# Patient Record
Sex: Male | Born: 1985
Health system: Southern US, Community
[De-identification: ages and names within clinical notes are randomized; demographics above are authoritative.]

## PROBLEM LIST (undated history)

## (undated) DIAGNOSIS — E871 Hypo-osmolality and hyponatremia: Secondary | ICD-10-CM

## (undated) DIAGNOSIS — M549 Dorsalgia, unspecified: Secondary | ICD-10-CM

## (undated) DIAGNOSIS — K922 Gastrointestinal hemorrhage, unspecified: Secondary | ICD-10-CM

## (undated) DIAGNOSIS — M5136 Other intervertebral disc degeneration, lumbar region: Secondary | ICD-10-CM

## (undated) DIAGNOSIS — M51369 Other intervertebral disc degeneration, lumbar region without mention of lumbar back pain or lower extremity pain: Secondary | ICD-10-CM

## (undated) DIAGNOSIS — Z0189 Encounter for other specified special examinations: Secondary | ICD-10-CM

## (undated) DIAGNOSIS — G629 Polyneuropathy, unspecified: Secondary | ICD-10-CM

## (undated) DIAGNOSIS — F1010 Alcohol abuse, uncomplicated: Secondary | ICD-10-CM

## (undated) DIAGNOSIS — U071 COVID-19: Secondary | ICD-10-CM

## (undated) DIAGNOSIS — K921 Melena: Secondary | ICD-10-CM

## (undated) DIAGNOSIS — E119 Type 2 diabetes mellitus without complications: Secondary | ICD-10-CM

## (undated) DIAGNOSIS — E876 Hypokalemia: Secondary | ICD-10-CM

---
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
## 2005-07-09 ENCOUNTER — Emergency Department (HOSPITAL_COMMUNITY): Admission: EM | Admit: 2005-07-09 | Discharge: 2005-07-09 | Payer: Self-pay | Admitting: Emergency Medicine

## 2008-09-22 ENCOUNTER — Ambulatory Visit (HOSPITAL_COMMUNITY): Admission: RE | Admit: 2008-09-22 | Discharge: 2008-09-22 | Payer: Self-pay | Admitting: Family Medicine

## 2009-02-02 ENCOUNTER — Ambulatory Visit (HOSPITAL_COMMUNITY): Admission: RE | Admit: 2009-02-02 | Discharge: 2009-02-02 | Payer: Self-pay | Admitting: Family Medicine

## 2010-05-03 ENCOUNTER — Emergency Department (HOSPITAL_COMMUNITY): Admission: EM | Admit: 2010-05-03 | Discharge: 2010-05-03 | Payer: Self-pay | Admitting: Emergency Medicine

## 2010-12-23 ENCOUNTER — Encounter: Payer: Self-pay | Admitting: Internal Medicine

## 2011-09-24 ENCOUNTER — Other Ambulatory Visit (HOSPITAL_COMMUNITY): Payer: Self-pay | Admitting: Family Medicine

## 2011-09-24 ENCOUNTER — Ambulatory Visit (HOSPITAL_COMMUNITY)
Admission: RE | Admit: 2011-09-24 | Discharge: 2011-09-24 | Disposition: A | Discharge status: Home / Self Care | Payer: BC Managed Care – PPO | Source: Ambulatory Visit | Attending: Family Medicine | Admitting: Family Medicine

## 2011-09-24 DIAGNOSIS — R059 Cough, unspecified: Secondary | ICD-10-CM | POA: Insufficient documentation

## 2011-09-24 DIAGNOSIS — F17200 Nicotine dependence, unspecified, uncomplicated: Secondary | ICD-10-CM | POA: Insufficient documentation

## 2011-09-24 DIAGNOSIS — J069 Acute upper respiratory infection, unspecified: Secondary | ICD-10-CM

## 2011-09-24 DIAGNOSIS — J209 Acute bronchitis, unspecified: Secondary | ICD-10-CM

## 2011-09-24 DIAGNOSIS — R05 Cough: Secondary | ICD-10-CM | POA: Insufficient documentation

## 2011-09-24 DIAGNOSIS — R509 Fever, unspecified: Secondary | ICD-10-CM | POA: Insufficient documentation

## 2011-11-22 ENCOUNTER — Emergency Department (HOSPITAL_COMMUNITY)
Admission: EM | Admit: 2011-11-22 | Discharge: 2011-11-22 | Discharge status: Against Medical Advice | Payer: BC Managed Care – PPO | Attending: Emergency Medicine | Admitting: Emergency Medicine

## 2011-11-22 ENCOUNTER — Encounter: Payer: Self-pay | Admitting: Emergency Medicine

## 2011-11-22 DIAGNOSIS — M549 Dorsalgia, unspecified: Secondary | ICD-10-CM | POA: Insufficient documentation

## 2011-11-22 HISTORY — DX: Dorsalgia, unspecified: M54.9

## 2011-11-22 NOTE — ED Notes (Signed)
Pt c/o chronic back pain. Pt states he needs med refill on hydrocodone 10/325mg .

## 2011-12-17 ENCOUNTER — Encounter (HOSPITAL_COMMUNITY): Payer: Self-pay | Admitting: *Deleted

## 2011-12-17 ENCOUNTER — Emergency Department (HOSPITAL_COMMUNITY)
Admission: EM | Admit: 2011-12-17 | Discharge: 2011-12-17 | Disposition: A | Discharge status: Home / Self Care | Payer: BC Managed Care – PPO | Attending: Emergency Medicine | Admitting: Emergency Medicine

## 2011-12-17 DIAGNOSIS — R509 Fever, unspecified: Secondary | ICD-10-CM | POA: Insufficient documentation

## 2011-12-17 DIAGNOSIS — J329 Chronic sinusitis, unspecified: Secondary | ICD-10-CM | POA: Insufficient documentation

## 2011-12-17 DIAGNOSIS — J45909 Unspecified asthma, uncomplicated: Secondary | ICD-10-CM | POA: Insufficient documentation

## 2011-12-17 DIAGNOSIS — M545 Low back pain, unspecified: Secondary | ICD-10-CM | POA: Insufficient documentation

## 2011-12-17 DIAGNOSIS — J3489 Other specified disorders of nose and nasal sinuses: Secondary | ICD-10-CM | POA: Insufficient documentation

## 2011-12-17 MED ORDER — AMOXICILLIN 500 MG PO CAPS: 500.0000 mg | ORAL_CAPSULE | Freq: Three times a day (TID) | ORAL | Status: AC

## 2011-12-17 MED ORDER — PREDNISONE 20 MG PO TABS: 20.0000 mg | ORAL_TABLET | Freq: Two times a day (BID) | ORAL | Status: DC

## 2011-12-17 MED ORDER — HYDROCODONE-ACETAMINOPHEN 5-325 MG PO TABS: 2.0000 | ORAL_TABLET | Freq: Four times a day (QID) | ORAL | Status: AC | PRN

## 2011-12-17 NOTE — ED Notes (Signed)
C/o sinus pressure to cheeks and c/o pain around eyes, tried Sudafed without relief

## 2011-12-17 NOTE — ED Provider Notes (Signed)
History     CSN: [id number]  Arrival date & time 12/17/11  1609   First MD Initiated Contact with Patient 12/17/11 1626      Chief Complaint  Patient presents with  . Sinusitis  . Back Pain    (Consider location/radiation/quality/duration/timing/severity/associated sxs/prior treatment) Patient is a 26 y.o. male presenting with sinusitis and back pain. The history is provided by the patient (the patient complains of about 3 or 4 day history of severe sinus pressure and pain. The pain is in his left maxillary sinus area. He also states that he's run out of his pain medicine for his back and in his in the process of changing physicians    ).  Sinusitis  This is a recurrent problem. The current episode started more than 2 days ago. The problem has not changed since onset.The maximum temperature recorded prior to his arrival was 100 to 100.9 F. The pain is at a severity of 5/10. The pain is moderate. The pain has been constant since onset. Associated symptoms include congestion. Pertinent negatives include no chills, no sweats, no sinus pressure and no cough. He has tried nothing for the symptoms. The treatment provided no relief.  Back Pain  Pertinent negatives include no chest pain, no headaches and no abdominal pain.    Past Medical History  Diagnosis Date  . Back pain   . Asthma     History reviewed. No pertinent past surgical history.  History reviewed. No pertinent family history.  History  Substance Use Topics  . Smoking status: Current Everyday Smoker -- 1.0 packs/day    Types: Cigarettes  . Smokeless tobacco: Not on file  . Alcohol Use: No      Review of Systems  Constitutional: Negative for chills and fatigue.  HENT: Positive for congestion. Negative for sinus pressure and ear discharge.        Sinus congestion  Eyes: Negative for discharge.  Respiratory: Negative for cough.   Cardiovascular: Negative for chest pain.  Gastrointestinal: Negative for abdominal  pain and diarrhea.  Genitourinary: Negative for frequency and hematuria.  Musculoskeletal: Positive for back pain.  Skin: Negative for rash.  Neurological: Negative for seizures and headaches.  Hematological: Negative.   Psychiatric/Behavioral: Negative for hallucinations.    Allergies  Review of patient's allergies indicates no known allergies.  Home Medications   Current Outpatient Rx  Name Route Sig Dispense Refill  . AMOXICILLIN 500 MG PO CAPS Oral Take 1 capsule (500 mg total) by mouth 3 (three) times daily. 42 capsule 0  . HYDROCODONE-ACETAMINOPHEN 5-325 MG PO TABS Oral Take 2 tablets by mouth every 6 (six) hours as needed for pain. 20 tablet 0  . PREDNISONE 20 MG PO TABS Oral Take 1 tablet (20 mg total) by mouth 2 (two) times daily. 14 tablet 0    BP 119/77  Pulse 93  Temp(Src) 98 F (36.7 C) (Oral)  Resp 20  Ht 5\' 11"  (1.803 m)  Wt 130 lb (58.968 kg)  BMI 18.13 kg/m2  SpO2 100%  Physical Exam  Constitutional: He is oriented to person, place, and time. He appears well-developed.  HENT:  Head: Normocephalic and atraumatic.       Tender left maxillary sinus  Eyes: Conjunctivae and EOM are normal. No scleral icterus.  Neck: Neck supple. No thyromegaly present.  Cardiovascular: Normal rate and regular rhythm.  Exam reveals no gallop and no friction rub.   No murmur heard. Pulmonary/Chest: No stridor. He has no wheezes. He has no  rales. He exhibits no tenderness.  Abdominal: He exhibits no distension. There is no tenderness. There is no rebound.  Musculoskeletal: Normal range of motion. He exhibits tenderness. He exhibits no edema.       Lumbar muscles mildly tender  Lymphadenopathy:    He has no cervical adenopathy.  Neurological: He is oriented to person, place, and time. Coordination normal.  Skin: No rash noted. No erythema.  Psychiatric: He has a normal mood and affect. His behavior is normal.    ED Course  Procedures (including critical care time)  Labs  Reviewed - No data to display No results found.   1. Sinusitis    Back pain   MDM          Benny Lennert, MD 12/17/11 1644

## 2012-01-06 ENCOUNTER — Emergency Department (HOSPITAL_COMMUNITY)
Admission: EM | Admit: 2012-01-06 | Discharge: 2012-01-06 | Disposition: A | Discharge status: Home / Self Care | Payer: BC Managed Care – PPO | Attending: Emergency Medicine | Admitting: Emergency Medicine

## 2012-01-06 ENCOUNTER — Encounter (HOSPITAL_COMMUNITY): Payer: Self-pay | Admitting: *Deleted

## 2012-01-06 DIAGNOSIS — J45909 Unspecified asthma, uncomplicated: Secondary | ICD-10-CM | POA: Insufficient documentation

## 2012-01-06 DIAGNOSIS — F17200 Nicotine dependence, unspecified, uncomplicated: Secondary | ICD-10-CM | POA: Insufficient documentation

## 2012-01-06 DIAGNOSIS — J329 Chronic sinusitis, unspecified: Secondary | ICD-10-CM | POA: Insufficient documentation

## 2012-01-06 MED ORDER — AMOXICILLIN-POT CLAVULANATE 875-125 MG PO TABS: 1.0000 | ORAL_TABLET | Freq: Two times a day (BID) | ORAL | Status: AC

## 2012-01-06 MED ORDER — FLUTICASONE PROPIONATE 50 MCG/ACT NA SUSP: 2.0000 | Freq: Every day | NASAL | Status: DC

## 2012-01-06 MED ORDER — HYDROCODONE-ACETAMINOPHEN 5-325 MG PO TABS
1.0000 | ORAL_TABLET | Freq: Once | ORAL | Status: AC
  Administered 2012-01-06: 1 via ORAL
  Filled 2012-01-06: qty 1

## 2012-01-06 MED ORDER — HYDROCODONE-ACETAMINOPHEN 5-325 MG PO TABS: 1.0000 | ORAL_TABLET | Freq: Four times a day (QID) | ORAL | Status: AC | PRN

## 2012-01-06 MED ORDER — AMOXICILLIN-POT CLAVULANATE 875-125 MG PO TABS
1.0000 | ORAL_TABLET | Freq: Once | ORAL | Status: AC
  Administered 2012-01-06: 1 via ORAL
  Filled 2012-01-06: qty 1

## 2012-01-06 NOTE — ED Notes (Signed)
Pain lt side of face for 2 days, says he felt like this when he has sinus infection.  Alert, NAD

## 2012-01-06 NOTE — ED Notes (Signed)
Facial pain, states he was treated for sinus infection recently

## 2012-01-06 NOTE — ED Provider Notes (Signed)
History     CSN: [id number]  Arrival date & time 01/06/12  1826   First MD Initiated Contact with Patient 01/06/12 1944      Chief Complaint  Patient presents with  . Sinusitis  . Facial Pain    (Consider location/radiation/quality/duration/timing/severity/associated sxs/prior treatment) HPI Comments: Was recently treated for a sinus infection with amoxicillin and "kind of got better"  sxs are now recurrin.  Pain especially in L max sinus area.  Patient is a 26 y.o. male presenting with sinusitis. The history is provided by the patient. No language interpreter was used.  Sinusitis  This is a new problem. The problem has been gradually worsening. There has been no fever. The pain is moderate. The pain has been constant since onset. Associated symptoms include sinus pressure. Pertinent negatives include no cough.    Past Medical History  Diagnosis Date  . Back pain   . Asthma     History reviewed. No pertinent past surgical history.  No family history on file.  History  Substance Use Topics  . Smoking status: Current Everyday Smoker -- 1.0 packs/day    Types: Cigarettes  . Smokeless tobacco: Not on file  . Alcohol Use: No      Review of Systems  Constitutional: Positive for fever.  HENT: Positive for sinus pressure.   Respiratory: Negative for cough and wheezing.   All other systems reviewed and are negative.    Allergies  Review of patient's allergies indicates no known allergies.  Home Medications   Current Outpatient Rx  Name Route Sig Dispense Refill  . ACETAMINOPHEN 500 MG PO TABS Oral Take 500 mg by mouth every 6 (six) hours as needed. For pain      BP 115/72  Pulse 89  Temp(Src) 98.6 F (37 C) (Oral)  Resp 20  Ht 5\' 7"  (1.702 m)  Wt 130 lb (58.968 kg)  BMI 20.36 kg/m2  SpO2 98%  Physical Exam  Nursing note and vitals reviewed. Constitutional: He is oriented to person, place, and time. He appears well-developed and well-nourished.  HENT:    Head: Normocephalic and atraumatic.    Right Ear: External ear normal.  Left Ear: External ear normal.  Mouth/Throat: Oropharynx is clear and moist. No oropharyngeal exudate.  Eyes: Conjunctivae and EOM are normal.  Neck: Normal range of motion.  Cardiovascular: Normal rate, regular rhythm, normal heart sounds and intact distal pulses.   Pulmonary/Chest: Effort normal and breath sounds normal. No respiratory distress. He has no wheezes. He has no rales. He exhibits no tenderness.  Abdominal: Soft. He exhibits no distension. There is no tenderness.  Musculoskeletal: Normal range of motion.  Neurological: He is alert and oriented to person, place, and time. He has normal strength. No cranial nerve deficit or sensory deficit. GCS eye subscore is 4. GCS verbal subscore is 5. GCS motor subscore is 6.  Skin: Skin is warm and dry.  Psychiatric: He has a normal mood and affect. Judgment normal.    ED Course  Procedures (including critical care time)  Labs Reviewed - No data to display No results found.   No diagnosis found.    MDM          Worthy Rancher, PA 01/06/12 [phone]

## 2012-01-07 NOTE — ED Provider Notes (Signed)
Medical screening examination/treatment/procedure(s) were performed by non-physician practitioner and as supervising physician I was immediately available for consultation/collaboration.   Vernella Niznik W Gottfried Standish, MD 01/07/12 0012 

## 2012-02-10 ENCOUNTER — Encounter (HOSPITAL_COMMUNITY): Payer: Self-pay | Admitting: *Deleted

## 2012-02-10 ENCOUNTER — Emergency Department (HOSPITAL_COMMUNITY)
Admission: EM | Admit: 2012-02-10 | Discharge: 2012-02-10 | Disposition: A | Discharge status: Home / Self Care | Payer: BC Managed Care – PPO | Attending: Emergency Medicine | Admitting: Emergency Medicine

## 2012-02-10 DIAGNOSIS — F17200 Nicotine dependence, unspecified, uncomplicated: Secondary | ICD-10-CM | POA: Insufficient documentation

## 2012-02-10 DIAGNOSIS — R51 Headache: Secondary | ICD-10-CM | POA: Insufficient documentation

## 2012-02-10 DIAGNOSIS — J329 Chronic sinusitis, unspecified: Secondary | ICD-10-CM

## 2012-02-10 MED ORDER — AMOXICILLIN-POT CLAVULANATE 875-125 MG PO TABS: 1.0000 | ORAL_TABLET | Freq: Two times a day (BID) | ORAL | Status: AC

## 2012-02-10 MED ORDER — HYDROCODONE-ACETAMINOPHEN 5-325 MG PO TABS: 1.0000 | ORAL_TABLET | ORAL | Status: AC | PRN

## 2012-02-10 NOTE — ED Notes (Signed)
Seen 02/03/12 for sinus infection, given abx.  Reports got better, but now c/o increased pressure in eyes/head/nose with swelling under left eye x 3 days.

## 2012-02-10 NOTE — ED Provider Notes (Signed)
History     CSN: [id number]  Arrival date & time 02/10/12  4098   First MD Initiated Contact with Patient 02/10/12 1250      Chief Complaint  Patient presents with  . Facial Swelling   HPI Shane Holmes is a 26 y.o. male who presents to the ED for sinus headache. Onset of symptoms 3 days ago with nasal congestion and pain around left eye. Hx of sinusitis in the past and makes it feel like teeth are hurting. The history was provided by the patient.  Past Medical History  Diagnosis Date  . Back pain   . Asthma     History reviewed. No pertinent past surgical history.  No family history on file.  History  Substance Use Topics  . Smoking status: Current Everyday Smoker -- 1.0 packs/day    Types: Cigarettes  . Smokeless tobacco: Not on file  . Alcohol Use: No      Review of Systems  Constitutional: Negative for fever, chills, diaphoresis and fatigue.  HENT: Positive for congestion and sinus pressure. Negative for ear pain, sore throat, facial swelling, neck pain, neck stiffness and dental problem.   Eyes: Negative for photophobia, pain and discharge.  Respiratory: Negative for cough, chest tightness and wheezing.   Cardiovascular: Negative.   Gastrointestinal: Negative for nausea, vomiting, abdominal pain, diarrhea, constipation and abdominal distention.  Genitourinary: Negative for dysuria, frequency, flank pain and difficulty urinating.  Musculoskeletal: Positive for back pain. Negative for myalgias and gait problem.  Skin: Negative for color change and rash.  Neurological: Positive for headaches. Negative for dizziness, speech difficulty, weakness, light-headedness and numbness.  Psychiatric/Behavioral: Negative for confusion and agitation. The patient is not nervous/anxious.     Allergies  Review of patient's allergies indicates no known allergies.  Home Medications   Current Outpatient Rx  Name Route Sig Dispense Refill  . ACETAMINOPHEN 500 MG PO TABS Oral Take  500 mg by mouth every 6 (six) hours as needed. For pain    . FLUTICASONE PROPIONATE 50 MCG/ACT NA SUSP Nasal Place 2 sprays into the nose daily. 1 g 0    BP 126/82  Pulse 91  Temp(Src) 97.9 F (36.6 C) (Oral)  Resp 16  Ht 5\' 11"  (1.803 m)  Wt 135 lb (61.236 kg)  BMI 18.83 kg/m2  SpO2 100%  Physical Exam  Nursing note and vitals reviewed. Constitutional: He is oriented to person, place, and time. He appears well-developed and well-nourished. No distress.  HENT:  Head: Normocephalic.    Right Ear: Tympanic membrane normal.  Left Ear: Tympanic membrane normal.  Nose: Mucosal edema present.  Mouth/Throat: Uvula is midline and oropharynx is clear and moist.       Left maxillary sinus tenderness with palpation.  Eyes: Conjunctivae, EOM and lids are normal. Pupils are equal, round, and reactive to light.  Neck: Trachea normal and normal range of motion. Neck supple.  Cardiovascular: Normal rate.   Pulmonary/Chest: Effort normal.  Abdominal: Soft. There is no tenderness.  Musculoskeletal: Normal range of motion.  Neurological: He is alert and oriented to person, place, and time. No cranial nerve deficit.  Skin: Skin is warm and dry.  Psychiatric: He has a normal mood and affect. His behavior is normal. Judgment and thought content normal.   Assessment: Sinusitis  Plan:  Augmentin Rx   Hydrocodone Rx   Follow up with PCP  ED Course  Procedures    MDM          [facility]  Orlene Och, NP 02/10/12 1303

## 2012-02-10 NOTE — Discharge Instructions (Signed)
Sinusitis Sinusitis an infection of the air pockets (sinuses) in your face. This can cause puffiness (swelling). It can also cause drainage from your sinuses.   HOME CARE    Only take medicine as told by your doctor.   Drink enough fluids to keep your pee (urine) clear or pale yellow.   Apply moist heat or ice packs for pain relief.   Use salt (saline) nose sprays. The spray will wet the thick fluid in the nose. This can help the sinuses drain.  GET HELP RIGHT AWAY IF:    You have a fever.   Your baby is older than 3 months with a rectal temperature of 102 F (38.9 C) or higher.   Your baby is 3 months old or younger with a rectal temperature of 100.4 F (38 C) or higher.   The pain gets worse.   You get a very bad headache.   You keep throwing up (vomiting).   Your face gets puffy.  MAKE SURE YOU:    Understand these instructions.   Will watch your condition.   Will get help right away if you are not doing well or get worse.  Document Released: 05/06/2008 Document Revised: 11/07/2011 Document Reviewed: 05/06/2008 [employer] Patient Information 2012 [employer]. 

## 2012-02-10 NOTE — ED Provider Notes (Signed)
Medical screening examination/treatment/procedure(s) were performed by non-physician practitioner and as supervising physician I was immediately available for consultation/collaboration.   Dayton Bailiff, MD 02/10/12 1505

## 2012-03-09 ENCOUNTER — Emergency Department (HOSPITAL_COMMUNITY): Payer: BC Managed Care – PPO

## 2012-03-09 ENCOUNTER — Emergency Department (HOSPITAL_COMMUNITY)
Admission: EM | Admit: 2012-03-09 | Discharge: 2012-03-09 | Disposition: A | Discharge status: Home / Self Care | Payer: BC Managed Care – PPO | Attending: Emergency Medicine | Admitting: Emergency Medicine

## 2012-03-09 ENCOUNTER — Encounter (HOSPITAL_COMMUNITY): Payer: Self-pay | Admitting: *Deleted

## 2012-03-09 DIAGNOSIS — S5000XA Contusion of unspecified elbow, initial encounter: Secondary | ICD-10-CM | POA: Insufficient documentation

## 2012-03-09 DIAGNOSIS — M25529 Pain in unspecified elbow: Secondary | ICD-10-CM | POA: Insufficient documentation

## 2012-03-09 DIAGNOSIS — F17200 Nicotine dependence, unspecified, uncomplicated: Secondary | ICD-10-CM | POA: Insufficient documentation

## 2012-03-09 DIAGNOSIS — Y9372 Activity, wrestling: Secondary | ICD-10-CM | POA: Insufficient documentation

## 2012-03-09 DIAGNOSIS — S5001XA Contusion of right elbow, initial encounter: Secondary | ICD-10-CM

## 2012-03-09 DIAGNOSIS — W1809XA Striking against other object with subsequent fall, initial encounter: Secondary | ICD-10-CM | POA: Insufficient documentation

## 2012-03-09 MED ORDER — HYDROCODONE-ACETAMINOPHEN 5-325 MG PO TABS: 1.0000 | ORAL_TABLET | ORAL | Status: DC | PRN

## 2012-03-09 MED ORDER — IBUPROFEN 600 MG PO TABS: 600.0000 mg | ORAL_TABLET | Freq: Four times a day (QID) | ORAL | Status: DC | PRN

## 2012-03-09 NOTE — ED Provider Notes (Signed)
History     CSN: [id number]  Arrival date & time 03/09/12  1810   First MD Initiated Contact with Patient 03/09/12 1832      Chief Complaint  Patient presents with  . Arm Pain    (Consider location/radiation/quality/duration/timing/severity/associated sxs/prior treatment) Patient is a 26 y.o. male presenting with arm injury. The history is provided by the patient.  Arm Injury  Episode onset: 3 days ago. The incident occurred in the street. The injury mechanism was a fall. Context: Patient was playing wrestling with a friend when he fell backward hitting the ground with his right elbow and his friend fell across his forearm.  This injury occurred 3 days ago and he continues to have pain and bruising at the elbow. There is an injury to the right elbow. The pain is severe. Pertinent negatives include no chest pain, no numbness, no abdominal pain, no nausea, no headaches, no neck pain, no light-headedness and no weakness. He is right-handed.    Past Medical History  Diagnosis Date  . Back pain   . Asthma     History reviewed. No pertinent past surgical history.  History reviewed. No pertinent family history.  History  Substance Use Topics  . Smoking status: Current Everyday Smoker -- 1.0 packs/day    Types: Cigarettes  . Smokeless tobacco: Not on file  . Alcohol Use: No      Review of Systems  Constitutional: Negative for fever.  HENT: Negative for congestion, sore throat and neck pain.   Eyes: Negative.   Respiratory: Negative for chest tightness and shortness of breath.   Cardiovascular: Negative for chest pain.  Gastrointestinal: Negative for nausea and abdominal pain.  Genitourinary: Negative.   Musculoskeletal: Positive for arthralgias. Negative for joint swelling.  Skin: Negative.  Negative for rash and wound.  Neurological: Negative for dizziness, weakness, light-headedness, numbness and headaches.  Hematological: Negative.   Psychiatric/Behavioral: Negative.      Allergies  Review of patient's allergies indicates no known allergies.  Home Medications   Current Outpatient Rx  Name Route Sig Dispense Refill  . IBUPROFEN 200 MG PO TABS Oral Take 800 mg by mouth as needed. FOR PAIN    . HYDROCODONE-ACETAMINOPHEN 5-325 MG PO TABS Oral Take 1 tablet by mouth every 4 (four) hours as needed for pain. 15 tablet 0  . IBUPROFEN 600 MG PO TABS Oral Take 1 tablet (600 mg total) by mouth every 6 (six) hours as needed for pain. 30 tablet 0    BP 141/97  Pulse 109  Temp(Src) 98.4 F (36.9 C) (Oral)  Resp 18  Ht 5\' 11"  (1.803 m)  Wt 135 lb (61.236 kg)  BMI 18.83 kg/m2  SpO2 100%  Physical Exam  Nursing note and vitals reviewed. Constitutional: He is oriented to person, place, and time. He appears well-developed and well-nourished.  HENT:  Head: Normocephalic.  Eyes: Conjunctivae are normal.  Neck: Normal range of motion.  Cardiovascular: Normal rate and intact distal pulses.  Exam reveals no decreased pulses.   Pulmonary/Chest: Effort normal.  Musculoskeletal: He exhibits tenderness. He exhibits no edema.       Right elbow: He exhibits decreased range of motion and swelling. He exhibits no effusion and no deformity. tenderness found. Medial epicondyle, lateral epicondyle and olecranon process tenderness noted.       Ecchymosis surrounding lateral and posterior elbow.  Neurological: He is alert and oriented to person, place, and time. No sensory deficit.  Skin: Skin is warm, dry and intact.  ED Course  Procedures (including critical care time)  Labs Reviewed - No data to display Dg Elbow Complete Right  03/09/2012  *RADIOLOGY REPORT*  Clinical Data: Pain, injury  RIGHT ELBOW - COMPLETE 3+ VIEW  Comparison: None.  Findings: Normal alignment without fracture or effusion.  Preserved joint spaces.  IMPRESSION: No acute finding.  Original Report Authenticated By: Judie Petit. TREVOR Miles Costain, M.D.     1. Contusion of right elbow       MDM  Splint and  Ace wrap applied.  Ibuprofen, hydrocodone, encouraged to continue using ice packs.  Will refer to Dr. Hilda Lias if his pain symptoms are not improved over the next week.  Work note given for light duty for the next week.  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        Candis Musa, PA 03/09/12 1939

## 2012-03-09 NOTE — Discharge Instructions (Signed)
Elbow Contusion Contusions are areas of tenderness and swelling in the soft tissues. They are the result of damage and bleeding in the injured area. Minor injuries will give you a painless bruise, but more severe contusions may stay painful and swollen for a few weeks. You have a deep bruise (contusion) of your elbow. SYMPTOMS  A hematoma may form in large contusions. A hematoma is a collection of blood in the deep tissues. Hematomas are usually reabsorbed by the body naturally. Sometimes they need to be drained.  TREATMENT  Only a sling or splint may be needed for a brief period of time or as directed by your caregiver.  HOME CARE INSTRUCTIONS   Only take over-the-counter or prescription medicines for pain, discomfort, or fever as directed by your caregiver.   Rest the injured elbow until the pain and swelling are better.   Elevate the injury to reduce swelling.   Compression bandages also help reduce swelling and motion.   If you have a splint held on with an elastic wrap or a cast, watch your hand or fingers. If they become numb or cold and blue, loosen the wrap and reapply more loosely. See your caregiver if there is no relief.   You may use ice on your elbow for 20 minutes, 4 times per day, for the first 2 to 3 days.   Use your elbow as directed.   See your caregiver as directed. It is very important to keep all follow-up referrals and appointments in order to avoid any long-term problems with your elbow including chronic pain or inability to move the elbow normally.  SEEK IMMEDIATE MEDICAL CARE IF:   You show signs of infection (increased redness, swelling, or pain).   Swelling or increased pain in your elbow is not relieved with medications.   You have pain or inability to move your fingers or wrist.   You begin to lose feeling in your hand or fingers, or you develop swelling of the hand and fingers.   You get a cold or blue hand or fingers on the injured side.   If your  elbow remains sore, your caregiver may want to X-ray it again. A hairline fracture may not show up on the first X-rays and may only be seen on X-rays 10 days to 2 weeks later. A specialist (radiologist) may examine your X-rays at a later time. In order to get results from the X-rays, make sure you know how and when you are to get that information. It is your responsibility to get results of any tests you may have had.   Make sure you have a follow up exam with your caregiver.  MAKE SURE YOU:   Understand these instructions.   Will watch your condition.   Will get help right away if you are not doing well or get worse.  Document Released: 10/27/2006 Document Revised: 11/07/2011 Document Reviewed: 10/04/2011 [facility] Patient Information 2012 [place], [place].   You may take the hydrocodone as prescribed for pain relief.  This will make you drowsy - do not drive within 4 hours of taking this medication. Wear the Ace wrap and use a sling for comfort.  Continue using ice.  As discussed, get rechecked if your symptoms are not improving over the next week.  Your x-rays negative today.
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
## 2012-03-10 NOTE — ED Provider Notes (Signed)
Medical screening examination/treatment/procedure(s) were performed by non-physician practitioner and as supervising physician I was immediately available for consultation/collaboration. Devoria Albe, MD, FACEP   Ward Givens, MD 03/10/12 [phone]

## 2012-03-16 ENCOUNTER — Encounter (HOSPITAL_COMMUNITY): Payer: Self-pay | Admitting: *Deleted

## 2012-03-16 ENCOUNTER — Emergency Department (HOSPITAL_COMMUNITY): Payer: BC Managed Care – PPO

## 2012-03-16 ENCOUNTER — Emergency Department (HOSPITAL_COMMUNITY)
Admission: EM | Admit: 2012-03-16 | Discharge: 2012-03-16 | Disposition: A | Discharge status: Home / Self Care | Payer: BC Managed Care – PPO | Attending: Emergency Medicine | Admitting: Emergency Medicine

## 2012-03-16 DIAGNOSIS — M778 Other enthesopathies, not elsewhere classified: Secondary | ICD-10-CM | POA: Insufficient documentation

## 2012-03-16 DIAGNOSIS — M79609 Pain in unspecified limb: Secondary | ICD-10-CM | POA: Insufficient documentation

## 2012-03-16 DIAGNOSIS — M79601 Pain in right arm: Secondary | ICD-10-CM

## 2012-03-16 DIAGNOSIS — F17200 Nicotine dependence, unspecified, uncomplicated: Secondary | ICD-10-CM | POA: Insufficient documentation

## 2012-03-16 DIAGNOSIS — R5381 Other malaise: Secondary | ICD-10-CM | POA: Insufficient documentation

## 2012-03-16 DIAGNOSIS — IMO0001 Reserved for inherently not codable concepts without codable children: Secondary | ICD-10-CM | POA: Insufficient documentation

## 2012-03-16 DIAGNOSIS — Z87828 Personal history of other (healed) physical injury and trauma: Secondary | ICD-10-CM | POA: Insufficient documentation

## 2012-03-16 DIAGNOSIS — M25529 Pain in unspecified elbow: Secondary | ICD-10-CM | POA: Insufficient documentation

## 2012-03-16 MED ORDER — TRAMADOL HCL 50 MG PO TABS: 50.0000 mg | ORAL_TABLET | Freq: Four times a day (QID) | ORAL | Status: AC | PRN

## 2012-03-16 MED ORDER — PREDNISONE 10 MG PO TABS: ORAL_TABLET | ORAL | Status: DC

## 2012-03-16 NOTE — Discharge Instructions (Signed)
Tendinitis  Tendinitis is redness, soreness, and puffiness (inflammation) of the tendons. Tendons are band-like tissues that connect muscle to bone. Tendinitis often happens in the shoulders, heels, or elbows. It might happen if your job involves doing the same motions over and over. HOME CARE  Use a sling or splint as told by your doctor.   Put ice on the injured area.   Put ice in a plastic bag.   Place a towel between your skin and the bag.   Leave the ice on for 15 to 20 minutes, 3 to 4 times a day.   Avoid using your injured arm or leg until the pain goes away.   Do gentle exercises only as told by your doctor. Stop exercises if the pain gets worse, unless your doctor tells you otherwise.   Only take medicines as told by your doctor.  GET HELP RIGHT AWAY IF:  Your pain and puffiness get worse.   You have new problems, such as loss of feeling (numbness) in the hands.  MAKE SURE YOU:  Understand these instructions.   Will watch your condition.   Will get help right away if you are not doing well or get worse.  Document Released: 02/28/2011 Document Revised: 11/07/2011 Document Reviewed: 02/28/2011 [name] Patient Information 2012 [place], [place].  Use the prednisone taper in place of ibuprofen.  ACE your elbow since his discomfort but I would my use of your sling time can actually cause increased weakness and muscle atrophy in your arm shoulder area.  You need to call Dr. Hilda Lias for further management of your pain if it persists.

## 2012-03-16 NOTE — ED Notes (Signed)
Pain rt arm, esp at elbow, Seen here for same..  Wearing a sling on arrival.

## 2012-03-16 NOTE — ED Provider Notes (Signed)
Medical screening examination/treatment/procedure(s) were performed by non-physician practitioner and as supervising physician I was immediately available for consultation/collaboration.  Donnetta Hutching, MD 03/16/12 [phone]
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
## 2012-03-16 NOTE — ED Provider Notes (Signed)
History     CSN: [id number]  Arrival date & time 03/16/12  1057   First MD Initiated Contact with Patient 03/16/12 1320      Chief Complaint  Patient presents with  . Arm Pain    (Consider location/radiation/quality/duration/timing/severity/associated sxs/prior treatment) HPI Comments: Patient was seen here one week ago for evaluation of right elbow pain an injury when he bowel and a friend fell on top of him jamming his elbow into the pavement.  X-rays at that time were negative.  He continues to have pain.  He was referred to Dr. Hilda Lias if his pain persisted, but he has not called for this appointment yet.  Patient is a 26 y.o. male presenting with arm pain. The history is provided by the patient.  Arm Pain This is a recurrent problem. Episode onset: 10 days ago. The problem occurs constantly. The problem has been unchanged. Associated symptoms include arthralgias, myalgias and weakness. Pertinent negatives include no fatigue, fever, headaches, joint swelling, nausea or numbness. The symptoms are aggravated by twisting and bending (Palpation). Treatments tried: He has been wearing a sling and Ace wrap and has completed a course of ibuprofen and hydrocodone without relief of his pain. The treatment provided no relief.    Past Medical History  Diagnosis Date  . Back pain   . Asthma     History reviewed. No pertinent past surgical history.  History reviewed. No pertinent family history.  History  Substance Use Topics  . Smoking status: Current Everyday Smoker -- 1.0 packs/day    Types: Cigarettes  . Smokeless tobacco: Not on file  . Alcohol Use: No      Review of Systems  Constitutional: Negative for fever and fatigue.  Gastrointestinal: Negative for nausea.  Musculoskeletal: Positive for myalgias and arthralgias. Negative for joint swelling.  Neurological: Positive for weakness. Negative for numbness and headaches.    Allergies  Review of patient's allergies  indicates no known allergies.  Home Medications   Current Outpatient Rx  Name Route Sig Dispense Refill  . IBUPROFEN 200 MG PO TABS Oral Take 600 mg by mouth as needed. FOR PAIN      BP 121/75  Pulse 68  Temp(Src) 98 F (36.7 C) (Oral)  Resp 18  Ht 5\' 11"  (1.803 m)  Wt 135 lb (61.236 kg)  BMI 18.83 kg/m2  SpO2 100%  Physical Exam  Nursing note and vitals reviewed. Constitutional: He is oriented to person, place, and time. He appears well-developed and well-nourished.  HENT:  Head: Normocephalic.  Eyes: Conjunctivae are normal.  Neck: Normal range of motion.  Cardiovascular: Normal rate and intact distal pulses.  Exam reveals no decreased pulses.   Pulses:      Dorsalis pedis pulses are 2+ on the right side, and 2+ on the left side.       Posterior tibial pulses are 2+ on the right side, and 2+ on the left side.  Pulmonary/Chest: Effort normal.  Musculoskeletal: He exhibits tenderness. He exhibits no edema.       Right elbow: He exhibits normal range of motion, no swelling, no effusion, no deformity and no laceration. tenderness found. Olecranon process tenderness noted.       Right ankle: He exhibits normal pulse.       Also with tenderness to palpation to the mid posterior humeral shaft.  No muscle spasm, ecchymosis or hematoma appreciated.  No deformity appreciated.  Patient has increased pain in his posterior upper arm with attempts at resisted extension.  Neurological: He is alert and oriented to person, place, and time. No sensory deficit.  Skin: Skin is warm, dry and intact.    ED Course  Procedures (including critical care time)  Labs Reviewed - No data to display Dg Humerus Right  03/16/2012  *RADIOLOGY REPORT*  Clinical Data: Arm pain  RIGHT HUMERUS - 2+ VIEW  Comparison: 03/09/12  Findings: .  Four views of the right humerus submitted.  No acute fracture or subluxation.  No posterior fat pad sign.  No periosteal reaction or bony erosion.  IMPRESSION: No acute  fracture or subluxation.  Original Report Authenticated By: Natasha Mead, M.D.     1. Right arm pain       MDM  Patient presents with continuing pain after trauma to his right elbow and right upper arm despite normal x-ray findings.  He will once again be referred to orthopedics for further management if his pain persists.  Encouraged to continue using Ace wrap but discouraged continued use of his sling.  Will try prednisone taper in place of his ibuprofen and will prescribe tramadol for pain relief.  Suspect he may have tricep tendon injury or tendinitis.  No evidence for tendon rupture.  Urged to call Dr. Hilda Lias for further management.        Candis Musa, PA 03/16/12 1445

## 2012-12-20 ENCOUNTER — Emergency Department (HOSPITAL_COMMUNITY)
Admission: EM | Admit: 2012-12-20 | Discharge: 2012-12-20 | Disposition: A | Discharge status: Home / Self Care | Payer: Self-pay | Attending: Emergency Medicine | Admitting: Emergency Medicine

## 2012-12-20 ENCOUNTER — Emergency Department (HOSPITAL_COMMUNITY): Payer: Self-pay

## 2012-12-20 ENCOUNTER — Encounter (HOSPITAL_COMMUNITY): Payer: Self-pay | Admitting: Family Medicine

## 2012-12-20 DIAGNOSIS — Z79899 Other long term (current) drug therapy: Secondary | ICD-10-CM | POA: Insufficient documentation

## 2012-12-20 DIAGNOSIS — R071 Chest pain on breathing: Secondary | ICD-10-CM | POA: Insufficient documentation

## 2012-12-20 DIAGNOSIS — R05 Cough: Secondary | ICD-10-CM | POA: Insufficient documentation

## 2012-12-20 DIAGNOSIS — R059 Cough, unspecified: Secondary | ICD-10-CM | POA: Insufficient documentation

## 2012-12-20 DIAGNOSIS — F17200 Nicotine dependence, unspecified, uncomplicated: Secondary | ICD-10-CM | POA: Insufficient documentation

## 2012-12-20 DIAGNOSIS — IMO0001 Reserved for inherently not codable concepts without codable children: Secondary | ICD-10-CM | POA: Insufficient documentation

## 2012-12-20 DIAGNOSIS — J45909 Unspecified asthma, uncomplicated: Secondary | ICD-10-CM | POA: Insufficient documentation

## 2012-12-20 DIAGNOSIS — R0789 Other chest pain: Secondary | ICD-10-CM

## 2012-12-20 DIAGNOSIS — Z791 Long term (current) use of non-steroidal anti-inflammatories (NSAID): Secondary | ICD-10-CM | POA: Insufficient documentation

## 2012-12-20 MED ORDER — HYDROCODONE-ACETAMINOPHEN 7.5-325 MG/15ML PO SOLN: 10.0000 mL | Freq: Four times a day (QID) | ORAL | Status: DC | PRN

## 2012-12-20 MED ORDER — HYDROCOD POLST-CHLORPHEN POLST 10-8 MG/5ML PO LQCR
5.0000 mL | Freq: Once | ORAL | Status: AC
  Administered 2012-12-20: 5 mL via ORAL
  Filled 2012-12-20: qty 5

## 2012-12-20 NOTE — ED Notes (Addendum)
Per pt sts right rib pain x 5 days. Recently got over a cold and has been coughing a lot. sts hurts to move and take a deep breath.

## 2012-12-20 NOTE — ED Notes (Signed)
Patient transported to X-ray 

## 2012-12-20 NOTE — ED Provider Notes (Signed)
History     CSN: [id number]  Arrival date & time 12/20/12  1220   First MD Initiated Contact with Patient 12/20/12 1247      Chief Complaint  Patient presents with  . Rib Injury    (Consider location/radiation/quality/duration/timing/severity/associated sxs/prior treatment) HPI 27 year old caucasian male with history of asthma and smoking complaining of right sided rib pain and persistent cough x 5 days occuring after resolution of viral illness. Pain is in right lower ribs running from sternum to back. Described as sharp 10/10 at worst. Is aggrivated with movement, deep breathing, and coughing. Was relieved with Vicodin. Denies SOB, wheezing, leg swelling, nausea, or vomiting.  Past Medical History  Diagnosis Date  . Back pain   . Asthma     History reviewed. No pertinent past surgical history.  History reviewed. No pertinent family history.  History  Substance Use Topics  . Smoking status: Current Every Day Smoker -- 1.0 packs/day    Types: Cigarettes  . Smokeless tobacco: Not on file  . Alcohol Use: No      Review of Systems  Constitutional: Negative for fever.  Respiratory: Positive for cough. Negative for chest tightness and shortness of breath.   Cardiovascular: Positive for chest pain.  Gastrointestinal: Negative for nausea and abdominal pain.  Musculoskeletal: Positive for myalgias.       Severe pain to lower ribs on right side that is aggravated with movement.    Allergies  Review of patient's allergies indicates no known allergies.  Home Medications   Current Outpatient Rx  Name  Route  Sig  Dispense  Refill  . ACETAMINOPHEN 500 MG PO TABS   Oral   Take 1,500-2,000 mg by mouth 2 (two) times daily as needed. For pain         . ALBUTEROL SULFATE HFA 108 (90 BASE) MCG/ACT IN AERS   Inhalation   Inhale 2 puffs into the lungs every 6 (six) hours as needed. For shortness of breath or wheezing         . IBUPROFEN 200 MG PO TABS   Oral   Take  600-800 mg by mouth 3 (three) times daily as needed. For pain           BP 125/94  Pulse 67  Temp 98.1 F (36.7 C)  Resp 18  SpO2 100%  Physical Exam  Constitutional: He appears well-developed and well-nourished.  Cardiovascular: Normal rate, regular rhythm and normal heart sounds.   Pulmonary/Chest: Effort normal and breath sounds normal. Not tachypneic. No respiratory distress. He exhibits tenderness.       Only on right side below 3rd rib.  Abdominal: Soft. There is no tenderness.  Musculoskeletal: He exhibits tenderness.       Right shoulder: He exhibits tenderness and pain.       Arms: Skin: No erythema.    ED Course  Procedures (including critical care time)  Labs Reviewed - No data to display No results found.   No diagnosis found. Chest wall pain.    MDM  Cough with subsequent chest pain associated. Negative xray, no fever. Doubt pneumonia. Pt not tachycardic, negative PERC: doubt PE. Treat as chest wall pain.        Arnoldo Hooker, PA-C 12/20/12 1433

## 2012-12-20 NOTE — ED Provider Notes (Signed)
Medical screening examination/treatment/procedure(s) were performed by non-physician practitioner and as supervising physician I was immediately available for consultation/collaboration.   Dione Booze, MD 12/20/12 1556

## 2014-03-02 ENCOUNTER — Emergency Department (HOSPITAL_COMMUNITY)
Admission: EM | Admit: 2014-03-02 | Discharge: 2014-03-02 | Disposition: A | Discharge status: Home / Self Care | Payer: BC Managed Care – PPO | Attending: Emergency Medicine | Admitting: Emergency Medicine

## 2014-03-02 ENCOUNTER — Encounter (HOSPITAL_COMMUNITY): Payer: Self-pay | Admitting: Emergency Medicine

## 2014-03-02 DIAGNOSIS — R05 Cough: Secondary | ICD-10-CM | POA: Insufficient documentation

## 2014-03-02 DIAGNOSIS — R109 Unspecified abdominal pain: Secondary | ICD-10-CM | POA: Insufficient documentation

## 2014-03-02 DIAGNOSIS — F17200 Nicotine dependence, unspecified, uncomplicated: Secondary | ICD-10-CM | POA: Insufficient documentation

## 2014-03-02 DIAGNOSIS — R059 Cough, unspecified: Secondary | ICD-10-CM | POA: Insufficient documentation

## 2014-03-02 DIAGNOSIS — J45909 Unspecified asthma, uncomplicated: Secondary | ICD-10-CM | POA: Insufficient documentation

## 2014-03-02 DIAGNOSIS — R198 Other specified symptoms and signs involving the digestive system and abdomen: Secondary | ICD-10-CM

## 2014-03-02 DIAGNOSIS — R112 Nausea with vomiting, unspecified: Secondary | ICD-10-CM

## 2014-03-02 LAB — CBC WITH DIFFERENTIAL/PLATELET
Basophils Absolute: 0 10*3/uL (ref 0.0–0.1)
Basophils Relative: 0 % (ref 0–1)
Eosinophils Absolute: 0 10*3/uL (ref 0.0–0.7)
Eosinophils Relative: 0 % (ref 0–5)
HCT: 50.8 % (ref 39.0–52.0)
HEMOGLOBIN: 17.6 g/dL — AB (ref 13.0–17.0)
LYMPHS ABS: 1.2 10*3/uL (ref 0.7–4.0)
LYMPHS PCT: 14 % (ref 12–46)
MCH: 28.6 pg (ref 26.0–34.0)
MCHC: 34.6 g/dL (ref 30.0–36.0)
MCV: 82.5 fL (ref 78.0–100.0)
MONOS PCT: 7 % (ref 3–12)
Monocytes Absolute: 0.6 10*3/uL (ref 0.1–1.0)
NEUTROS PCT: 79 % — AB (ref 43–77)
Neutro Abs: 6.6 10*3/uL (ref 1.7–7.7)
PLATELETS: 189 10*3/uL (ref 150–400)
RBC: 6.16 MIL/uL — AB (ref 4.22–5.81)
RDW: 15.4 % (ref 11.5–15.5)
WBC: 8.5 10*3/uL (ref 4.0–10.5)

## 2014-03-02 LAB — COMPREHENSIVE METABOLIC PANEL
ALK PHOS: 107 U/L (ref 39–117)
ALT: 49 U/L (ref 0–53)
AST: 55 U/L — ABNORMAL HIGH (ref 0–37)
Albumin: 4.2 g/dL (ref 3.5–5.2)
BILIRUBIN TOTAL: 0.9 mg/dL (ref 0.3–1.2)
BUN: 7 mg/dL (ref 6–23)
CHLORIDE: 101 meq/L (ref 96–112)
CO2: 23 meq/L (ref 19–32)
Calcium: 9.8 mg/dL (ref 8.4–10.5)
Creatinine, Ser: 0.78 mg/dL (ref 0.50–1.35)
GLUCOSE: 123 mg/dL — AB (ref 70–99)
POTASSIUM: 4.2 meq/L (ref 3.7–5.3)
SODIUM: 139 meq/L (ref 137–147)
TOTAL PROTEIN: 7.5 g/dL (ref 6.0–8.3)

## 2014-03-02 LAB — LIPASE, BLOOD: Lipase: 32 U/L (ref 11–59)

## 2014-03-02 MED ORDER — HYDROCODONE-ACETAMINOPHEN 5-325 MG PO TABS: 1.0000 | ORAL_TABLET | ORAL | Status: DC | PRN

## 2014-03-02 MED ORDER — DICYCLOMINE HCL 20 MG PO TABS: 20.0000 mg | ORAL_TABLET | Freq: Two times a day (BID) | ORAL | Status: DC

## 2014-03-02 MED ORDER — GI COCKTAIL ~~LOC~~
30.0000 mL | Freq: Once | ORAL | Status: AC
  Administered 2014-03-02: 30 mL via ORAL
  Filled 2014-03-02: qty 30

## 2014-03-02 MED ORDER — ONDANSETRON HCL 4 MG PO TABS: 4.0000 mg | ORAL_TABLET | Freq: Four times a day (QID) | ORAL | Status: DC

## 2014-03-02 MED ORDER — DICYCLOMINE HCL 10 MG PO CAPS
10.0000 mg | ORAL_CAPSULE | Freq: Once | ORAL | Status: AC
  Administered 2014-03-02: 10 mg via ORAL
  Filled 2014-03-02: qty 1

## 2014-03-02 MED ORDER — ONDANSETRON HCL 4 MG/2ML IJ SOLN
4.0000 mg | Freq: Once | INTRAMUSCULAR | Status: AC
  Administered 2014-03-02: 4 mg via INTRAVENOUS
  Filled 2014-03-02: qty 2

## 2014-03-02 MED ORDER — SODIUM CHLORIDE 0.9 % IV BOLUS (SEPSIS)
1000.0000 mL | Freq: Once | INTRAVENOUS | Status: AC
  Administered 2014-03-02: 1000 mL via INTRAVENOUS

## 2014-03-02 MED ORDER — MORPHINE SULFATE 4 MG/ML IJ SOLN
4.0000 mg | Freq: Once | INTRAMUSCULAR | Status: AC
  Administered 2014-03-02: 4 mg via INTRAVENOUS
  Filled 2014-03-02: qty 1

## 2014-03-02 NOTE — ED Notes (Signed)
Patient complains that last night after dinner he started to vomit and get "a splitting headache." He has had the headache all night and has been vomiting through the night. Patient reports "running a fever." Patient reports waking up sweating and having chills.

## 2014-03-02 NOTE — Discharge Instructions (Signed)
Abdominal (belly) pain can be caused by many things. Your caregiver performed an examination and possibly ordered blood/urine tests and imaging (CT scan, x-rays, ultrasound). Many cases can be observed and treated at home after initial evaluation in the emergency department. Even though you are being discharged home, abdominal pain can be unpredictable. Therefore, you need a repeated exam if your pain does not resolve, returns, or worsens. Most patients with abdominal pain don't have to be admitted to the hospital or have surgery, but serious problems like appendicitis and gallbladder attacks can start out as nonspecific pain. Many abdominal conditions cannot be diagnosed in one visit, so follow-up evaluations are very important. SEEK IMMEDIATE MEDICAL ATTENTION IF: The pain does not go away or becomes severe.  A temperature above 101 develops.  Repeated vomiting occurs (multiple episodes).  The pain becomes localized to portions of the abdomen. The right side could possibly be appendicitis. In an adult, the left lower portion of the abdomen could be colitis or diverticulitis.  Blood is being passed in stools or vomit (bright red or black tarry stools).  Return also if you develop chest pain, difficulty breathing, dizziness or fainting, or become confused, poorly responsive, or inconsolable (young children).  Nausea and Vomiting Nausea is a sick feeling that often comes before throwing up (vomiting). Vomiting is a reflex where stomach contents come out of your mouth. Vomiting can cause severe loss of body fluids (dehydration). Children and elderly adults can become dehydrated quickly, especially if they also have diarrhea. Nausea and vomiting are symptoms of a condition or disease. It is important to find the cause of your symptoms. CAUSES   Direct irritation of the stomach lining. This irritation can result from increased acid production (gastroesophageal reflux disease), infection, food poisoning,  taking certain medicines (such as nonsteroidal anti-inflammatory drugs), alcohol use, or tobacco use.  Signals from the brain.These signals could be caused by a headache, heat exposure, an inner ear disturbance, increased pressure in the brain from injury, infection, a tumor, or a concussion, pain, emotional stimulus, or metabolic problems.  An obstruction in the gastrointestinal tract (bowel obstruction).  Illnesses such as diabetes, hepatitis, gallbladder problems, appendicitis, kidney problems, cancer, sepsis, atypical symptoms of a heart attack, or eating disorders.  Medical treatments such as chemotherapy and radiation.  Receiving medicine that makes you sleep (general anesthetic) during surgery. DIAGNOSIS Your caregiver may ask for tests to be done if the problems do not improve after a few days. Tests may also be done if symptoms are severe or if the reason for the nausea and vomiting is not clear. Tests may include:  Urine tests.  Blood tests.  Stool tests.  Cultures (to look for evidence of infection).  X-rays or other imaging studies. Test results can help your caregiver make decisions about treatment or the need for additional tests. TREATMENT You need to stay well hydrated. Drink frequently but in small amounts.You may wish to drink water, sports drinks, clear broth, or eat frozen ice pops or gelatin dessert to help stay hydrated.When you eat, eating slowly may help prevent nausea.There are also some antinausea medicines that may help prevent nausea. HOME CARE INSTRUCTIONS   Take all medicine as directed by your caregiver.  If you do not have an appetite, do not force yourself to eat. However, you must continue to drink fluids.  If you have an appetite, eat a normal diet unless your caregiver tells you differently.  Eat a variety of complex carbohydrates (rice, wheat, potatoes, bread), lean  meats, yogurt, fruits, and vegetables.  Avoid high-fat foods because they  are more difficult to digest.  Drink enough water and fluids to keep your urine clear or pale yellow.  If you are dehydrated, ask your caregiver for specific rehydration instructions. Signs of dehydration may include:  Severe thirst.  Dry lips and mouth.  Dizziness.  Dark urine.  Decreasing urine frequency and amount.  Confusion.  Rapid breathing or pulse. SEEK IMMEDIATE MEDICAL CARE IF:   You have blood or brown flecks (like coffee grounds) in your vomit.  You have black or bloody stools.  You have a severe headache or stiff neck.  You are confused.  You have severe abdominal pain.  You have chest pain or trouble breathing.  You do not urinate at least once every 8 hours.  You develop cold or clammy skin.  You continue to vomit for longer than 24 to 48 hours.  You have a fever. MAKE SURE YOU:   Understand these instructions.  Will watch your condition.  Will get help right away if you are not doing well or get worse. Document Released: 11/18/2005 Document Revised: 02/10/2012 Document Reviewed: 04/17/2011 [facility] Patient Information 2014 [employer] [place].

## 2014-03-02 NOTE — ED Provider Notes (Signed)
CSN: [id number]     Arrival date & time 03/02/14  0901 History   First MD Initiated Contact with Patient 03/02/14 [phone]     Chief Complaint  Patient presents with  . Emesis     (Consider location/radiation/quality/duration/timing/severity/associated sxs/prior Treatment) HPI Raeford RazorMilton L Gaxiola 28 year old male who presents the emergency department with chief complaint of vomiting.  Patient states he had a normal day yesterday and late last night he had sudden onset vomiting.  He states he vomited enumerable times. He denies any blood in his vomit or bilious vomitus.  The patient states he developed a cough last night and every time he coughs he vomits as well.  He complains of diffuse abdominal tenderness, no diarrhea, no contacts with similar symptoms, recent foreign travel, bloody bowel movements, ingestion of suspicious foods.  He denies any rash is, urinary symptoms, flank pain. Patient states he is still quite nauseated.  No history of abdominal surgeries.  Past Medical History  Diagnosis Date  . Back pain   . Asthma    History reviewed. No pertinent past surgical history. History reviewed. No pertinent family history. History  Substance Use Topics  . Smoking status: Current Some Day Smoker    Types: Cigarettes  . Smokeless tobacco: Not on file  . Alcohol Use: 3.6 oz/week    6 Cans of beer per week    Review of Systems  Ten systems reviewed and are negative for acute change, except as noted in the HPI.    Allergies  Review of patient's allergies indicates no known allergies.  Home Medications  No current outpatient prescriptions on file. BP 130/85  Pulse 82  Temp(Src) 98.5 F (36.9 C) (Oral)  Resp 18  Ht 5\' 11"  (1.803 m)  Wt 160 lb (72.576 kg)  BMI 22.33 kg/m2  SpO2 98% Physical Exam Physical Exam  Nursing note and vitals reviewed. Constitutional: He appears well-developed and well-nourished. No distress.  HENT:  Head: Normocephalic and atraumatic.  Eyes:  Conjunctivae normal are normal. No scleral icterus.  Mouth: Dry oral mucosa Neck: Normal range of motion. Neck supple.  Cardiovascular: Tachycardia, regular rhythm and normal heart sounds.   Pulmonary/Chest: Effort normal and breath sounds normal. No respiratory distress.  Abdominal: Soft.  Normal bowel sounds, diffuse tenderness  Musculoskeletal: He exhibits no edema.  Neurological: He is alert and oriented Skin: Skin is warm and dry. He is not diaphoretic.  Psychiatric: His behavior is normal.    ED Course  Procedures (including critical care time) Labs Review Labs Reviewed  CBC WITH DIFFERENTIAL  COMPREHENSIVE METABOLIC PANEL  LIPASE, BLOOD   Imaging Review No results found.   EKG Interpretation None      MDM   Final diagnoses:  Nausea and vomiting  Tenesmus    Patient here with nausea and vomiting that began last night.  No focal abdominal tenderness.  Vision is otherwise healthy young male without a significant medical history.  I suspect likely gastroenteritis dehydration.  Patient will receive fluids, Zofran, labs.  11:11 AM Patient with symptoms consistent with viral gastroenteritis.  Vitals are stable, no fever.  No signs of dehydration, tolerating PO fluids > 6 oz.  Lungs are clear.  No focal abdominal pain, no concern for appendicitis, cholecystitis, pancreatitis, ruptured viscus, UTI, kidney stone, or any other abdominal etiology.  Supportive therapy indicated with return if symptoms worsen.  Patient counseled.   Arthor CaptainAbigail Cristan Scherzer, PA-C 03/02/14 1958

## 2014-03-04 NOTE — ED Provider Notes (Signed)
Medical screening examination/treatment/procedure(s) were performed by non-physician practitioner and as supervising physician I was immediately available for consultation/collaboration.   EKG Interpretation None        Rolland PorterMark Chou Busler, MD 03/04/14 [phone]

## 2014-12-17 ENCOUNTER — Emergency Department (HOSPITAL_COMMUNITY)
Admission: EM | Admit: 2014-12-17 | Discharge: 2014-12-17 | Disposition: A | Discharge status: Home / Self Care | Payer: Self-pay | Attending: Emergency Medicine | Admitting: Emergency Medicine

## 2014-12-17 ENCOUNTER — Encounter (HOSPITAL_COMMUNITY): Payer: Self-pay | Admitting: Emergency Medicine

## 2014-12-17 DIAGNOSIS — R112 Nausea with vomiting, unspecified: Secondary | ICD-10-CM | POA: Insufficient documentation

## 2014-12-17 DIAGNOSIS — J45909 Unspecified asthma, uncomplicated: Secondary | ICD-10-CM | POA: Insufficient documentation

## 2014-12-17 DIAGNOSIS — Z79899 Other long term (current) drug therapy: Secondary | ICD-10-CM | POA: Insufficient documentation

## 2014-12-17 DIAGNOSIS — J029 Acute pharyngitis, unspecified: Secondary | ICD-10-CM

## 2014-12-17 DIAGNOSIS — R109 Unspecified abdominal pain: Secondary | ICD-10-CM | POA: Insufficient documentation

## 2014-12-17 DIAGNOSIS — Z720 Tobacco use: Secondary | ICD-10-CM | POA: Insufficient documentation

## 2014-12-17 LAB — RAPID STREP SCREEN (MED CTR MEBANE ONLY): Streptococcus, Group A Screen (Direct): NEGATIVE

## 2014-12-17 MED ORDER — DEXAMETHASONE SODIUM PHOSPHATE 10 MG/ML IJ SOLN
10.0000 mg | Freq: Once | INTRAMUSCULAR | Status: AC
  Administered 2014-12-17: 10 mg via INTRAMUSCULAR
  Filled 2014-12-17: qty 1

## 2014-12-17 MED ORDER — HYDROCODONE-ACETAMINOPHEN 7.5-325 MG/15ML PO SOLN
10.0000 mL | Freq: Once | ORAL | Status: AC
  Administered 2014-12-17: 10 mL via ORAL
  Filled 2014-12-17: qty 15

## 2014-12-17 MED ORDER — CEFTRIAXONE SODIUM 1 G IJ SOLR
1.0000 g | Freq: Once | INTRAMUSCULAR | Status: AC
  Administered 2014-12-17: 1 g via INTRAMUSCULAR
  Filled 2014-12-17: qty 10

## 2014-12-17 MED ORDER — PENICILLIN V POTASSIUM 500 MG PO TABS: 500.0000 mg | ORAL_TABLET | Freq: Three times a day (TID) | ORAL | Status: DC

## 2014-12-17 MED ORDER — ONDANSETRON HCL 4 MG PO TABS: 4.0000 mg | ORAL_TABLET | Freq: Four times a day (QID) | ORAL | Status: DC

## 2014-12-17 MED ORDER — ONDANSETRON 4 MG PO TBDP
4.0000 mg | ORAL_TABLET | Freq: Once | ORAL | Status: AC
  Administered 2014-12-17: 4 mg via ORAL
  Filled 2014-12-17: qty 1

## 2014-12-17 MED ORDER — PENICILLIN G BENZATHINE 1200000 UNIT/2ML IM SUSP: 1.2000 10*6.[IU] | Freq: Once | INTRAMUSCULAR | Status: DC

## 2014-12-17 MED ORDER — HYDROCODONE-ACETAMINOPHEN 7.5-325 MG/15ML PO SOLN: 15.0000 mL | ORAL | Status: DC | PRN

## 2014-12-17 MED ORDER — DEXAMETHASONE SODIUM PHOSPHATE 10 MG/ML IJ SOLN: 10.0000 mg | Freq: Once | INTRAMUSCULAR | Status: DC

## 2014-12-17 MED ORDER — LIDOCAINE HCL (PF) 1 % IJ SOLN
INTRAMUSCULAR | Status: AC
  Administered 2014-12-17: 2.1 mL
  Filled 2014-12-17: qty 5

## 2014-12-17 NOTE — ED Notes (Signed)
Pt from home c/o sore throat and nausea since Thursday.

## 2014-12-17 NOTE — Discharge Instructions (Signed)

## 2014-12-17 NOTE — ED Provider Notes (Signed)
CSN: [id number]     Arrival date & time 12/17/14  1629 History  This chart was scribed for non-physician practitioner working with Doug SouSam Jacubowitz, MD by Elveria Risingimelie Horne, ED Scribe. This patient was seen in room WTR6/WTR6 and the patient's care was started at 5:36 PM.   Chief Complaint  Patient presents with  . Sore Throat  . Nausea   The history is provided by the patient. No language interpreter was used.   HPI Comments: Shane Holmes is a 29 y.o. male who presents to the Emergency Department complaining of vomiting and after eating a meal at [employer], three night ago. Patient reports several episodes of vomiting that night; patient states he was unable to get any rest. Patient reports associated nausea, abdominal pain and sore throat. Since then the vomiting has resolved. Patient reports pain with eating and drinking and states that he hasn't been eating well l due to the pain but he has been drinking well. Patient that his "sinues have been acting up."   Past Medical History  Diagnosis Date  . Back pain   . Asthma    History reviewed. No pertinent past surgical history. No family history on file. History  Substance Use Topics  . Smoking status: Current Some Day Smoker    Types: Cigarettes  . Smokeless tobacco: Not on file  . Alcohol Use: 3.6 oz/week    6 Cans of beer per week    Review of Systems  Constitutional: Negative for fever and chills.  HENT: Positive for sore throat. Negative for trouble swallowing.   Respiratory: Positive for cough.   Gastrointestinal: Positive for nausea, vomiting and abdominal pain.   Allergies  Review of patient's allergies indicates no known allergies.  Home Medications   Prior to Admission medications   Medication Sig Start Date End Date Taking? Authorizing Provider  dicyclomine (BENTYL) 20 MG tablet Take 1 tablet (20 mg total) by mouth 2 (two) times daily. 03/02/14   Arthor CaptainAbigail Harris, PA-C  HYDROcodone-acetaminophen (HYCET) 7.5-325 mg/15 ml  solution Take 15 mLs by mouth every 4 (four) hours as needed for moderate pain or severe pain. 12/17/14   Geraldin Habermehl Irine SealG Nariyah Osias, PA-C  ondansetron (ZOFRAN) 4 MG tablet Take 1 tablet (4 mg total) by mouth every 6 (six) hours. 12/17/14   Casimiro Lienhard Irine SealG Rylan Bernard, PA-C  ondansetron (ZOFRAN) 4 MG tablet Take 1 tablet (4 mg total) by mouth every 6 (six) hours. 12/17/14   Quierra Silverio Irine SealG Anusha Claus, PA-C  penicillin v potassium (VEETID) 500 MG tablet Take 1 tablet (500 mg total) by mouth 3 (three) times daily. 12/17/14   Dorthula Matasiffany G Kearney Evitt, PA-C   Triage Vitals: BP 119/87 mmHg  Pulse 120  Temp(Src) 98.3 F (36.8 C) (Oral)  Resp 18  SpO2 99% Physical Exam  Constitutional: He is oriented to person, place, and time. He appears well-developed and well-nourished. No distress.  HENT:  Head: Normocephalic and atraumatic.  Right Ear: Tympanic membrane, external ear and ear canal normal.  Left Ear: Tympanic membrane, external ear and ear canal normal.  Nose: Nose normal. No rhinorrhea. Right sinus exhibits no maxillary sinus tenderness and no frontal sinus tenderness. Left sinus exhibits no maxillary sinus tenderness and no frontal sinus tenderness.  Mouth/Throat: Uvula is midline and mucous membranes are normal. No trismus in the jaw. Normal dentition. No dental abscesses or uvula swelling. Oropharyngeal exudate and posterior oropharyngeal edema present. No posterior oropharyngeal erythema or tonsillar abscesses.  No submental edema, tongue not elevated, no trismus. No impending airway obstruction;  Pt able to speak full sentences, swallow intact, no drooling, stridor, or tonsillar/uvula displacement. No palatal petechia  Eyes: Conjunctivae and EOM are normal.  Neck: Trachea normal, normal range of motion and full passive range of motion without pain. Neck supple. No rigidity. No tracheal deviation and normal range of motion present. No Brudzinski's sign noted.  Flexion and extension of neck without pain or difficulty. Able to breath  without difficulty in extension.  Cardiovascular: Normal rate and regular rhythm.   Pulmonary/Chest: Effort normal and breath sounds normal. No stridor. No respiratory distress. He has no wheezes.  Abdominal: Soft. There is no tenderness.  No obvious evidence of splenomegaly. Non ttp.   Musculoskeletal: Normal range of motion.  Lymphadenopathy:       Head (right side): No preauricular and no posterior auricular adenopathy present.       Head (left side): No preauricular and no posterior auricular adenopathy present.    He has cervical adenopathy.  Neurological: He is alert and oriented to person, place, and time.  Skin: Skin is warm and dry. No rash noted. He is not diaphoretic.  Psychiatric: He has a normal mood and affect. His behavior is normal.  Nursing note and vitals reviewed.   ED Course  Procedures (including critical care time)  COORDINATION OF CARE: 5:41 PM- Discussed treatment plan with patient at bedside and patient agreed to plan.   Labs Review Labs Reviewed  RAPID STREP SCREEN  CULTURE, GROUP A STREP    Imaging Review No results found.   EKG Interpretation None      MDM   Final diagnoses:  Pharyngitis    Patient describes difficulty swallowing pills. His throat is very erythematous. He has been given IM rocephin and IM Decadron in the ED. Will be discharged with penicillin and hycet for pain control. Advised to continue to drink plenty of fluids. On arrival his pulse is 120, at discharge it is improved upper 90's.  28 y.o.Shane Holmes's evaluation in the Emergency Department is complete. It has been determined that no acute conditions requiring further emergency intervention are present at this time. The patient/guardian have been advised of the diagnosis and plan. We have discussed signs and symptoms that warrant return to the ED, such as changes or worsening in symptoms.  Vital signs are stable at discharge. Filed Vitals:   12/17/14 1807  BP:    Pulse: 89  Temp:   Resp:     Patient/guardian has voiced understanding and agreed to follow-up with the PCP or specialist.   I personally performed the services described in this documentation, which was scribed in my presence. The recorded information has been reviewed and is accurate.    Dorthula Matas, PA-C 12/17/14 1811  Doug Sou, MD 12/18/14 [phone]

## 2014-12-17 NOTE — ED Notes (Signed)
Patient c/o sore throat and nausea x3 days.  Patient states he has not measured his temperature, but denies fever and vomiting.

## 2014-12-19 LAB — CULTURE, GROUP A STREP

## 2015-05-09 ENCOUNTER — Encounter (HOSPITAL_COMMUNITY): Payer: Self-pay

## 2015-05-09 ENCOUNTER — Emergency Department (HOSPITAL_COMMUNITY)
Admission: EM | Admit: 2015-05-09 | Discharge: 2015-05-09 | Discharge status: Against Medical Advice | Payer: Self-pay | Attending: Emergency Medicine | Admitting: Emergency Medicine

## 2015-05-09 DIAGNOSIS — R109 Unspecified abdominal pain: Secondary | ICD-10-CM | POA: Insufficient documentation

## 2015-05-09 DIAGNOSIS — J45909 Unspecified asthma, uncomplicated: Secondary | ICD-10-CM | POA: Insufficient documentation

## 2015-05-09 DIAGNOSIS — Z720 Tobacco use: Secondary | ICD-10-CM | POA: Insufficient documentation

## 2015-05-09 DIAGNOSIS — R1110 Vomiting, unspecified: Secondary | ICD-10-CM | POA: Insufficient documentation

## 2015-05-09 LAB — CBC WITH DIFFERENTIAL/PLATELET
Basophils Absolute: 0 10*3/uL (ref 0.0–0.1)
Basophils Relative: 0 % (ref 0–1)
Eosinophils Absolute: 0 10*3/uL (ref 0.0–0.7)
Eosinophils Relative: 0 % (ref 0–5)
HCT: 48.2 % (ref 39.0–52.0)
Hemoglobin: 16.8 g/dL (ref 13.0–17.0)
Lymphocytes Relative: 15 % (ref 12–46)
Lymphs Abs: 0.9 10*3/uL (ref 0.7–4.0)
MCH: 30.1 pg (ref 26.0–34.0)
MCHC: 34.9 g/dL (ref 30.0–36.0)
MCV: 86.4 fL (ref 78.0–100.0)
MONOS PCT: 9 % (ref 3–12)
Monocytes Absolute: 0.5 10*3/uL (ref 0.1–1.0)
NEUTROS PCT: 76 % (ref 43–77)
Neutro Abs: 4.4 10*3/uL (ref 1.7–7.7)
Platelets: 207 10*3/uL (ref 150–400)
RBC: 5.58 MIL/uL (ref 4.22–5.81)
RDW: 13.8 % (ref 11.5–15.5)
WBC: 5.9 10*3/uL (ref 4.0–10.5)

## 2015-05-09 LAB — COMPREHENSIVE METABOLIC PANEL
ALT: 23 U/L (ref 17–63)
ANION GAP: 11 (ref 5–15)
AST: 31 U/L (ref 15–41)
Albumin: 4.4 g/dL (ref 3.5–5.0)
Alkaline Phosphatase: 77 U/L (ref 38–126)
BUN: 6 mg/dL (ref 6–20)
CO2: 24 mmol/L (ref 22–32)
Calcium: 9.4 mg/dL (ref 8.9–10.3)
Chloride: 101 mmol/L (ref 101–111)
Creatinine, Ser: 0.84 mg/dL (ref 0.61–1.24)
GFR calc non Af Amer: >60 mL/min (ref >60)
Glucose, Bld: 94 mg/dL (ref 65–99)
POTASSIUM: 4.2 mmol/L (ref 3.5–5.1)
Sodium: 136 mmol/L (ref 135–145)
TOTAL PROTEIN: 7.6 g/dL (ref 6.5–8.1)
Total Bilirubin: 1.4 mg/dL — ABNORMAL HIGH (ref 0.3–1.2)

## 2015-05-09 LAB — LIPASE, BLOOD: Lipase: 27 U/L (ref 22–51)

## 2015-05-09 NOTE — ED Notes (Signed)
Pt states he has abd pain and vomiting since Sunday. No diarrhea. Thinks he has food poisoning.

## 2015-05-09 NOTE — ED Notes (Signed)
Pt called  X 3 with no answer

## 2015-05-09 NOTE — ED Notes (Signed)
Called to move pt to room with no answer x2

## 2015-07-02 ENCOUNTER — Emergency Department (HOSPITAL_COMMUNITY): Payer: Self-pay

## 2015-07-02 ENCOUNTER — Encounter (HOSPITAL_COMMUNITY): Payer: Self-pay | Admitting: Emergency Medicine

## 2015-07-02 ENCOUNTER — Emergency Department (HOSPITAL_COMMUNITY)
Admission: EM | Admit: 2015-07-02 | Discharge: 2015-07-02 | Disposition: A | Discharge status: Home / Self Care | Payer: Self-pay | Attending: Emergency Medicine | Admitting: Emergency Medicine

## 2015-07-02 DIAGNOSIS — R0789 Other chest pain: Secondary | ICD-10-CM | POA: Insufficient documentation

## 2015-07-02 DIAGNOSIS — Z79899 Other long term (current) drug therapy: Secondary | ICD-10-CM | POA: Insufficient documentation

## 2015-07-02 DIAGNOSIS — Z720 Tobacco use: Secondary | ICD-10-CM | POA: Insufficient documentation

## 2015-07-02 DIAGNOSIS — J45909 Unspecified asthma, uncomplicated: Secondary | ICD-10-CM | POA: Insufficient documentation

## 2015-07-02 MED ORDER — NAPROXEN 250 MG PO TABS: 250.0000 mg | ORAL_TABLET | Freq: Two times a day (BID) | ORAL | Status: DC

## 2015-07-02 MED ORDER — METHOCARBAMOL 500 MG PO TABS: 500.0000 mg | ORAL_TABLET | Freq: Two times a day (BID) | ORAL | Status: DC | PRN

## 2015-07-02 MED ORDER — IBUPROFEN 400 MG PO TABS
800.0000 mg | ORAL_TABLET | Freq: Once | ORAL | Status: AC
  Administered 2015-07-02: 800 mg via ORAL
  Filled 2015-07-02: qty 2

## 2015-07-02 NOTE — ED Notes (Signed)
Pt c/o left rib pain onset Friday evening. Pt thinks it could because he was doing a lot of swimming and diving off the diving board all day Friday. Pt reports that he worked out on Saturday increasing pain.

## 2015-07-02 NOTE — Discharge Instructions (Signed)
Chest Wall Pain  Chest wall pain is pain in or around the bones and muscles of your chest. It may take up to 6 weeks to get better. It may take longer if you must stay physically active in your work and activities.   CAUSES   Chest wall pain may happen on its own. However, it may be caused by:  · A viral illness like the flu.  · Injury.  · Coughing.  · Exercise.  · Arthritis.  · Fibromyalgia.  · Shingles.  HOME CARE INSTRUCTIONS   · Avoid overtiring physical activity. Try not to strain or perform activities that cause pain. This includes any activities using your chest or your abdominal and side muscles, especially if heavy weights are used.  · Put ice on the sore area.  · Put ice in a plastic bag.  · Place a towel between your skin and the bag.  · Leave the ice on for 15-20 minutes per hour while awake for the first 2 days.  · Only take over-the-counter or prescription medicines for pain, discomfort, or fever as directed by your caregiver.  SEEK IMMEDIATE MEDICAL CARE IF:   · Your pain increases, or you are very uncomfortable.  · You have a fever.  · Your chest pain becomes worse.  · You have new, unexplained symptoms.  · You have nausea or vomiting.  · You feel sweaty or lightheaded.  · You have a cough with phlegm (sputum), or you cough up blood.  MAKE SURE YOU:   · Understand these instructions.  · Will watch your condition.  · Will get help right away if you are not doing well or get worse.  Document Released: 11/18/2005 Document Revised: 02/10/2012 Document Reviewed: 07/15/2011  ExitCare® Patient Information ©2015 [employer]. This information is not intended to replace advice given to you by your health care provider. Make sure you discuss any questions you have with your health care provider.  Rib Contusion  A rib contusion (bruise) can occur by a blow to the chest or by a fall against a hard object. Usually these will be much better in a couple weeks. If X-rays were taken today and there are no broken  bones (fractures), the diagnosis of bruising is made. However, broken ribs may not show up for several days, or may be discovered later on a routine X-ray when signs of healing show up. If this happens to you, it does not mean that something was missed on the X-ray, but simply that it did not show up on the first X-rays. Earlier diagnosis will not usually change the treatment.  HOME CARE INSTRUCTIONS   · Avoid strenuous activity. Be careful during activities and avoid bumping the injured ribs. Activities that pull on the injured ribs and cause pain should be avoided, if possible.  · For the first day or two, an ice pack used every 20 minutes while awake may be helpful. Put ice in a plastic bag and put a towel between the bag and the skin.  · Eat a normal, well-balanced diet. Drink plenty of fluids to avoid constipation.  · Take deep breaths several times a day to keep lungs free of infection. Try to cough several times a day. Splint the injured area with a pillow while coughing to ease pain. Coughing can help prevent pneumonia.  · Wear a rib belt or binder only if told to do so by your caregiver. If you are wearing a rib belt or binder, you   must do the breathing exercises as directed by your caregiver. If not used properly, rib belts or binders restrict breathing which can lead to pneumonia.  · Only take over-the-counter or prescription medicines for pain, discomfort, or fever as directed by your caregiver.  SEEK MEDICAL CARE IF:   · You or your child has an oral temperature above 102° F (38.9° C).  · Your baby is older than 3 months with a rectal temperature of 100.5° F (38.1° C) or higher for more than 1 day.  · You develop a cough, with thick or bloody sputum.  SEEK IMMEDIATE MEDICAL CARE IF:   · You have difficulty breathing.  · You feel sick to your stomach (nausea), have vomiting or belly (abdominal) pain.  · You have worsening pain, not controlled with medications, or there is a change in the location of the  pain.  · You develop sweating or radiation of the pain into the arms, jaw or shoulders, or become light headed or faint.  · You or your child has an oral temperature above 102° F (38.9° C), not controlled by medicine.  · Your or your baby is older than 3 months with a rectal temperature of 102° F (38.9° C) or higher.  · Your baby is 3 months old or younger with a rectal temperature of 100.4° F (38° C) or higher.  MAKE SURE YOU:   · Understand these instructions.  · Will watch your condition.  · Will get help right away if you are not doing well or get worse.  Document Released: 08/13/2001 Document Revised: 03/15/2013 Document Reviewed: 07/06/2008  ExitCare® Patient Information ©2015 [employer]. This information is not intended to replace advice given to you by your health care provider. Make sure you discuss any questions you have with your health care provider.

## 2015-07-02 NOTE — ED Notes (Signed)
Declined W/C at D/C and was escorted to lobby by RN. 

## 2015-07-02 NOTE — ED Provider Notes (Signed)
CSN: [id number]     Arrival date & time 07/02/15  1615 History  This chart was scribed for Everlene Farrier, PA-C, working with Blake Divine, MD by Chestine Spore, ED Scribe. The patient was seen in room TR10C/TR10C at 5:20 PM.     Chief Complaint  Patient presents with  . Rib pain       The history is provided by the patient. No language interpreter was used.    HPI Comments: Shane Holmes is a 29 y.o. male with a medical hx of back pain and asthma who presents to the Emergency Department complaining of left rib pain onset 2 days ago. Pt reports that he was swimming and diving off a diving board all day Friday. Pt also notes that he went to work and he does heavy lifting on Saturday and had increasing pain. Pt reports that his pain is worsened with deep breathing. He reports that his left rib pain is worsened with movement. He states that he has tried his friends pain medication this morning with no relief for his symptoms. Pt denies CP, cough, palpitations, rash, fever, chills, SOB, wheezing, abdominal pain, back pain, n/v, numbness, tingling, weakness, and any other symptoms. Pt does not have a PCP.    Past Medical History  Diagnosis Date  . Back pain   . Asthma    History reviewed. No pertinent past surgical history. No family history on file. History  Substance Use Topics  . Smoking status: Current Some Day Smoker    Types: Cigarettes  . Smokeless tobacco: Not on file  . Alcohol Use: 3.6 oz/week    6 Cans of beer per week    Review of Systems  Constitutional: Negative for fever and chills.  Respiratory: Negative for cough, shortness of breath and wheezing.   Cardiovascular: Negative for chest pain and palpitations.  Gastrointestinal: Negative for nausea, vomiting and abdominal pain.  Musculoskeletal: Positive for arthralgias. Negative for back pain and neck pain.  Skin: Negative for color change and rash.  Neurological: Negative for weakness, light-headedness and numbness.        No tingling      Allergies  Review of patient's allergies indicates no known allergies.  Home Medications   Prior to Admission medications   Medication Sig Start Date End Date Taking? Authorizing Provider  dicyclomine (BENTYL) 20 MG tablet Take 1 tablet (20 mg total) by mouth 2 (two) times daily. 03/02/14   [name], PA-C  HYDROcodone-acetaminophen (HYCET) 7.5-325 mg/15 ml solution Take 15 mLs by mouth every 4 (four) hours as needed for moderate pain or severe pain. 12/17/14   Tiffany Neva Seat, PA-C  ondansetron (ZOFRAN) 4 MG tablet Take 1 tablet (4 mg total) by mouth every 6 (six) hours. 12/17/14   Tiffany Neva Seat, PA-C  ondansetron (ZOFRAN) 4 MG tablet Take 1 tablet (4 mg total) by mouth every 6 (six) hours. 12/17/14   [name], PA-C  penicillin v potassium (VEETID) 500 MG tablet Take 1 tablet (500 mg total) by mouth 3 (three) times daily. 12/17/14   Tiffany Neva Seat, PA-C   BP 120/85 mmHg  Pulse 96  Temp(Src) 98.2 F (36.8 C) (Oral)  Resp 16  Ht 5\' 11"  (1.803 m)  Wt 150 lb (68.04 kg)  BMI 20.93 kg/m2  SpO2 97% Physical Exam  Constitutional: He appears well-developed and well-nourished. No distress.  HENT:  Head: Normocephalic and atraumatic.  Mouth/Throat: Oropharynx is clear and moist.  Eyes: Conjunctivae are normal. Pupils are equal, round, and reactive to  light. Right eye exhibits no discharge. Left eye exhibits no discharge.  Neck: Neck supple.  Cardiovascular: Normal rate, regular rhythm, normal heart sounds and intact distal pulses.   Pulmonary/Chest: Effort normal and breath sounds normal. No respiratory distress. He has no wheezes. He has no rales. He exhibits tenderness.  Lungs are clear to auscultation bilaterally. Patient has left-sided chest wall tenderness to palpation which reproduces his pain. No flail chest.  Abdominal: Soft. There is no tenderness.  Musculoskeletal:  Reproducible left rib pain. No flayed chest. Symmetrical chest rise and fall. No  ecchymosis or edema. No rash noted. No bruises or bleeding noted. No midline back or neck tenderness.  Lymphadenopathy:    He has no cervical adenopathy.  Neurological: He is alert. Coordination normal.  Skin: Skin is warm and dry. No rash noted. He is not diaphoretic.  Psychiatric: He has a normal mood and affect. His behavior is normal.  Nursing note and vitals reviewed.   ED Course  Procedures (including critical care time) DIAGNOSTIC STUDIES: Oxygen Saturation is 97% on RA, nl by my interpretation.    COORDINATION OF CARE: 5:25 PM-Discussed treatment plan which includes CXR, X-ray of left unilateral ribs with chest, naprosyn Rx, muscle relaxer Rx, referral to [facility], with pt at bedside and pt agreed to plan.   Labs Review Labs Reviewed - No data to display  Imaging Review Dg Ribs Unilateral W/chest Left  07/02/2015   CLINICAL DATA:  Left anterior rib pain  EXAM: LEFT RIBS AND CHEST - 3+ VIEW  COMPARISON:  Chest/right rib radiographs dated 12/20/2012  FINDINGS: Lungs are clear.  No pleural effusion or pneumothorax.  The heart is normal in size.  No displaced left rib fracture is seen.  IMPRESSION: No evidence of acute cardiopulmonary disease.  No displaced left rib fracture is seen.   Electronically Signed   By: Charline Bills M.D.   On: 07/02/2015 17:05     EKG Interpretation None      Filed Vitals:   07/02/15 1624  BP: 120/85  Pulse: 96  Temp: 98.2 F (36.8 C)  TempSrc: Oral  Resp: 16  Height:  (1.803 m)  Weight: 150 lb (68.04 kg)  SpO2: 97%     MDM   Meds given in ED:  Medications  ibuprofen (ADVIL,MOTRIN) tablet 800 mg (800 mg Oral Given 07/02/15 1736)    Discharge Medication List as of 07/02/2015  5:28 PM    START taking these medications   Details  methocarbamol (ROBAXIN) 500 MG tablet Take 1 tablet (500 mg total) by mouth 2 (two) times daily as needed for muscle spasms., Starting 07/02/2015, Until Discontinued, Print     naproxen (NAPROSYN) 250 MG tablet Take 1 tablet (250 mg total) by mouth 2 (two) times daily with a meal., Starting 07/02/2015, Until Discontinued, Print        Final diagnoses:  Left-sided chest wall pain   This is a 29 year old male who presents to the emergency department complaining of left rib pain for the past 3 days. Patient reports he has been swimming and jumping off the diving board and believes he may have injured his ribs then. He also reports doing lots of heavy lifting work and reports this aggravates his pain. He reports pain only with movement and no pain without movement. The patient denies chest pain or shortness of breath. The patient denies fevers or chills. On exam the patient is afebrile and nontoxic appearing. [name] left lateral chest wall  is tender to palpation and reproduces his pain. No flail chest. Equal chest rise and fall. His lungs are clear to auscultation bilaterally. No chest edema, ecchymosis or erythema. Left rib x-ray with chest is unremarkable. No rib fracture noted. We'll discharge patient with prescriptions for naproxen and Robaxin and have him follow-up with his primary care provider for continued pain. I advised the patient to follow-up with their primary care provider this week. I advised the patient to return to the emergency department with new or worsening symptoms or new concerns. The patient verbalized understanding and agreement with plan.    I personally performed the services described in this documentation, which was scribed in my presence. The recorded information has been reviewed and is accurate.     Everlene Farrier, PA-C 07/02/15 2012  Blake Divine, MD 07/04/15 [phone]

## 2016-03-04 ENCOUNTER — Emergency Department (HOSPITAL_COMMUNITY)
Admission: EM | Admit: 2016-03-04 | Discharge: 2016-03-04 | Disposition: A | Discharge status: Home / Self Care | Payer: Self-pay

## 2016-03-04 NOTE — ED Notes (Signed)
Called for with no answer

## 2016-03-04 NOTE — ED Notes (Signed)
Called in lobby with no answer 

## 2016-03-04 NOTE — ED Notes (Signed)
Patient called in main ED waiting area with no response 

## 2017-06-21 ENCOUNTER — Emergency Department (HOSPITAL_COMMUNITY)
Admission: EM | Admit: 2017-06-21 | Discharge: 2017-06-21 | Disposition: A | Discharge status: Home / Self Care | Payer: No Typology Code available for payment source | Attending: Emergency Medicine | Admitting: Emergency Medicine

## 2017-06-21 ENCOUNTER — Emergency Department (HOSPITAL_COMMUNITY): Payer: No Typology Code available for payment source

## 2017-06-21 ENCOUNTER — Encounter (HOSPITAL_COMMUNITY): Payer: Self-pay | Admitting: Emergency Medicine

## 2017-06-21 DIAGNOSIS — Y9389 Activity, other specified: Secondary | ICD-10-CM | POA: Diagnosis not present

## 2017-06-21 DIAGNOSIS — Y929 Unspecified place or not applicable: Secondary | ICD-10-CM | POA: Diagnosis not present

## 2017-06-21 DIAGNOSIS — F17210 Nicotine dependence, cigarettes, uncomplicated: Secondary | ICD-10-CM | POA: Diagnosis not present

## 2017-06-21 DIAGNOSIS — S8011XA Contusion of right lower leg, initial encounter: Secondary | ICD-10-CM | POA: Diagnosis not present

## 2017-06-21 DIAGNOSIS — J45909 Unspecified asthma, uncomplicated: Secondary | ICD-10-CM | POA: Diagnosis not present

## 2017-06-21 DIAGNOSIS — Y998 Other external cause status: Secondary | ICD-10-CM | POA: Insufficient documentation

## 2017-06-21 DIAGNOSIS — W19XXXA Unspecified fall, initial encounter: Secondary | ICD-10-CM

## 2017-06-21 DIAGNOSIS — S8991XA Unspecified injury of right lower leg, initial encounter: Secondary | ICD-10-CM | POA: Diagnosis present

## 2017-06-21 MED ORDER — HYDROCODONE-ACETAMINOPHEN 5-325 MG PO TABS: 2.0000 | ORAL_TABLET | ORAL | 0 refills | Status: DC | PRN

## 2017-06-21 MED ORDER — OXYCODONE-ACETAMINOPHEN 5-325 MG PO TABS
1.0000 | ORAL_TABLET | ORAL | Status: DC | PRN
Start: 2017-06-21 — End: 2017-06-22
  Administered 2017-06-21: 1 via ORAL

## 2017-06-21 MED ORDER — OXYCODONE-ACETAMINOPHEN 5-325 MG PO TABS
ORAL_TABLET | ORAL | Status: AC
  Filled 2017-06-21: qty 1

## 2017-06-21 MED ORDER — IBUPROFEN 400 MG PO TABS: 600.0000 mg | ORAL_TABLET | Freq: Once | ORAL | Status: DC

## 2017-06-21 MED ORDER — HYDROCODONE-ACETAMINOPHEN 5-325 MG PO TABS
2.0000 | ORAL_TABLET | Freq: Once | ORAL | Status: AC
  Administered 2017-06-21: 2 via ORAL
  Filled 2017-06-21: qty 2

## 2017-06-21 NOTE — Discharge Instructions (Signed)
Take ibuprofen as needed every 6 hrs.  For severe pain take norco or vicodin however realize they have the potential for addiction and it can make you sleepy and has tylenol in it.  No operating machinery while taking.  Follow-up on Monday with orthopedics for reassessment.
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
## 2017-08-20 ENCOUNTER — Emergency Department (HOSPITAL_COMMUNITY): Payer: Self-pay

## 2017-08-20 ENCOUNTER — Emergency Department (HOSPITAL_COMMUNITY)
Admission: EM | Admit: 2017-08-20 | Discharge: 2017-08-20 | Discharge status: Against Medical Advice | Payer: Self-pay | Attending: Emergency Medicine | Admitting: Emergency Medicine

## 2017-08-20 ENCOUNTER — Encounter (HOSPITAL_COMMUNITY): Payer: Self-pay

## 2017-08-20 DIAGNOSIS — Z5321 Procedure and treatment not carried out due to patient leaving prior to being seen by health care provider: Secondary | ICD-10-CM | POA: Insufficient documentation

## 2017-08-20 DIAGNOSIS — R0789 Other chest pain: Secondary | ICD-10-CM | POA: Insufficient documentation

## 2017-08-20 MED ORDER — OXYCODONE-ACETAMINOPHEN 5-325 MG PO TABS
ORAL_TABLET | ORAL | Status: AC
  Filled 2017-08-20: qty 1

## 2017-08-20 MED ORDER — OXYCODONE-ACETAMINOPHEN 5-325 MG PO TABS
1.0000 | ORAL_TABLET | ORAL | Status: DC | PRN
  Administered 2017-08-20: 1 via ORAL

## 2017-08-20 NOTE — ED Notes (Signed)
Pt complaining of severe pain in waiting area, requesting pain medication
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
## 2017-08-20 NOTE — ED Notes (Signed)
Pt stated his ride needed to leave and he didn't have a way home so he was going to leave as well and follow up with a doctor in the morning. Pt and family left.

## 2018-01-19 ENCOUNTER — Encounter (HOSPITAL_COMMUNITY): Payer: Self-pay

## 2018-01-19 ENCOUNTER — Emergency Department (HOSPITAL_COMMUNITY): Payer: Self-pay

## 2018-01-19 DIAGNOSIS — R109 Unspecified abdominal pain: Secondary | ICD-10-CM | POA: Insufficient documentation

## 2018-01-19 DIAGNOSIS — Z5321 Procedure and treatment not carried out due to patient leaving prior to being seen by health care provider: Secondary | ICD-10-CM | POA: Insufficient documentation

## 2018-01-19 NOTE — ED Triage Notes (Signed)
Pt complains of left groin pain after he felt a strain this afternoon Pt states that it hurts in his left groin to his left testicle ans radiates to his left knee

## 2018-01-19 NOTE — ED Notes (Signed)
Pt works in [employer]eron[employer]ical [employer] and slipped and hurt his groin.

## 2018-01-20 ENCOUNTER — Emergency Department (HOSPITAL_COMMUNITY)
Admission: EM | Admit: 2018-01-20 | Discharge: 2018-01-20 | Disposition: A | Discharge status: Home / Self Care | Payer: Self-pay | Attending: Emergency Medicine | Admitting: Emergency Medicine

## 2018-01-20 DIAGNOSIS — R1030 Lower abdominal pain, unspecified: Secondary | ICD-10-CM

## 2018-01-20 NOTE — ED Notes (Signed)
Pt called from triage with no answer 

## 2018-09-18 IMAGING — DX DG FOOT COMPLETE 3+V*R*
3 series · 3 of 3 positions shown · non-contrast
Comparison: None.

CLINICAL DATA: Injury, pain

EXAM:
RIGHT FOOT COMPLETE - 3+ VIEW

[foot ap]
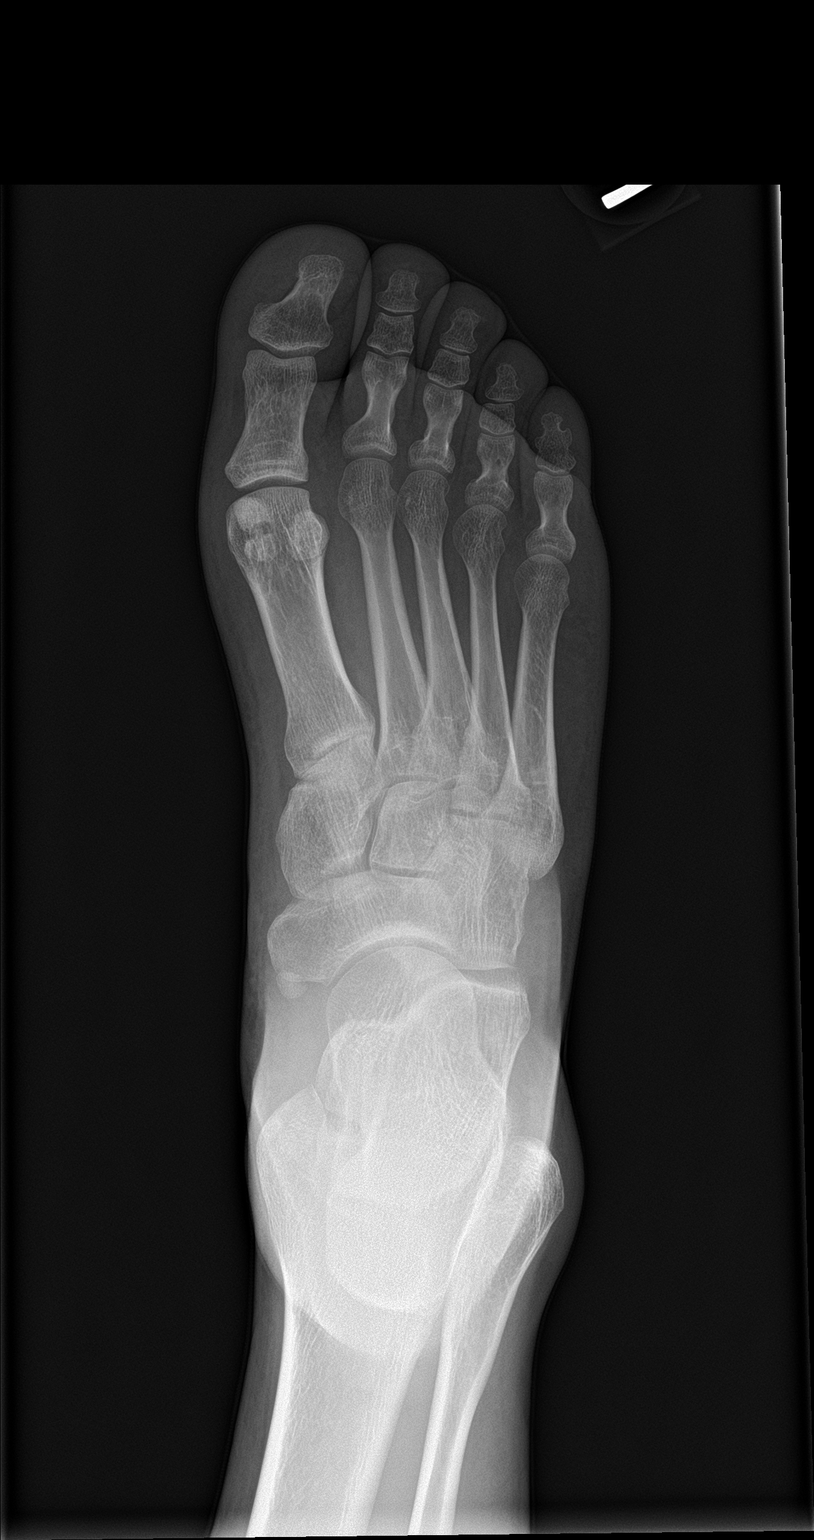

[foot obl]
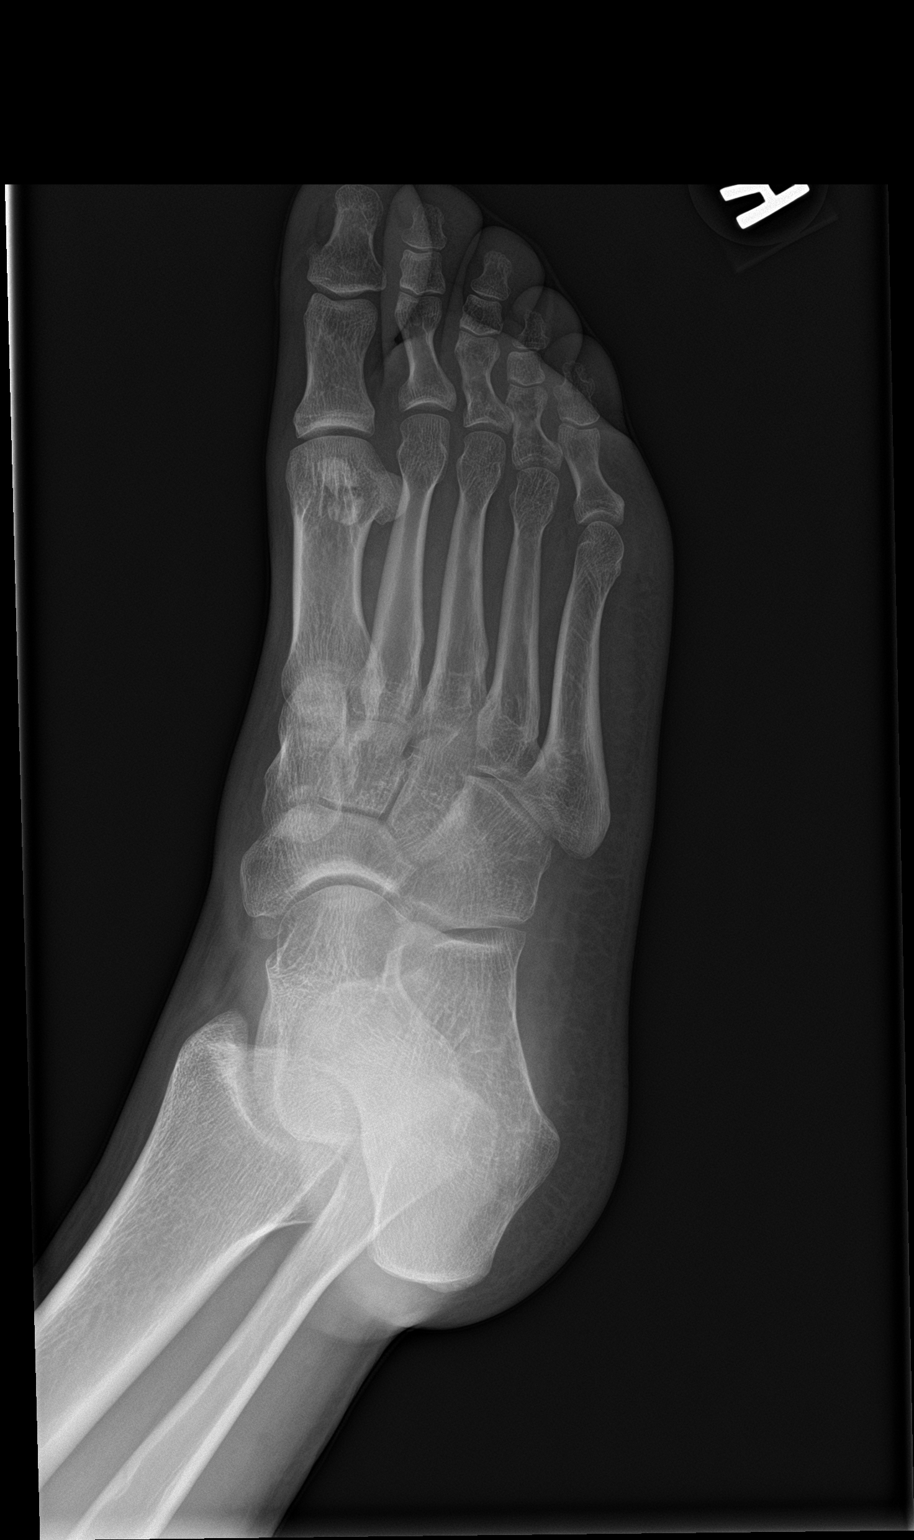

[foot lat]
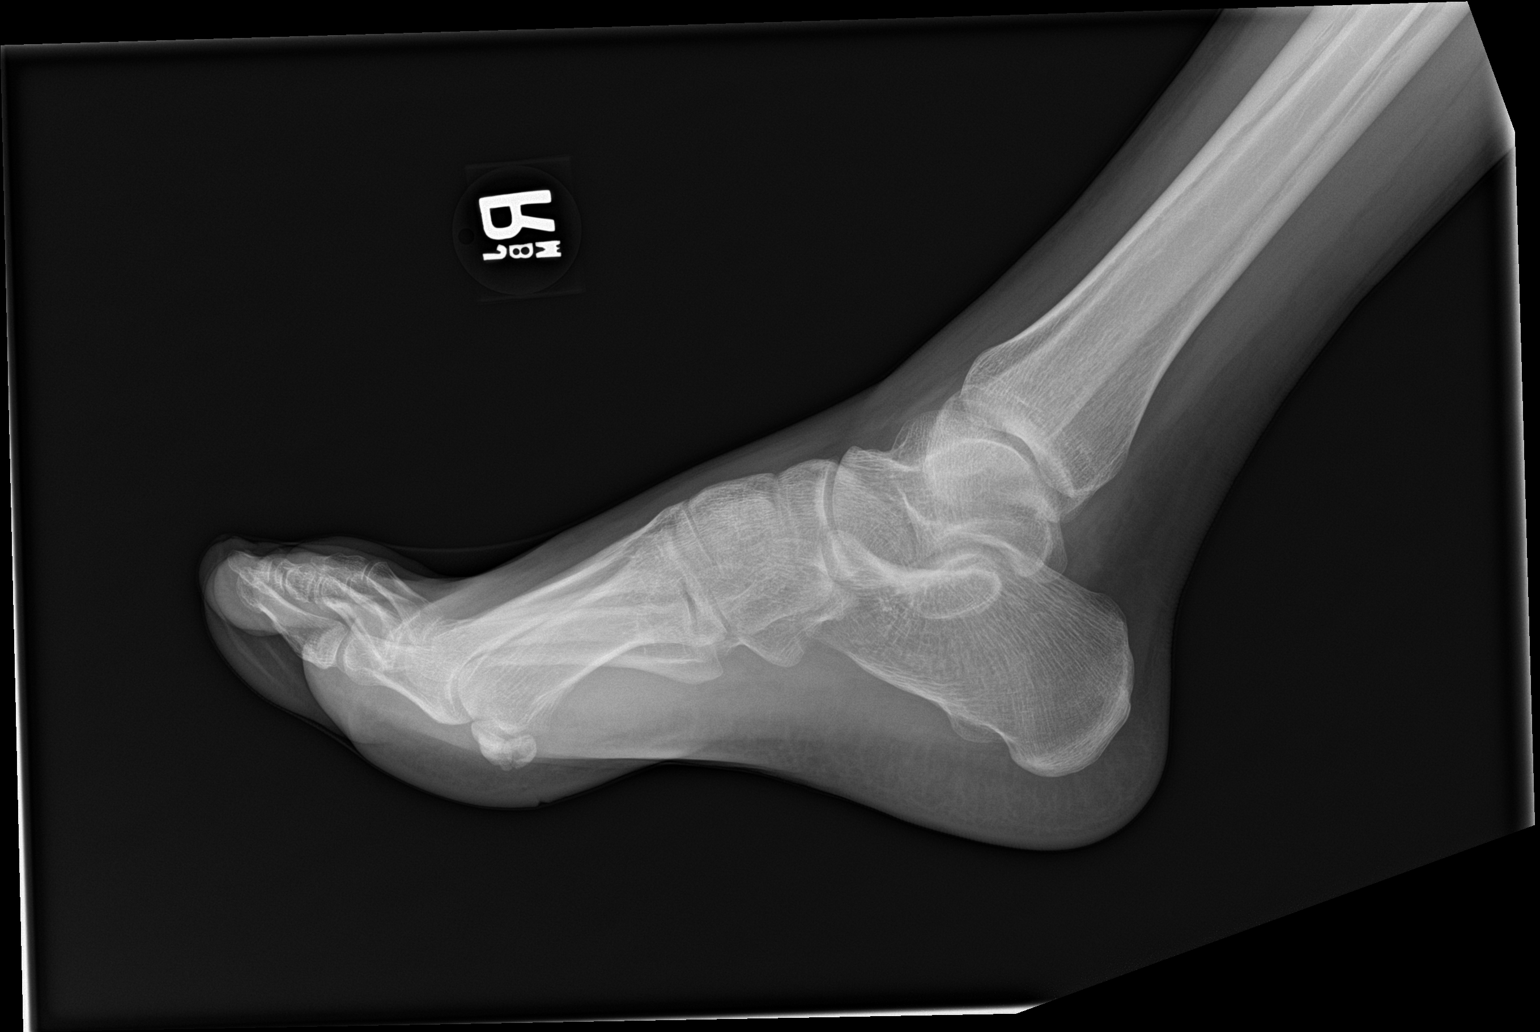

[3 of 3 positions shown; findings below may reference images not displayed]

FINDINGS: There is no evidence of fracture or dislocation. There is no
evidence of arthropathy or other focal bone abnormality. Soft
tissues are unremarkable.
IMPRESSION: Negative.

## 2018-10-19 ENCOUNTER — Emergency Department (HOSPITAL_COMMUNITY)
Admission: EM | Admit: 2018-10-19 | Discharge: 2018-10-19 | Disposition: A | Discharge status: Home / Self Care | Payer: Self-pay | Attending: Emergency Medicine | Admitting: Emergency Medicine

## 2018-10-19 ENCOUNTER — Other Ambulatory Visit: Payer: Self-pay

## 2018-10-19 ENCOUNTER — Encounter (HOSPITAL_COMMUNITY): Payer: Self-pay | Admitting: Emergency Medicine

## 2018-10-19 ENCOUNTER — Emergency Department (HOSPITAL_COMMUNITY): Payer: Self-pay

## 2018-10-19 DIAGNOSIS — R69 Illness, unspecified: Secondary | ICD-10-CM

## 2018-10-19 DIAGNOSIS — R030 Elevated blood-pressure reading, without diagnosis of hypertension: Secondary | ICD-10-CM | POA: Insufficient documentation

## 2018-10-19 DIAGNOSIS — F17210 Nicotine dependence, cigarettes, uncomplicated: Secondary | ICD-10-CM | POA: Insufficient documentation

## 2018-10-19 DIAGNOSIS — J45909 Unspecified asthma, uncomplicated: Secondary | ICD-10-CM | POA: Insufficient documentation

## 2018-10-19 DIAGNOSIS — J111 Influenza due to unidentified influenza virus with other respiratory manifestations: Secondary | ICD-10-CM

## 2018-10-19 DIAGNOSIS — J101 Influenza due to other identified influenza virus with other respiratory manifestations: Secondary | ICD-10-CM | POA: Insufficient documentation

## 2018-10-19 DIAGNOSIS — Z79899 Other long term (current) drug therapy: Secondary | ICD-10-CM | POA: Insufficient documentation

## 2018-10-19 MED ORDER — BENZONATATE 200 MG PO CAPS: 200.0000 mg | ORAL_CAPSULE | Freq: Three times a day (TID) | ORAL | 0 refills | Status: DC | PRN

## 2018-10-19 MED ORDER — AZITHROMYCIN 250 MG PO TABS: ORAL_TABLET | ORAL | 0 refills | Status: DC

## 2018-10-19 MED ORDER — HYDROCOD POLST-CPM POLST ER 10-8 MG/5ML PO SUER
5.0000 mL | Freq: Once | ORAL | Status: AC
  Administered 2018-10-19: 5 mL via ORAL
  Filled 2018-10-19: qty 5

## 2018-10-19 MED ORDER — IPRATROPIUM-ALBUTEROL 0.5-2.5 (3) MG/3ML IN SOLN
3.0000 mL | Freq: Once | RESPIRATORY_TRACT | Status: AC
  Administered 2018-10-19: 3 mL via RESPIRATORY_TRACT
  Filled 2018-10-19: qty 3

## 2018-10-19 NOTE — ED Provider Notes (Signed)
[facility]   CSN: [id number] Arrival date & time: 10/19/18  0901     History   Chief Complaint Chief Complaint  Patient presents with  . Cough    HPI Raeford RazorMilton L Sylve is a 32 y.o. male with hx of asthma who presents to the ED with cough congestion, fever, chills that started 3 days ago and has gotten worse. He reports using his inhaler and taking Aleve without improvement. Patient reports sinus pain and pressure and chest tightness with cough.  HPI  Past Medical History:  Diagnosis Date  . Asthma   . Back pain     There are no active problems to display for this patient.   History reviewed. No pertinent surgical history.      Home Medications    Prior to Admission medications   Medication Sig Start Date End Date Taking? Authorizing Provider  azithromycin (ZITHROMAX Z-PAK) 250 MG tablet Take the first 2 tablets together and then one tablet PO daily 10/19/18   Janne NapoleonNeese, Hope M, NP  benzonatate (TESSALON) 200 MG capsule Take 1 capsule (200 mg total) by mouth 3 (three) times daily as needed for cough. 10/19/18   Janne NapoleonNeese, Hope M, NP  dicyclomine (BENTYL) 20 MG tablet Take 1 tablet (20 mg total) by mouth 2 (two) times daily. 03/02/14   Arthor CaptainHarris, Abigail, PA-C  HYDROcodone-acetaminophen (NORCO) 5-325 MG tablet Take 2 tablets by mouth every 4 (four) hours as needed. 06/21/17   Blane OharaZavitz, Joshua, MD  methocarbamol (ROBAXIN) 500 MG tablet Take 1 tablet (500 mg total) by mouth 2 (two) times daily as needed for muscle spasms. 07/02/15   Everlene Farrieransie, William, PA-C  naproxen (NAPROSYN) 250 MG tablet Take 1 tablet (250 mg total) by mouth 2 (two) times daily with a meal. 07/02/15   Everlene Farrieransie, William, PA-C  ondansetron (ZOFRAN) 4 MG tablet Take 1 tablet (4 mg total) by mouth every 6 (six) hours. 12/17/14   Marlon PelGreene, Tiffany, PA-C  ondansetron (ZOFRAN) 4 MG tablet Take 1 tablet (4 mg total) by mouth every 6 (six) hours. 12/17/14   Marlon PelGreene, Tiffany, PA-C    penicillin v potassium (VEETID) 500 MG tablet Take 1 tablet (500 mg total) by mouth 3 (three) times daily. 12/17/14   Marlon PelGreene, Tiffany, PA-C    Family History History reviewed. No pertinent family history.  Social History Social History   Tobacco Use  . Smoking status: Current Some Day Smoker    Types: Cigarettes  . Smokeless tobacco: Never Used  Substance Use Topics  . Alcohol use: Yes    Alcohol/week: 6.0 standard drinks    Types: 6 Cans of beer per week  . Drug use: No     Allergies   Patient has no known allergies.   Review of Systems Review of Systems  Constitutional: Positive for chills and fever.  HENT: Positive for congestion, sinus pressure and sinus pain. Negative for ear pain and sore throat.   Eyes: Negative for pain, redness, itching and visual disturbance.  Respiratory: Positive for cough, shortness of breath and wheezing.   Cardiovascular: Chest pain: with couhg.  Gastrointestinal: Negative for abdominal pain, diarrhea, nausea and vomiting.  Genitourinary: Negative for dysuria and frequency.  Musculoskeletal: Positive for myalgias. Negative for neck pain and neck stiffness.  Skin: Negative for rash.  Neurological: Positive for light-headedness. Negative for syncope and headaches.  Hematological: Negative for adenopathy.  Psychiatric/Behavioral: Negative for confusion.     Physical Exam Updated Vital Signs BP (!) 155/101 (BP  Location: Left Arm)   Pulse 99   Temp 98.5 F (36.9 C) (Oral)   Resp 20   Ht 5\' 11"  (1.803 m)   Wt 88.5 kg   SpO2 99%   BMI 27.20 kg/m   Physical Exam  Constitutional: He appears well-developed and well-nourished. No distress.  HENT:  Head: Normocephalic and atraumatic.  Right Ear: Tympanic membrane normal.  Left Ear: Tympanic membrane normal.  Nose: Mucosal edema and rhinorrhea present.  Mouth/Throat: Uvula is midline and oropharynx is clear and moist. Posterior oropharyngeal erythema: mild.  Eyes: Pupils are equal,  round, and reactive to light. Conjunctivae and EOM are normal.  Neck: Normal range of motion. Neck supple.  Cardiovascular: Normal rate and regular rhythm.  Pulmonary/Chest: Effort normal. He has wheezes.  Abdominal: Soft. There is no tenderness.  Musculoskeletal: Normal range of motion.  Lymphadenopathy:    He has no cervical adenopathy.  Neurological: He is alert.  Skin: Skin is warm and dry.  Psychiatric: He has a normal mood and affect. His behavior is normal.  Nursing note and vitals reviewed.    ED Treatments / Results  Labs (all labs ordered are listed, but only abnormal results are displayed) Labs Reviewed - No data to display  Radiology Dg Chest 2 View  Result Date: 10/19/2018 CLINICAL DATA:  Fever, cough and chest pain for 3 days. EXAM: CHEST - 2 VIEW COMPARISON:  08/20/2017 and prior exams FINDINGS: The cardiomediastinal silhouette is unremarkable. There is no evidence of focal airspace disease, pulmonary edema, suspicious pulmonary nodule/mass, pleural effusion, or pneumothorax. No acute bony abnormalities are identified. IMPRESSION: No active cardiopulmonary disease. Electronically Signed   By: Harmon Pier M.D.   On: 10/19/2018 09:50    Procedures Procedures (including critical care time)  Medications Ordered in ED Medications  ipratropium-albuterol (DUONEB) 0.5-2.5 (3) MG/3ML nebulizer solution 3 mL (3 mLs Nebulization Given 10/19/18 0945)  chlorpheniramine-HYDROcodone (TUSSIONEX) 10-8 MG/5ML suspension 5 mL (5 mLs Oral Given 10/19/18 0945)     Initial Impression / Assessment and Plan / ED Course  I have reviewed the triage vital signs and the nursing notes. 32 y.o. male here with cough, fever, chills and every day smoker stable for d/c without normal cxr. Since patient has had symptoms x 2 weeks and is an every day smoker will treat with Z-pak and tessalon  for bronchitis. Return precautions discussed with the patient.  Final Clinical Impressions(s) / ED  Diagnoses   Final diagnoses:  Influenza-like illness  Elevated blood pressure reading    ED Discharge Orders         Ordered    benzonatate (TESSALON) 200 MG capsule  3 times daily PRN     10/19/18 1019    azithromycin (ZITHROMAX Z-PAK) 250 MG tablet     10/19/18 1019           Damian Leavell [place], NP 10/19/18 1624    Terrilee Files, MD 10/19/18 1839

## 2018-10-19 NOTE — Discharge Instructions (Signed)
Take tylenol as needed for fever and body aches. Rest and drink plenty of fluids. Call [facility] for follow up. Return here as needed.

## 2018-10-19 NOTE — ED Notes (Signed)
Pt to xray

## 2018-10-19 NOTE — ED Triage Notes (Signed)
Pt arrives to ER with complaints of body aches, fever, cough and congestion since Friday.

## 2019-02-02 ENCOUNTER — Other Ambulatory Visit: Payer: Self-pay

## 2019-02-02 ENCOUNTER — Encounter (HOSPITAL_COMMUNITY): Payer: Self-pay

## 2019-02-02 ENCOUNTER — Emergency Department (HOSPITAL_COMMUNITY)
Admission: EM | Admit: 2019-02-02 | Discharge: 2019-02-02 | Disposition: A | Discharge status: Home / Self Care | Payer: Self-pay | Attending: Emergency Medicine | Admitting: Emergency Medicine

## 2019-02-02 DIAGNOSIS — R0981 Nasal congestion: Secondary | ICD-10-CM | POA: Insufficient documentation

## 2019-02-02 DIAGNOSIS — F17210 Nicotine dependence, cigarettes, uncomplicated: Secondary | ICD-10-CM | POA: Insufficient documentation

## 2019-02-02 DIAGNOSIS — Z79899 Other long term (current) drug therapy: Secondary | ICD-10-CM | POA: Insufficient documentation

## 2019-02-02 DIAGNOSIS — J45909 Unspecified asthma, uncomplicated: Secondary | ICD-10-CM | POA: Insufficient documentation

## 2019-02-02 DIAGNOSIS — R6889 Other general symptoms and signs: Secondary | ICD-10-CM

## 2019-02-02 MED ORDER — OSELTAMIVIR PHOSPHATE 75 MG PO CAPS: 75.0000 mg | ORAL_CAPSULE | Freq: Two times a day (BID) | ORAL | 0 refills | Status: DC

## 2019-02-02 NOTE — Discharge Instructions (Addendum)
Please read attached information. If you experience any new or worsening signs or symptoms please return to the emergency room for evaluation. Please follow-up with your primary care provider or specialist as discussed. Please use medication prescribed only as directed and discontinue taking if you have any concerning signs or symptoms.   °

## 2019-02-02 NOTE — ED Triage Notes (Signed)
Pt reports URI symptoms X2 days. Reports running fever at home. Endorses body aches and nasal congestion. No distress noted.

## 2019-02-02 NOTE — ED Notes (Signed)
Patient verbalizes understanding of discharge instructions. Opportunity for questioning and answers were provided. Armband removed by staff, pt discharged from ED. Ambulated out to lobby  

## 2019-02-02 NOTE — ED Provider Notes (Signed)
[facility] EMERGENCY DEPARTMENT Provider Note   CSN: [id number] Arrival date & time: 02/02/19  9532    History   Chief Complaint Chief Complaint  Patient presents with  . URI    HPI Shane Holmes is a 33 y.o. male.     HPI   33 year old male with a significant past medical history of asthma presents today with complaints of respiratory infection.  Patient notes 3 days ago he developed rhinorrhea nasal congestion, he reports the following day he developed fever.  He notes he has been taking over-the-counter cough and cold medication most recently 3 hours prior to arrival.  Patient notes he feels extremely tired, he notes a dry nonproductive cough with no associated shortness of breath.  She notes he has been around people that were flu positive.  He did not receive the influenza vaccine.  Past Medical History:  Diagnosis Date  . Asthma   . Back pain     There are no active problems to display for this patient.   History reviewed. No pertinent surgical history.      Home Medications    Prior to Admission medications   Medication Sig Start Date End Date Taking? Authorizing Provider  azithromycin (ZITHROMAX Z-PAK) 250 MG tablet Take the first 2 tablets together and then one tablet PO daily 10/19/18   [name], NP  benzonatate (TESSALON) 200 MG capsule Take 1 capsule (200 mg total) by mouth 3 (three) times daily as needed for cough. 10/19/18   [name], NP  dicyclomine (BENTYL) 20 MG tablet Take 1 tablet (20 mg total) by mouth 2 (two) times daily. 03/02/14   [name], PA-C  HYDROcodone-acetaminophen (NORCO) 5-325 MG tablet Take 2 tablets by mouth every 4 (four) hours as needed. 06/21/17   [name], MD  methocarbamol (ROBAXIN) 500 MG tablet Take 1 tablet (500 mg total) by mouth 2 (two) times daily as needed for muscle spasms. 07/02/15   Everlene Farrier, PA-C  naproxen (NAPROSYN) 250 MG tablet Take 1 tablet (250 mg total) by mouth 2 (two)  times daily with a meal. 07/02/15   Everlene Farrier, PA-C  ondansetron (ZOFRAN) 4 MG tablet Take 1 tablet (4 mg total) by mouth every 6 (six) hours. 12/17/14   [name], PA-C  ondansetron (ZOFRAN) 4 MG tablet Take 1 tablet (4 mg total) by mouth every 6 (six) hours. 12/17/14   [name], PA-C  oseltamivir (TAMIFLU) 75 MG capsule Take 1 capsule (75 mg total) by mouth every 12 (twelve) hours. 02/02/19   Urian Martenson, Tinnie Gens, PA-C  penicillin v potassium (VEETID) 500 MG tablet Take 1 tablet (500 mg total) by mouth 3 (three) times daily. 12/17/14   [name], PA-C    Family History History reviewed. No pertinent family history.  Social History Social History   Tobacco Use  . Smoking status: Current Some Day Smoker    Types: Cigarettes  . Smokeless tobacco: Never Used  Substance Use Topics  . Alcohol use: Yes    Alcohol/week: 6.0 standard drinks    Types: 6 Cans of beer per week  . Drug use: No     Allergies   Patient has no known allergies.   Review of Systems Review of Systems  All other systems reviewed and are negative.    Physical Exam Updated Vital Signs BP (!) 153/108 (BP Location: Right Arm)   Pulse (!) 101   Temp 98.3 F (36.8 C) (Oral)   Resp 20  Ht 5\' 11"  (1.803 m)   Wt 86.2 kg   SpO2 99%   BMI 26.50 kg/m   Physical Exam Vitals signs and nursing note reviewed.  Constitutional:      Appearance: He is well-developed.  HENT:     Head: Normocephalic and atraumatic.     Comments: Oropharynx clear no erythema, bilateral TMs normal-rhinorrhea noted Eyes:     General: No scleral icterus.       Right eye: No discharge.        Left eye: No discharge.     Conjunctiva/sclera: Conjunctivae normal.     Pupils: Pupils are equal, round, and reactive to light.  Neck:     Musculoskeletal: Normal range of motion.     Vascular: No JVD.     Trachea: No tracheal deviation.  Pulmonary:     Effort: Pulmonary effort is normal.     Breath sounds: No stridor.      Comments: Lung sounds clear throughout Neurological:     Mental Status: He is alert and oriented to person, place, and time.     Coordination: Coordination normal.  Psychiatric:        Behavior: Behavior normal.        Thought Content: Thought content normal.        Judgment: Judgment normal.      ED Treatments / Results  Labs (all labs ordered are listed, but only abnormal results are displayed) Labs Reviewed - No data to display  EKG None  Radiology No results found.  Procedures Procedures (including critical care time)  Medications Ordered in ED Medications - No data to display   Initial Impression / Assessment and Plan / ED Course  I have reviewed the triage vital signs and the nursing notes.  Pertinent labs & imaging results that were available during my care of the patient were reviewed by me and considered in my medical decision making (see chart for details).        33 year old male presents today with likely influenza.  He has close contacts with similar symptoms tested positive for influenza.  He does have significant past medical history of asthma requiring an inhaler.  He will be discharged home with Tamiflu after discussion of risks and benefits.  He is given strict return precautions.  Verbalized understanding and agreement to today's plan.  Final Clinical Impressions(s) / ED Diagnoses   Final diagnoses:  Flu-like symptoms    ED Discharge Orders         Ordered    oseltamivir (TAMIFLU) 75 MG capsule  Every 12 hours     02/02/19 1040           [name], PA-C 02/02/19 1041    Arby Barrette, MD 02/03/19 1255

## 2019-11-02 ENCOUNTER — Other Ambulatory Visit: Payer: Self-pay

## 2019-11-02 ENCOUNTER — Encounter (HOSPITAL_COMMUNITY): Payer: Self-pay | Admitting: Emergency Medicine

## 2019-11-02 ENCOUNTER — Emergency Department (HOSPITAL_COMMUNITY)
Admission: EM | Admit: 2019-11-02 | Discharge: 2019-11-03 | Disposition: A | Discharge status: Home / Self Care | Payer: Self-pay | Attending: Emergency Medicine | Admitting: Emergency Medicine

## 2019-11-02 ENCOUNTER — Emergency Department (HOSPITAL_COMMUNITY): Payer: Self-pay

## 2019-11-02 DIAGNOSIS — K760 Fatty (change of) liver, not elsewhere classified: Secondary | ICD-10-CM | POA: Insufficient documentation

## 2019-11-02 DIAGNOSIS — J45909 Unspecified asthma, uncomplicated: Secondary | ICD-10-CM | POA: Insufficient documentation

## 2019-11-02 DIAGNOSIS — R0789 Other chest pain: Secondary | ICD-10-CM | POA: Insufficient documentation

## 2019-11-02 DIAGNOSIS — F17210 Nicotine dependence, cigarettes, uncomplicated: Secondary | ICD-10-CM | POA: Insufficient documentation

## 2019-11-02 DIAGNOSIS — R109 Unspecified abdominal pain: Secondary | ICD-10-CM

## 2019-11-02 DIAGNOSIS — F10220 Alcohol dependence with intoxication, uncomplicated: Secondary | ICD-10-CM | POA: Insufficient documentation

## 2019-11-02 DIAGNOSIS — F10920 Alcohol use, unspecified with intoxication, uncomplicated: Secondary | ICD-10-CM

## 2019-11-02 DIAGNOSIS — Y907 Blood alcohol level of 200-239 mg/100 ml: Secondary | ICD-10-CM | POA: Insufficient documentation

## 2019-11-02 LAB — BASIC METABOLIC PANEL
Anion gap: 17 — ABNORMAL HIGH (ref 5–15)
BUN: 5 mg/dL — ABNORMAL LOW (ref 6–20)
CO2: 27 mmol/L (ref 22–32)
Calcium: 9.6 mg/dL (ref 8.9–10.3)
Chloride: 96 mmol/L — ABNORMAL LOW (ref 98–111)
Creatinine, Ser: 0.69 mg/dL (ref 0.61–1.24)
GFR calc Af Amer: >60 mL/min (ref >60)
GFR calc non Af Amer: >60 mL/min (ref >60)
Glucose, Bld: 170 mg/dL — ABNORMAL HIGH (ref 70–99)
Potassium: 3 mmol/L — ABNORMAL LOW (ref 3.5–5.1)
Sodium: 140 mmol/L (ref 135–145)

## 2019-11-02 LAB — URINALYSIS, ROUTINE W REFLEX MICROSCOPIC
Bilirubin Urine: NEGATIVE
Glucose, UA: NEGATIVE mg/dL
Hgb urine dipstick: NEGATIVE
Ketones, ur: NEGATIVE mg/dL
Leukocytes,Ua: NEGATIVE
Nitrite: NEGATIVE
Protein, ur: NEGATIVE mg/dL
Specific Gravity, Urine: 1.002 — ABNORMAL LOW (ref 1.005–1.030)
pH: 6 (ref 5.0–8.0)

## 2019-11-02 LAB — CBC
HCT: 37.3 % — ABNORMAL LOW (ref 39.0–52.0)
Hemoglobin: 12.6 g/dL — ABNORMAL LOW (ref 13.0–17.0)
MCH: 32.1 pg (ref 26.0–34.0)
MCHC: 33.8 g/dL (ref 30.0–36.0)
MCV: 94.9 fL (ref 80.0–100.0)
Platelets: 250 10*3/uL (ref 150–400)
RBC: 3.93 MIL/uL — ABNORMAL LOW (ref 4.22–5.81)
RDW: 16.9 % — ABNORMAL HIGH (ref 11.5–15.5)
WBC: 6.8 10*3/uL (ref 4.0–10.5)
nRBC: 0 % (ref 0.0–0.2)

## 2019-11-02 LAB — HEPATIC FUNCTION PANEL
ALT: 48 U/L — ABNORMAL HIGH (ref 0–44)
AST: 114 U/L — ABNORMAL HIGH (ref 15–41)
Albumin: 4 g/dL (ref 3.5–5.0)
Alkaline Phosphatase: 78 U/L (ref 38–126)
Bilirubin, Direct: 0.3 mg/dL — ABNORMAL HIGH (ref 0.0–0.2)
Indirect Bilirubin: 0.6 mg/dL (ref 0.3–0.9)
Total Bilirubin: 0.9 mg/dL (ref 0.3–1.2)
Total Protein: 7.2 g/dL (ref 6.5–8.1)

## 2019-11-02 LAB — LIPASE, BLOOD: Lipase: 50 U/L (ref 11–51)

## 2019-11-02 LAB — TROPONIN I (HIGH SENSITIVITY)
Troponin I (High Sensitivity): 8 ng/L (ref <18)
Troponin I (High Sensitivity): 7 ng/L (ref <18)

## 2019-11-02 LAB — ETHANOL: Alcohol, Ethyl (B): 211 mg/dL — ABNORMAL HIGH (ref <10)

## 2019-11-02 MED ORDER — POTASSIUM CHLORIDE CRYS ER 20 MEQ PO TBCR
40.0000 meq | EXTENDED_RELEASE_TABLET | Freq: Once | ORAL | Status: AC
  Administered 2019-11-02: 40 meq via ORAL
  Filled 2019-11-02: qty 2

## 2019-11-02 MED ORDER — SODIUM CHLORIDE 0.9 % IV BOLUS (SEPSIS)
2000.0000 mL | Freq: Once | INTRAVENOUS | Status: AC
  Administered 2019-11-02: 2000 mL via INTRAVENOUS

## 2019-11-02 MED ORDER — SODIUM CHLORIDE 0.9 % IV SOLN
1000.0000 mL | INTRAVENOUS | Status: DC
  Administered 2019-11-02: 1000 mL via INTRAVENOUS

## 2019-11-02 MED ORDER — HYDROMORPHONE HCL 1 MG/ML IJ SOLN
1.0000 mg | Freq: Once | INTRAMUSCULAR | Status: AC
  Administered 2019-11-02: 1 mg via INTRAVENOUS
  Filled 2019-11-02: qty 1

## 2019-11-02 MED ORDER — SODIUM CHLORIDE 0.9% FLUSH
3.0000 mL | Freq: Once | INTRAVENOUS | Status: AC
  Administered 2019-11-02: 3 mL via INTRAVENOUS

## 2019-11-02 NOTE — ED Provider Notes (Signed)
[facility]-EMERGENCY DEPT Provider Note   CSN: [id number] Arrival date & time: 11/02/19  2012     History   Chief Complaint Chief Complaint  Patient presents with  . Chest Pain  . Flank Pain    HPI Shane Holmes is a 33 y.o. male.     HPI Pt states the past week or so he has been having difficulty with his breathing.  He feels like his right lung is not working.  He is having pain in the right side of his chest.  It increases with deep breathing.  At baseline it is a 10/10 then with breathing it is 15/10.  No fevers.  Some cough.  He denies any abdominal pain. Past Medical History:  Diagnosis Date  . Asthma   . Back pain     There are no active problems to display for this patient.   History reviewed. No pertinent surgical history.      Home Medications    Prior to Admission medications   Medication Sig Start Date End Date Taking? Authorizing Provider  ibuprofen (ADVIL) 200 MG tablet Take 600-800 mg by mouth every 6 (six) hours as needed for moderate pain.   Yes [provider]  azithromycin (ZITHROMAX Z-PAK) 250 MG tablet Take the first 2 tablets together and then one tablet PO daily Patient not taking: Reported on 11/02/2019 10/19/18   Shane Napoleon, NP  benzonatate (TESSALON) 200 MG capsule Take 1 capsule (200 mg total) by mouth 3 (three) times daily as needed for cough. Patient not taking: Reported on 11/02/2019 10/19/18   Shane Napoleon, NP  dicyclomine (BENTYL) 20 MG tablet Take 1 tablet (20 mg total) by mouth 2 (two) times daily. Patient not taking: Reported on 11/02/2019 03/02/14   [name], PA-C  HYDROcodone-acetaminophen (NORCO) 5-325 MG tablet Take 2 tablets by mouth every 4 (four) hours as needed. Patient not taking: Reported on 11/02/2019 06/21/17   [name], MD  methocarbamol (ROBAXIN) 500 MG tablet Take 1 tablet (500 mg total) by mouth 2 (two) times daily as needed for muscle spasms. Patient not taking: Reported on  11/02/2019 07/02/15   Everlene Farrier, PA-C  naproxen (NAPROSYN) 250 MG tablet Take 1 tablet (250 mg total) by mouth 2 (two) times daily with a meal. Patient not taking: Reported on 11/02/2019 07/02/15   Everlene Farrier, PA-C  ondansetron (ZOFRAN) 4 MG tablet Take 1 tablet (4 mg total) by mouth every 6 (six) hours. Patient not taking: Reported on 11/02/2019 12/17/14   [name], PA-C  ondansetron (ZOFRAN) 4 MG tablet Take 1 tablet (4 mg total) by mouth every 6 (six) hours. Patient not taking: Reported on 11/02/2019 12/17/14   [name], PA-C  oseltamivir (TAMIFLU) 75 MG capsule Take 1 capsule (75 mg total) by mouth every 12 (twelve) hours. Patient not taking: Reported on 11/02/2019 02/02/19   [name], PA-C  penicillin v potassium (VEETID) 500 MG tablet Take 1 tablet (500 mg total) by mouth 3 (three) times daily. Patient not taking: Reported on 11/02/2019 12/17/14   [name], PA-C    Family History History reviewed. No pertinent family history.  Social History Social History   Tobacco Use  . Smoking status: Current Some Day Smoker    Types: Cigarettes  . Smokeless tobacco: Never Used  Substance Use Topics  . Alcohol use: Yes    Alcohol/week: 6.0 standard drinks    Types: 6 Cans of beer per week  . Drug use: No  Patient states he drinks pretty much every day   Allergies   Patient has no known allergies.   Review of Systems Review of Systems  Respiratory: Positive for shortness of breath.   Cardiovascular: Positive for chest pain.  Genitourinary: Negative for dysuria.  All other systems reviewed and are negative.    Physical Exam Updated Vital Signs BP 123/78 (BP Location: Right Arm)   Pulse (!) 112   Temp 98.6 F (37 C) (Oral)   Resp 15   Ht 1.702 m (5\' 7" )   Wt 90.7 kg   SpO2 96%   BMI 31.32 kg/m   Physical Exam Vitals signs and nursing note reviewed.  Constitutional:      General: He is not in acute distress.    Appearance: He is  well-developed.  HENT:     Head: Normocephalic and atraumatic.     Right Ear: External ear normal.     Left Ear: External ear normal.  Eyes:     General: No scleral icterus.       Right eye: No discharge.        Left eye: No discharge.     Conjunctiva/sclera: Conjunctivae normal.  Neck:     Musculoskeletal: Neck supple.     Trachea: No tracheal deviation.  Cardiovascular:     Rate and Rhythm: Regular rhythm. Tachycardia present.  Pulmonary:     Effort: Pulmonary effort is normal. No respiratory distress.     Breath sounds: Normal breath sounds. No stridor. No wheezing or rales.     Comments: Equal breath sounds bilaterally, no rales Abdominal:     General: Bowel sounds are normal. There is no distension.     Palpations: Abdomen is soft.     Tenderness: There is abdominal tenderness. There is no guarding or rebound.     Comments: Epigastric tenderness  Musculoskeletal:        General: No tenderness.  Skin:    General: Skin is warm and dry.     Findings: No rash.  Neurological:     Mental Status: He is alert.     Cranial Nerves: No cranial nerve deficit (no facial droop, extraocular movements intact, no slurred speech).     Sensory: No sensory deficit.     Motor: No abnormal muscle tone or seizure activity.     Coordination: Coordination normal.      ED Treatments / Results  Labs (all labs ordered are listed, but only abnormal results are displayed) Labs Reviewed  URINALYSIS, ROUTINE W REFLEX MICROSCOPIC - Abnormal; Notable for the following components:      Result Value   Color, Urine STRAW (*)    Specific Gravity, Urine 1.002 (*)    All other components within normal limits  BASIC METABOLIC PANEL - Abnormal; Notable for the following components:   Potassium 3.0 (*)    Chloride 96 (*)    Glucose, Bld 170 (*)    BUN 5 (*)    Anion gap 17 (*)    All other components within normal limits  CBC - Abnormal; Notable for the following components:   RBC 3.93 (*)     Hemoglobin 12.6 (*)    HCT 37.3 (*)    RDW 16.9 (*)    All other components within normal limits  HEPATIC FUNCTION PANEL  LIPASE, BLOOD  ETHANOL  TROPONIN I (HIGH SENSITIVITY)  TROPONIN I (HIGH SENSITIVITY)    EKG EKG Interpretation  Date/Time:  Tuesday November 02 2019 20:53:02 EST Ventricular Rate:  118 PR Interval:    QRS Duration: 93 QT Interval:  337 QTC Calculation: 475 R Axis:   50 Text Interpretation: Sinus tachycardia Consider right atrial enlargement Borderline prolonged QT interval Baseline wander in lead(s) V2 No significant change since last tracing Confirmed by Linwood DibblesKnapp, Cindy Fullman [phone]) on 11/02/2019 8:56:30 PM   Radiology Dg Chest 2 View  Result Date: 11/02/2019 CLINICAL DATA:  Chest pain EXAM: CHEST - 2 VIEW COMPARISON:  October 19, 2018 FINDINGS: The heart size and mediastinal contours are within normal limits. Both lungs are clear. The visualized skeletal structures are unremarkable. IMPRESSION: No active cardiopulmonary disease. Electronically Signed   By: Jonna ClarkBindu  Avutu M.D.   On: 11/02/2019 20:45    Procedures Procedures (including critical care time)  Medications Ordered in ED Medications  sodium chloride 0.9 % bolus 2,000 mL (2,000 mLs Intravenous New Bag/Given 11/02/19 2253)    Followed by  0.9 %  sodium chloride infusion (1,000 mLs Intravenous New Bag/Given 11/02/19 2252)  sodium chloride flush (NS) 0.9 % injection 3 mL (3 mLs Intravenous Given 11/02/19 2252)  HYDROmorphone (DILAUDID) injection 1 mg (1 mg Intravenous Given 11/02/19 2253)  potassium chloride SA (KLOR-CON) CR tablet 40 mEq (40 mEq Oral Given 11/02/19 2251)     Initial Impression / Assessment and Plan / ED Course  I have reviewed the triage vital signs and the nursing notes.  Pertinent labs & imaging results that were available during my care of the patient were reviewed by me and considered in my medical decision making (see chart for details).    Patient presents with complaints of chest  pain.  Chest x-ray does not show pneumothorax or pneumonia.  Patient does admit to daily alcohol use.  He is tachycardic.  Concern he might have pancreatitis.  His lipase is normal and plan on proceeding with CT scan of his chest to evaluate for pulmonary embolism.  Care will be turned over to Dr. Erin HearingMessner.     Final Clinical Impressions(s) / ED Diagnoses  pending   Linwood DibblesKnapp, Colandra Ohanian, MD 11/02/19 2337

## 2019-11-02 NOTE — ED Provider Notes (Signed)
11:59 PM Assumed care from Dr. Tomi Bamberger, please see their note for full history, physical and decision making until this point. In brief this is a 33 y.o. year old male who presented to the ED tonight with Chest Pain and Flank Pain     R CP, 15/10. Tachycardia. Seemed intoxicated.   Pending ct c/a/p and clinically sober.   Ambulate without difficulty.  Is tolerating p.o. intake without difficulty.  His work-up ultimately revealed what appears to be fatty liver disease and hepatic megaly that might be causing his pain.  No evidence of PE, pneumonia, traumatic injury or other acute emergent cause at this time.  Pain is better but not totally gone however patient does appear to be stabilized and can be discharged to follow-up with gastroenterology at this time.  Discharge instructions, including strict return precautions for new or worsening symptoms, given. Patient and/or family verbalized understanding and agreement with the plan as described.   Labs, studies and imaging reviewed by myself and considered in medical decision making if ordered. Imaging interpreted by radiology.  Labs Reviewed  URINALYSIS, ROUTINE W REFLEX MICROSCOPIC - Abnormal; Notable for the following components:      Result Value   Color, Urine STRAW (*)    Specific Gravity, Urine 1.002 (*)    All other components within normal limits  BASIC METABOLIC PANEL - Abnormal; Notable for the following components:   Potassium 3.0 (*)    Chloride 96 (*)    Glucose, Bld 170 (*)    BUN 5 (*)    Anion gap 17 (*)    All other components within normal limits  CBC - Abnormal; Notable for the following components:   RBC 3.93 (*)    Hemoglobin 12.6 (*)    HCT 37.3 (*)    RDW 16.9 (*)    All other components within normal limits  HEPATIC FUNCTION PANEL - Abnormal; Notable for the following components:   AST 114 (*)    ALT 48 (*)    Bilirubin, Direct 0.3 (*)    All other components within normal limits  ETHANOL - Abnormal; Notable  for the following components:   Alcohol, Ethyl (B) 211 (*)    All other components within normal limits  LIPASE, BLOOD  TROPONIN I (HIGH SENSITIVITY)  TROPONIN I (HIGH SENSITIVITY)    DG Chest 2 View  Final Result      No follow-ups on file.    Vania Rosero, Corene Cornea, MD 11/03/19 [phone]

## 2019-11-02 NOTE — ED Triage Notes (Signed)
Patient brought in by [facility] from home. Patient is complaining of right chest pain and right flank pain. Patient states it started 4 days ago. Patient does not know what happened.

## 2019-11-03 ENCOUNTER — Encounter (HOSPITAL_COMMUNITY): Payer: Self-pay

## 2019-11-03 ENCOUNTER — Emergency Department (HOSPITAL_COMMUNITY): Payer: Self-pay

## 2019-11-03 LAB — HEPATITIS PANEL, ACUTE
HCV Ab: NONREACTIVE
Hep A IgM: NONREACTIVE
Hep B C IgM: REACTIVE — AB
Hepatitis B Surface Ag: NONREACTIVE

## 2019-11-03 LAB — ACETAMINOPHEN LEVEL: Acetaminophen (Tylenol), Serum: <10 ug/mL — ABNORMAL LOW (ref 10–30)

## 2019-11-03 MED ORDER — SODIUM CHLORIDE (PF) 0.9 % IJ SOLN
INTRAMUSCULAR | Status: AC
  Filled 2019-11-03: qty 50

## 2019-11-03 MED ORDER — HYDROMORPHONE HCL 1 MG/ML IJ SOLN
1.0000 mg | Freq: Once | INTRAMUSCULAR | Status: AC
  Administered 2019-11-03: 1 mg via INTRAVENOUS
  Filled 2019-11-03: qty 1

## 2019-11-03 MED ORDER — IOHEXOL 350 MG/ML SOLN
100.0000 mL | Freq: Once | INTRAVENOUS | Status: AC | PRN
  Administered 2019-11-03: 100 mL via INTRAVENOUS

## 2019-11-03 NOTE — ED Notes (Signed)
Called lab labs added on

## 2019-11-03 NOTE — ED Notes (Signed)
Pt ambulated to the restroom without assistance

## 2019-11-03 NOTE — ED Notes (Signed)
[place] in the room

## 2020-09-20 ENCOUNTER — Emergency Department (HOSPITAL_COMMUNITY)
Admission: EM | Admit: 2020-09-20 | Discharge: 2020-09-20 | Disposition: A | Discharge status: Home / Self Care | Payer: Self-pay | Attending: Emergency Medicine | Admitting: Emergency Medicine

## 2020-09-20 ENCOUNTER — Other Ambulatory Visit: Payer: Self-pay

## 2020-09-20 ENCOUNTER — Encounter (HOSPITAL_COMMUNITY): Payer: Self-pay | Admitting: Emergency Medicine

## 2020-09-20 ENCOUNTER — Emergency Department (HOSPITAL_COMMUNITY): Payer: Self-pay

## 2020-09-20 DIAGNOSIS — M25531 Pain in right wrist: Secondary | ICD-10-CM | POA: Insufficient documentation

## 2020-09-20 DIAGNOSIS — F17210 Nicotine dependence, cigarettes, uncomplicated: Secondary | ICD-10-CM | POA: Insufficient documentation

## 2020-09-20 DIAGNOSIS — J45909 Unspecified asthma, uncomplicated: Secondary | ICD-10-CM | POA: Insufficient documentation

## 2020-09-20 MED ORDER — OXYCODONE-ACETAMINOPHEN 5-325 MG PO TABS
1.0000 | ORAL_TABLET | Freq: Once | ORAL | Status: AC
  Administered 2020-09-20: 1 via ORAL
  Filled 2020-09-20: qty 1
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
## 2020-09-20 NOTE — ED Triage Notes (Signed)
Pt states he tripped over a pallet on Sunday and fell in gravel.  C/o pain to R wrist.

## 2020-09-20 NOTE — Discharge Instructions (Signed)
At this time there does not appear to be the presence of an emergent medical condition, however there is always the potential for conditions to change. Please read and follow the below instructions.  Please return to the Emergency Department immediately for any new or worsening symptoms. Please be sure to follow up with your Primary Care Provider within one week regarding your visit today; please call their office to schedule an appointment even if you are feeling better for a follow-up visit. Please use the wrist splint given to you today to protect your wrist from further injury.  Use rest ice and elevation to help with pain.  Please call the orthopedic specialist Dr. Janee Morn on your discharge paperwork for a follow-up visit.  As we discussed if your symptoms not improve you may need repeat imaging to assess for unseen fracture or other injuries.  Go to the nearest Emergency Department immediately if: You have fever or chills You lose feeling in your fingers or hand. Your fingers turn white, very red, or cold and blue. You cannot move your fingers. You have any new/concerning or worsening of symptoms  Please read the additional information packets attached to your discharge summary.  Do not take your medicine if  develop an itchy rash, swelling in your mouth or lips, or difficulty breathing; call 911 and seek immediate emergency medical attention if this occurs.  You may review your lab tests and imaging results in their entirety on your MyChart account.  Please discuss all results of fully with your primary care provider and other specialist at your follow-up visit.  Note: Portions of this text may have been transcribed using voice recognition software. Every effort was made to ensure accuracy; however, inadvertent computerized transcription errors may still be present.

## 2021-01-22 ENCOUNTER — Encounter (HOSPITAL_COMMUNITY): Payer: Self-pay | Admitting: Emergency Medicine

## 2021-01-22 ENCOUNTER — Emergency Department (HOSPITAL_COMMUNITY): Payer: Self-pay

## 2021-01-22 ENCOUNTER — Inpatient Hospital Stay (HOSPITAL_COMMUNITY)
Admission: EM | Admit: 2021-01-22 | Discharge: 2021-01-26 | DRG: 178 | Disposition: A | Discharge status: Home / Self Care | Payer: Self-pay | Attending: Internal Medicine | Admitting: Internal Medicine

## 2021-01-22 DIAGNOSIS — F10230 Alcohol dependence with withdrawal, uncomplicated: Secondary | ICD-10-CM | POA: Diagnosis present

## 2021-01-22 DIAGNOSIS — K5900 Constipation, unspecified: Secondary | ICD-10-CM

## 2021-01-22 DIAGNOSIS — F10930 Alcohol use, unspecified with withdrawal, uncomplicated: Secondary | ICD-10-CM

## 2021-01-22 DIAGNOSIS — F17210 Nicotine dependence, cigarettes, uncomplicated: Secondary | ICD-10-CM | POA: Diagnosis present

## 2021-01-22 DIAGNOSIS — K602 Anal fissure, unspecified: Secondary | ICD-10-CM

## 2021-01-22 DIAGNOSIS — G621 Alcoholic polyneuropathy: Secondary | ICD-10-CM | POA: Diagnosis present

## 2021-01-22 DIAGNOSIS — R739 Hyperglycemia, unspecified: Secondary | ICD-10-CM | POA: Diagnosis not present

## 2021-01-22 DIAGNOSIS — Z8619 Personal history of other infectious and parasitic diseases: Secondary | ICD-10-CM

## 2021-01-22 DIAGNOSIS — A0839 Other viral enteritis: Secondary | ICD-10-CM | POA: Diagnosis present

## 2021-01-22 DIAGNOSIS — R112 Nausea with vomiting, unspecified: Secondary | ICD-10-CM

## 2021-01-22 DIAGNOSIS — J45901 Unspecified asthma with (acute) exacerbation: Secondary | ICD-10-CM | POA: Diagnosis present

## 2021-01-22 DIAGNOSIS — E871 Hypo-osmolality and hyponatremia: Secondary | ICD-10-CM | POA: Diagnosis present

## 2021-01-22 DIAGNOSIS — G609 Hereditary and idiopathic neuropathy, unspecified: Secondary | ICD-10-CM

## 2021-01-22 DIAGNOSIS — Z720 Tobacco use: Secondary | ICD-10-CM | POA: Diagnosis present

## 2021-01-22 DIAGNOSIS — K921 Melena: Secondary | ICD-10-CM

## 2021-01-22 DIAGNOSIS — E876 Hypokalemia: Secondary | ICD-10-CM | POA: Diagnosis present

## 2021-01-22 DIAGNOSIS — U071 COVID-19: Principal | ICD-10-CM | POA: Diagnosis present

## 2021-01-22 DIAGNOSIS — F10239 Alcohol dependence with withdrawal, unspecified: Secondary | ICD-10-CM

## 2021-01-22 DIAGNOSIS — F1010 Alcohol abuse, uncomplicated: Secondary | ICD-10-CM

## 2021-01-22 DIAGNOSIS — D62 Acute posthemorrhagic anemia: Secondary | ICD-10-CM

## 2021-01-22 DIAGNOSIS — R197 Diarrhea, unspecified: Secondary | ICD-10-CM

## 2021-01-22 DIAGNOSIS — R29898 Other symptoms and signs involving the musculoskeletal system: Secondary | ICD-10-CM

## 2021-01-22 DIAGNOSIS — K922 Gastrointestinal hemorrhage, unspecified: Secondary | ICD-10-CM | POA: Diagnosis present

## 2021-01-22 DIAGNOSIS — E860 Dehydration: Secondary | ICD-10-CM | POA: Diagnosis present

## 2021-01-22 DIAGNOSIS — J4521 Mild intermittent asthma with (acute) exacerbation: Secondary | ICD-10-CM

## 2021-01-22 DIAGNOSIS — F10939 Alcohol use, unspecified with withdrawal, unspecified: Secondary | ICD-10-CM | POA: Diagnosis present

## 2021-01-22 LAB — TYPE AND SCREEN
ABO/RH(D): O POS
Antibody Screen: NEGATIVE

## 2021-01-22 LAB — LIPASE, BLOOD: Lipase: 42 U/L (ref 11–51)

## 2021-01-22 LAB — BASIC METABOLIC PANEL
Anion gap: 11 (ref 5–15)
BUN: 8 mg/dL (ref 6–20)
CO2: 25 mmol/L (ref 22–32)
Calcium: 8.3 mg/dL — ABNORMAL LOW (ref 8.9–10.3)
Chloride: 92 mmol/L — ABNORMAL LOW (ref 98–111)
Creatinine, Ser: 0.94 mg/dL (ref 0.61–1.24)
GFR, Estimated: >60 mL/min (ref >60)
Glucose, Bld: 111 mg/dL — ABNORMAL HIGH (ref 70–99)
Potassium: 2.8 mmol/L — ABNORMAL LOW (ref 3.5–5.1)
Sodium: 128 mmol/L — ABNORMAL LOW (ref 135–145)

## 2021-01-22 LAB — COMPREHENSIVE METABOLIC PANEL
ALT: 25 U/L (ref 0–44)
AST: 50 U/L — ABNORMAL HIGH (ref 15–41)
Albumin: 3.4 g/dL — ABNORMAL LOW (ref 3.5–5.0)
Alkaline Phosphatase: 62 U/L (ref 38–126)
Anion gap: 12 (ref 5–15)
BUN: 5 mg/dL — ABNORMAL LOW (ref 6–20)
CO2: 26 mmol/L (ref 22–32)
Calcium: 8.6 mg/dL — ABNORMAL LOW (ref 8.9–10.3)
Chloride: 92 mmol/L — ABNORMAL LOW (ref 98–111)
Creatinine, Ser: 0.79 mg/dL (ref 0.61–1.24)
GFR, Estimated: >60 mL/min (ref >60)
Glucose, Bld: 150 mg/dL — ABNORMAL HIGH (ref 70–99)
Potassium: 2.9 mmol/L — ABNORMAL LOW (ref 3.5–5.1)
Sodium: 130 mmol/L — ABNORMAL LOW (ref 135–145)
Total Bilirubin: 2 mg/dL — ABNORMAL HIGH (ref 0.3–1.2)
Total Protein: 6.4 g/dL — ABNORMAL LOW (ref 6.5–8.1)

## 2021-01-22 LAB — ABO/RH: ABO/RH(D): O POS

## 2021-01-22 LAB — RESP PANEL BY RT-PCR (FLU A&B, COVID) ARPGX2
Influenza A by PCR: NEGATIVE
Influenza B by PCR: NEGATIVE
SARS Coronavirus 2 by RT PCR: POSITIVE — AB

## 2021-01-22 LAB — CBC
HCT: 33.4 % — ABNORMAL LOW (ref 39.0–52.0)
HCT: 28.5 % — ABNORMAL LOW (ref 39.0–52.0)
Hemoglobin: 11.8 g/dL — ABNORMAL LOW (ref 13.0–17.0)
Hemoglobin: 10.3 g/dL — ABNORMAL LOW (ref 13.0–17.0)
MCH: 33.3 pg (ref 26.0–34.0)
MCH: 34 pg (ref 26.0–34.0)
MCHC: 35.3 g/dL (ref 30.0–36.0)
MCHC: 36.1 g/dL — ABNORMAL HIGH (ref 30.0–36.0)
MCV: 94.4 fL (ref 80.0–100.0)
MCV: 94.1 fL (ref 80.0–100.0)
Platelets: 240 10*3/uL (ref 150–400)
Platelets: 185 10*3/uL (ref 150–400)
RBC: 3.54 MIL/uL — ABNORMAL LOW (ref 4.22–5.81)
RBC: 3.03 MIL/uL — ABNORMAL LOW (ref 4.22–5.81)
RDW: 17.5 % — ABNORMAL HIGH (ref 11.5–15.5)
RDW: 17.6 % — ABNORMAL HIGH (ref 11.5–15.5)
WBC: 9.5 10*3/uL (ref 4.0–10.5)
WBC: 7.3 10*3/uL (ref 4.0–10.5)
nRBC: 0 % (ref 0.0–0.2)
nRBC: 0 % (ref 0.0–0.2)

## 2021-01-22 LAB — LACTATE DEHYDROGENASE: LDH: 185 U/L (ref 98–192)

## 2021-01-22 LAB — URINALYSIS, ROUTINE W REFLEX MICROSCOPIC
Bilirubin Urine: NEGATIVE
Glucose, UA: NEGATIVE mg/dL
Hgb urine dipstick: NEGATIVE
Ketones, ur: 5 mg/dL — AB
Leukocytes,Ua: NEGATIVE
Nitrite: NEGATIVE
Protein, ur: NEGATIVE mg/dL
Specific Gravity, Urine: 1.02 (ref 1.005–1.030)
pH: 6 (ref 5.0–8.0)

## 2021-01-22 LAB — CK: Total CK: 131 U/L (ref 49–397)

## 2021-01-22 LAB — MAGNESIUM: Magnesium: 2 mg/dL (ref 1.7–2.4)

## 2021-01-22 LAB — FIBRINOGEN: Fibrinogen: 192 mg/dL — ABNORMAL LOW (ref 210–475)

## 2021-01-22 LAB — PROTIME-INR
INR: 1.1 (ref 0.8–1.2)
Prothrombin Time: 13.9 seconds (ref 11.4–15.2)

## 2021-01-22 LAB — FERRITIN: Ferritin: 229 ng/mL (ref 24–336)

## 2021-01-22 LAB — C-REACTIVE PROTEIN: CRP: 0.6 mg/dL (ref <1.0)

## 2021-01-22 LAB — D-DIMER, QUANTITATIVE: D-Dimer, Quant: <0.27 ug/mL-FEU (ref 0.00–0.50)

## 2021-01-22 LAB — HIV ANTIBODY (ROUTINE TESTING W REFLEX): HIV Screen 4th Generation wRfx: NONREACTIVE

## 2021-01-22 LAB — PHOSPHORUS: Phosphorus: 3.9 mg/dL (ref 2.5–4.6)

## 2021-01-22 MED ORDER — ACETAMINOPHEN 325 MG PO TABS
650.0000 mg | ORAL_TABLET | Freq: Four times a day (QID) | ORAL | Status: DC | PRN
  Administered 2021-01-23: 650 mg via ORAL
  Filled 2021-01-22: qty 2

## 2021-01-22 MED ORDER — SODIUM CHLORIDE 0.9% FLUSH
3.0000 mL | Freq: Two times a day (BID) | INTRAVENOUS | Status: DC
  Administered 2021-01-22 – 2021-01-26 (×7): 3 mL via INTRAVENOUS

## 2021-01-22 MED ORDER — POTASSIUM CHLORIDE 10 MEQ/100ML IV SOLN
10.0000 meq | Freq: Once | INTRAVENOUS | Status: AC
  Administered 2021-01-22: 10 meq via INTRAVENOUS
  Filled 2021-01-22: qty 100

## 2021-01-22 MED ORDER — ALBUTEROL SULFATE HFA 108 (90 BASE) MCG/ACT IN AERS
2.0000 | INHALATION_SPRAY | Freq: Four times a day (QID) | RESPIRATORY_TRACT | Status: DC
  Administered 2021-01-23 – 2021-01-26 (×13): 2 via RESPIRATORY_TRACT
  Filled 2021-01-22: qty 6.7

## 2021-01-22 MED ORDER — SODIUM CHLORIDE 0.9 % IV SOLN
100.0000 mg | Freq: Every day | INTRAVENOUS | Status: AC
  Administered 2021-01-23 – 2021-01-26 (×4): 100 mg via INTRAVENOUS
  Filled 2021-01-22 (×4): qty 20

## 2021-01-22 MED ORDER — ONDANSETRON HCL 4 MG/2ML IJ SOLN
4.0000 mg | Freq: Once | INTRAMUSCULAR | Status: AC
  Administered 2021-01-22: 4 mg via INTRAVENOUS
  Filled 2021-01-22: qty 2

## 2021-01-22 MED ORDER — ADULT MULTIVITAMIN W/MINERALS CH
1.0000 | ORAL_TABLET | Freq: Every day | ORAL | Status: DC
  Administered 2021-01-22 – 2021-01-26 (×5): 1 via ORAL
  Filled 2021-01-22 (×6): qty 1

## 2021-01-22 MED ORDER — THIAMINE HCL 100 MG PO TABS
100.0000 mg | ORAL_TABLET | Freq: Every day | ORAL | Status: DC
  Administered 2021-01-22 – 2021-01-26 (×5): 100 mg via ORAL
  Filled 2021-01-22 (×7): qty 1

## 2021-01-22 MED ORDER — SODIUM CHLORIDE 0.9 % IV SOLN: INTRAVENOUS | Status: AC

## 2021-01-22 MED ORDER — NICOTINE 21 MG/24HR TD PT24
21.0000 mg | MEDICATED_PATCH | Freq: Every day | TRANSDERMAL | Status: DC
  Administered 2021-01-22 – 2021-01-26 (×5): 21 mg via TRANSDERMAL
  Filled 2021-01-22 (×6): qty 1

## 2021-01-22 MED ORDER — LORAZEPAM 2 MG/ML IJ SOLN
1.0000 mg | Freq: Once | INTRAMUSCULAR | Status: AC
  Administered 2021-01-22: 1 mg via INTRAVENOUS
  Filled 2021-01-22: qty 1

## 2021-01-22 MED ORDER — ONDANSETRON HCL 4 MG/2ML IJ SOLN: 4.0000 mg | Freq: Four times a day (QID) | INTRAMUSCULAR | Status: DC | PRN

## 2021-01-22 MED ORDER — ZINC SULFATE 220 (50 ZN) MG PO CAPS
220.0000 mg | ORAL_CAPSULE | Freq: Every day | ORAL | Status: DC
  Administered 2021-01-22 – 2021-01-26 (×5): 220 mg via ORAL
  Filled 2021-01-22 (×5): qty 1

## 2021-01-22 MED ORDER — ALBUTEROL SULFATE HFA 108 (90 BASE) MCG/ACT IN AERS
2.0000 | INHALATION_SPRAY | RESPIRATORY_TRACT | Status: DC | PRN
  Administered 2021-01-22 – 2021-01-24 (×3): 2 via RESPIRATORY_TRACT
  Filled 2021-01-22: qty 6.7

## 2021-01-22 MED ORDER — LORAZEPAM 1 MG PO TABS: 0.0000 mg | ORAL_TABLET | Freq: Four times a day (QID) | ORAL | Status: AC

## 2021-01-22 MED ORDER — ONDANSETRON HCL 4 MG PO TABS: 4.0000 mg | ORAL_TABLET | Freq: Four times a day (QID) | ORAL | Status: DC | PRN

## 2021-01-22 MED ORDER — KETOROLAC TROMETHAMINE 15 MG/ML IJ SOLN
15.0000 mg | Freq: Once | INTRAMUSCULAR | Status: AC
  Administered 2021-01-22: 15 mg via INTRAVENOUS
  Filled 2021-01-22: qty 1

## 2021-01-22 MED ORDER — ASCORBIC ACID 500 MG PO TABS
500.0000 mg | ORAL_TABLET | Freq: Every day | ORAL | Status: DC
  Administered 2021-01-22 – 2021-01-26 (×5): 500 mg via ORAL
  Filled 2021-01-22 (×5): qty 1

## 2021-01-22 MED ORDER — LACTATED RINGERS IV BOLUS
1000.0000 mL | Freq: Once | INTRAVENOUS | Status: AC
  Administered 2021-01-22: 1000 mL via INTRAVENOUS

## 2021-01-22 MED ORDER — ACETAMINOPHEN 500 MG PO TABS
1000.0000 mg | ORAL_TABLET | Freq: Once | ORAL | Status: AC
  Administered 2021-01-22: 1000 mg via ORAL
  Filled 2021-01-22: qty 2

## 2021-01-22 MED ORDER — METHYLPREDNISOLONE SODIUM SUCC 125 MG IJ SOLR
60.0000 mg | Freq: Two times a day (BID) | INTRAMUSCULAR | Status: DC
  Administered 2021-01-22 – 2021-01-23 (×2): 60 mg via INTRAVENOUS
  Filled 2021-01-22 (×2): qty 2

## 2021-01-22 MED ORDER — SODIUM CHLORIDE 0.9 % IV BOLUS
1000.0000 mL | Freq: Once | INTRAVENOUS | Status: AC
  Administered 2021-01-22: 1000 mL via INTRAVENOUS

## 2021-01-22 MED ORDER — HYDROCODONE-ACETAMINOPHEN 5-325 MG PO TABS
1.0000 | ORAL_TABLET | ORAL | Status: DC | PRN
  Administered 2021-01-22 – 2021-01-23 (×2): 2 via ORAL
  Administered 2021-01-23: 1 via ORAL
  Administered 2021-01-23: 2 via ORAL
  Filled 2021-01-22 (×4): qty 2

## 2021-01-22 MED ORDER — HYDROCOD POLST-CPM POLST ER 10-8 MG/5ML PO SUER
5.0000 mL | Freq: Two times a day (BID) | ORAL | Status: DC | PRN
Start: 2021-01-22 — End: 2021-01-26

## 2021-01-22 MED ORDER — LORAZEPAM 2 MG/ML IJ SOLN: 0.0000 mg | Freq: Two times a day (BID) | INTRAMUSCULAR | Status: DC

## 2021-01-22 MED ORDER — SODIUM CHLORIDE 0.9 % IV SOLN
200.0000 mg | Freq: Once | INTRAVENOUS | Status: AC
  Administered 2021-01-22: 200 mg via INTRAVENOUS
  Filled 2021-01-22: qty 40

## 2021-01-22 MED ORDER — LORAZEPAM 1 MG PO TABS: 0.0000 mg | ORAL_TABLET | Freq: Two times a day (BID) | ORAL | Status: DC

## 2021-01-22 MED ORDER — BENZONATATE 100 MG PO CAPS
100.0000 mg | ORAL_CAPSULE | Freq: Once | ORAL | Status: AC
  Administered 2021-01-22: 100 mg via ORAL
  Filled 2021-01-22: qty 1

## 2021-01-22 MED ORDER — LORAZEPAM 2 MG/ML IJ SOLN: 0.0000 mg | Freq: Four times a day (QID) | INTRAMUSCULAR | Status: AC

## 2021-01-22 MED ORDER — THIAMINE HCL 100 MG/ML IJ SOLN: 100.0000 mg | Freq: Every day | INTRAMUSCULAR | Status: DC

## 2021-01-22 MED ORDER — PANTOPRAZOLE SODIUM 40 MG IV SOLR
40.0000 mg | Freq: Two times a day (BID) | INTRAVENOUS | Status: DC
  Administered 2021-01-22 – 2021-01-23 (×2): 40 mg via INTRAVENOUS
  Filled 2021-01-22 (×2): qty 40

## 2021-01-22 NOTE — ED Triage Notes (Addendum)
Pt reports 3 days of blood in stool, nausea vomiting and bilateral leg swelling. Pt states he feels weak all over. He drinks about 12 beers a day. Last drink was last night, he only drink 2 beers yesterday due to feeling so poorly.

## 2021-01-22 NOTE — H&P (Signed)
Shane Holmes ZOX:[id number] DOB: 1986/03/13 DOA: 01/22/2021     PCP: Patient, No Pcp Per   Outpatient Specialists:   NONE   Patient arrived to ER on 01/22/21 at 1154 Referred by Attending Margarita Grizzle, MD   Patient coming from: home Lives  With family    Chief Complaint:   Chief Complaint  Patient presents with  . GI Bleeding    N/v     HPI: Shane Holmes is a 35 y.o. male with medical history significant of etoh abuse, asthma    Presenfatigueted with   3 days of blood in stool, nausea vomiting and bilateral leg swelling and pain Reports blood in stool was intermittent Reports no clots just some bright red blood when passing stool Reports legs cramps Decreased Po intake  Reports cough EtOh use 12 beers /day Last EtOh last night 2 beers No sick contacts  Smokes tobacco  Had hx of EtoH withdraw  Infectious risk factors:  Reports  fever, shortness of breath, dry cough, chest pain, Sore throat, URI symptoms, anosmia/change in taste, N/V/Diarrhea, abdominal pain Body aches, severe fatigue     Has NOt been vaccinated against COVID    Initial COVID TEST    POSITIVE,    Lab Results  Component Value Date   SARSCOV2NAA POSITIVE (A) 01/22/2021     Regarding pertinent Chronic problems:     Asthma -well  controlled on home inhalers    While in ER: CXR - Non acute  Hospitalist was called for admission for Lower Gi bleed, COVID infection  The following Work up has been ordered so far:  Orders Placed This Encounter  Procedures  . Resp Panel by RT-PCR (Flu A&B, Covid) Nasopharyngeal Swab  . DG Chest Portable 1 View  . Lipase, blood  . Comprehensive metabolic panel  . CBC  . Urinalysis, Routine w reflex microscopic  . Magnesium  . Protime-INR  . Diet regular Room service appropriate? Yes; Fluid consistency: Thin  . Clinical institute withdrawal assessment  . Consult for Unassigned Medical Admission  ALL PATIENTS BEING ADMITTED/HAVING PROCEDURES NEED COVID-19  SCREENING  . Airborne and Contact precautions  . EKG 12-Lead  . EKG 12-Lead  . Type and screen [facility]  . ABO/Rh     Following Medications were ordered in ER: Medications  sodium chloride 0.9 % bolus 1,000 mL (has no administration in time range)  lactated ringers bolus 1,000 mL (0 mLs Intravenous Stopped 01/22/21 1653)  ondansetron (ZOFRAN) injection 4 mg (4 mg Intravenous Given 01/22/21 1535)  LORazepam (ATIVAN) injection 1 mg (1 mg Intravenous Given 01/22/21 1537)  potassium chloride 10 mEq in 100 mL IVPB (0 mEq Intravenous Stopped 01/22/21 1640)      Significant initial  Findings: Abnormal Labs Reviewed  RESP PANEL BY RT-PCR (FLU A&B, COVID) ARPGX2 - Abnormal; Notable for the following components:      Result Value   SARS Coronavirus 2 by RT PCR POSITIVE (*)    All other components within normal limits  COMPREHENSIVE METABOLIC PANEL - Abnormal; Notable for the following components:   Sodium 130 (*)    Potassium 2.9 (*)    Chloride 92 (*)    Glucose, Bld 150 (*)    BUN 5 (*)    Calcium 8.6 (*)    Total Protein 6.4 (*)    Albumin 3.4 (*)    AST 50 (*)    Total Bilirubin 2.0 (*)    All other components within normal  limits  CBC - Abnormal; Notable for the following components:   RBC 3.54 (*)    Hemoglobin 11.8 (*)    HCT 33.4 (*)    RDW 17.5 (*)    All other components within normal limits  URINALYSIS, ROUTINE W REFLEX MICROSCOPIC - Abnormal; Notable for the following components:   Color, Urine AMBER (*)    Ketones, ur 5 (*)    All other components within normal limits   Otherwise labs showing:    Recent Labs  Lab 01/22/21 1217 01/22/21 1533  NA 130*  --   K 2.9*  --   CO2 26  --   GLUCOSE 150*  --   BUN 5*  --   CREATININE 0.79  --   CALCIUM 8.6*  --   MG  --  2.0    Cr  stable,    Lab Results  Component Value Date   CREATININE 0.79 01/22/2021   CREATININE 0.69 11/02/2019   CREATININE 0.84 05/09/2015    Recent Labs  Lab  01/22/21 1217  AST 50*  ALT 25  ALKPHOS 62  BILITOT 2.0*  PROT 6.4*  ALBUMIN 3.4*   Lab Results  Component Value Date   CALCIUM 8.6 (L) 01/22/2021   WBC      Component Value Date/Time   WBC 9.5 01/22/2021 1217   LYMPHSABS 0.9 05/09/2015 1324   MONOABS 0.5 05/09/2015 1324   EOSABS 0.0 05/09/2015 1324   BASOSABS 0.0 05/09/2015 1324   Plt: Lab Results  Component Value Date   PLT 240 01/22/2021     Procalcitonin   Ordered   COVID-19 Labs  No results for input(s): DDIMER, FERRITIN, LDH, CRP in the last 72 hours.  Lab Results  Component Value Date   SARSCOV2NAA POSITIVE (A) 01/22/2021     HG/HCT   Stable     Component Value Date/Time   HGB 11.8 (L) 01/22/2021 1217   HCT 33.4 (L) 01/22/2021 1217   MCV 94.4 01/22/2021 1217    Recent Labs  Lab 01/22/21 1217  LIPASE 42    ECG: Ordered Personally reviewed by me showing: HR : 115 Rhythm: , Sinus tachycardia     no evidence of ischemic changes QTC 482   UA   no evidence of UTI     Urine analysis:    Component Value Date/Time   COLORURINE AMBER (A) 01/22/2021 1217   APPEARANCEUR CLEAR 01/22/2021 1217   LABSPEC 1.020 01/22/2021 1217   PHURINE 6.0 01/22/2021 1217   GLUCOSEU NEGATIVE 01/22/2021 1217   HGBUR NEGATIVE 01/22/2021 1217   BILIRUBINUR NEGATIVE 01/22/2021 1217   KETONESUR 5 (A) 01/22/2021 1217   PROTEINUR NEGATIVE 01/22/2021 1217   NITRITE NEGATIVE 01/22/2021 1217   LEUKOCYTESUR NEGATIVE 01/22/2021 1217    Ordered    CXR -  NON acute     ED Triage Vitals  Enc Vitals Group     BP 01/22/21 1217 117/65     Pulse Rate 01/22/21 1217 (!) 114     Resp 01/22/21 1217 17     Temp 01/22/21 1217 98.1 F (36.7 C)     Temp Source 01/22/21 1217 Oral     SpO2 01/22/21 1217 98 %     Weight --      Height --      Head Circumference --      Peak Flow --      Pain Score 01/22/21 1216 6     Pain Loc --  Pain Edu? --      Excl. in GC? --   TMAX(24)@       Latest  Blood pressure 112/65, pulse  (!) 116, temperature 98.3 F (36.8 C), temperature source Oral, resp. rate (!) 22, SpO2 100 %.    Review of Systems:    Pertinent positives include:  chills, fatigue abdominal pain, nausea, vomiting, diarrhea,  Constitutional:  No weight loss, night sweats, Fevers, weight loss  HEENT:  No headaches, Difficulty swallowing,Tooth/dental problems,Sore throat,  No sneezing, itching, ear ache, nasal congestion, post nasal drip,  Cardio-vascular:  No chest pain, Orthopnea, PND, anasarca, dizziness, palpitations.no Bilateral lower extremity swelling  GI:  No heartburn, indigestion, change in bowel habits, loss of appetite, melena, blood in stool, hematemesis Resp:  no shortness of breath at rest. No dyspnea on exertion, No excess mucus, no productive cough, No non-productive cough, No coughing up of blood.No change in color of mucus.No wheezing. Skin:  no rash or lesions. No jaundice GU:  no dysuria, change in color of urine, no urgency or frequency. No straining to urinate.  No flank pain.  Musculoskeletal:  No joint pain or no joint swelling. No decreased range of motion. No back pain.  Psych:  No change in mood or affect. No depression or anxiety. No memory loss.  Neuro: no localizing neurological complaints, no tingling, no weakness, no double vision, no gait abnormality, no slurred speech, no confusion  All systems reviewed and apart from HOPI all are negative  Past Medical History:   Past Medical History:  Diagnosis Date  . Asthma   . Back pain       History reviewed. No pertinent surgical history.  Social History:  Ambulatory   independently       reports that he has been smoking cigarettes. He has never used smokeless tobacco. He reports current alcohol use of about 6.0 standard drinks of alcohol per week. He reports that he does not use drugs.   Family History:   Family History  Problem Relation Age of Onset  . Diabetes Neg Hx   . Hypertension Neg Hx      Allergies: No Known Allergies   Prior to Admission medications   Medication Sig Start Date End Date Taking? Authorizing Provider  ibuprofen (ADVIL) 200 MG tablet Take 600-800 mg by mouth every 6 (six) hours as needed for moderate pain.   Yes [provider]  dicyclomine (BENTYL) 20 MG tablet Take 1 tablet (20 mg total) by mouth 2 (two) times daily. Patient not taking: Reported on 11/02/2019 03/02/14 11/03/19  [name], PA-C   Physical Exam: Vitals with BMI 01/22/2021 01/22/2021 01/22/2021  Height - - -  Weight - - -  BMI - - -  Systolic 112 133 622  Diastolic 65 91 61  Pulse 116 118 115     1. General:  in No  Acute distress    acutely ill -appearing 2. Psychological: Alert and  Oriented 3. Head/ENT:    Dry Mucous Membranes                          Head Non traumatic, neck supple                            Poor Dentition 4. SKIN:  decreased Skin turgor,  Skin clean Dry and intact no rash 5. Heart: Regular rate and rhythm no  Murmur, no Rub  or gallop 6. Lungs , no wheezes or crackles   7. Abdomen: Soft,  non-tender, Non distended  obese  bowel sounds present 8. Lower extremities: no clubbing, cyanosis, no edema 9. Neurologically Grossly intact, moving all 4 extremities equally  tremolous 10. MSK: Normal range of motion   All other LABS:     Recent Labs  Lab 01/22/21 1217  WBC 9.5  HGB 11.8*  HCT 33.4*  MCV 94.4  PLT 240     Recent Labs  Lab 01/22/21 1217 01/22/21 1533  NA 130*  --   K 2.9*  --   CL 92*  --   CO2 26  --   GLUCOSE 150*  --   BUN 5*  --   CREATININE 0.79  --   CALCIUM 8.6*  --   MG  --  2.0     Recent Labs  Lab 01/22/21 1217  AST 50*  ALT 25  ALKPHOS 62  BILITOT 2.0*  PROT 6.4*  ALBUMIN 3.4*       Cultures:    Component Value Date/Time   SDES THROAT 12/17/2014 1704   SPECREQUEST NONE 12/17/2014 1704   CULT  12/17/2014 1704    No Beta Hemolytic Streptococci Isolated Performed at [facility]     REPTSTATUS 12/19/2014 FINAL 12/17/2014 1704     Radiological Exams on Admission: DG Chest Portable 1 View  Result Date: 01/22/2021 CLINICAL DATA:  Cough EXAM: PORTABLE CHEST 1 VIEW COMPARISON:  11/02/2019 FINDINGS: The heart size and mediastinal contours are within normal limits. Both lungs are clear. Subacute to chronic appearing bilateral lower rib fractures. IMPRESSION: No active disease. Electronically Signed   By: Jasmine Pang M.D.   On: 01/22/2021 15:45    Chart has been reviewed    Assessment/Plan  35 y.o. male with medical history significant of etoh abuse, asthma    Admitted for lower gi bleed  Present on Admission: . Lower GI bleed - - Suspect Lower Gi source  No hx of PUD,  melena,  BUN elevation to  suggest otherwise  - Admit  For further management given:  comorbid illnesses  hemodynamic instability,   - painless bleeding making colitis less likely     -  Epic msg sent to  (  [facility]) GI appreciate their consult   - serial CBC.    - Monitor for any recurrence,  evidence of hemodynamic instability or significant blood loss -  type and screen,  - Transfuse as needed for hemoglobin below 7 or <9 if evidence of significant  bleeding  - Establish at least 2 PIV and fluid resuscitate   - clear liquids for tonight keep nothing by mouth post midnight,  -  monitor for Recurrent significant  Bleeding of red blood and hemodynamic instability in which case Bleeding scan and IR consult would be indicated.   . Alcohol withdrawal syndrome with complication, with unspecified complication (HCC) - spoke about importance of alcohol cessation CIWA protocol Admit to progressive level  . COVID-19 virus infection -patient endorsing cough and shortness of breath with worsening asthma exacerbation. Given risk factors such as asthma start on remdesivir Added steroids given significant wheezing Obtain inflammatory markers At this point no evidence of hypoxia  . Acute asthma exacerbation  -with albuterol and steroids.  Most likely asthma exacerbation in the setting of Covid infection  . Hypokalemia -- will replace and repeat in AM,  check magnesium level and replace as needed  . Tobacco abuse -  -  Spoke about importance of quitting spent 5 minutes discussing options for treatment, prior attempts at quitting, and dangers of smoking  -At this point patient is   interested in quitting  - order nicotine patch   - nursing tobacco cessation protocol  . Hyponatremia -likely in the setting of dehydration and alcohol abuse. Obtain electrolytes gently rehydrate avoid over aggressive fluid rehydration given Covid Follow sodium  . Dehydration -gently rehydrate and follow   Other plan as per orders.  DVT prophylaxis:  SCD     Code Status:    Code Status: Not on file FULL CODE   as per patient   I had personally discussed CODE STATUS with patient      Family Communication:   Family not at  Bedside   Disposition Plan:      To home once workup is complete and patient is stable   Following barriers for discharge:                            Electrolytes corrected Alcohol withdraw improved                               Anemia stable                             Pain controlled with PO medications                                                           Will need to be able to tolerate PO                                                       Will need consultants to evaluate patient prior to discharge                                    Transition of care consulted                                      Consults called: emailed GI    Admission status:  ED Disposition    ED Disposition Condition Comment   Admit  Hospital Area: [facility] [[id number]]  Level of Care: Progressive [102]  Admit to Progressive based on following criteria: NEUROLOGICAL AND NEUROSURGICAL complex patients with significant risk of instability, who do not meet ICU criteria, yet require  close observation or frequent assessment (< / = every 2 - 4 hours) with medical / nursing intervention.  Covid Evaluation: Confirmed COVID Positive  Diagnosis: Lower GI bleed [[id number]]  Admitting Physician: Therisa Doyne [[id number]]  Attending Physician: Therisa Doyne [[id number]]       Obs    Level of care  Progressive  tele indefinitely please discontinue once patient no longer qualifies COVID-19 Labs    Lab Results  Component Value Date  SARSCOV2NAA POSITIVE (A) 01/22/2021     Precautions: admitted as  covid positive Airborne and Contact precautions    PPE: Used by the provider:   P100  eye Goggles,  Gloves  gown      Anastassia Doutova 01/22/2021, 8:40 PM    [facility]     after 2 AM please page floor coverage PA If 7AM-7PM, please contact the day team taking care of the patient using [email or web address]   Patient was evaluated in the context of the global COVID-19 pandemic, which necessitated consideration that the patient might be at risk for infection with the SARS-CoV-2 virus that causes COVID-19. Institutional protocols and algorithms that pertain to the evaluation of patients at risk for COVID-19 are in a state of rapid change based on information released by regulatory bodies including the CDC and federal and state organizations. These policies and algorithms were followed during the patient's care.

## 2021-01-22 NOTE — ED Provider Notes (Addendum)
[facility] EMERGENCY DEPARTMENT Provider Note   CSN: [id number] Arrival date & time: 01/22/21  1154     History Chief Complaint  Patient presents with  . GI Bleeding    N/v     JEROME VIGLIONE is a 35 y.o. male with h/o asthma, alcohol abuse, and chronic back pain who presents to the ED for GI bleed. Ongoing cough for the last week. 3 days ago, began having N/V/D. Multiple NBNB emesis episodes daily. BRBPR over the last several days as well mixed with stool and fills the bowl. No anticoagulation. No previous colonoscopy or EGS. No known liver disease. Patient also reporting BLE leg pain and swelling for the last several days as well. Unable to tolerate PO. Denies fever, chills, chest pain, SOB, or abdominal pain. Usually drinks 12-pack of beer daily. Reduced intake over the last several days due to illness. Previous EtOH withdrawal without DTs.  The history is provided by the patient and medical records.  GI Problem This is a new problem. The current episode started more than 2 days ago. The problem occurs daily. The problem has not changed since onset.Pertinent negatives include no chest pain, no abdominal pain, no headaches and no shortness of breath. The symptoms are aggravated by eating. Nothing relieves the symptoms. He has tried nothing for the symptoms.       Past Medical History:  Diagnosis Date  . Asthma   . Back pain     There are no problems to display for this patient.   History reviewed. No pertinent surgical history.     No family history on file.  Social History   Tobacco Use  . Smoking status: Current Some Day Smoker    Types: Cigarettes  . Smokeless tobacco: Never Used  Substance Use Topics  . Alcohol use: Yes    Alcohol/week: 6.0 standard drinks    Types: 6 Cans of beer per week  . Drug use: No    Home Medications Prior to Admission medications   Medication Sig Start Date End Date Taking? Authorizing Provider  ibuprofen (ADVIL)  200 MG tablet Take 600-800 mg by mouth every 6 (six) hours as needed for moderate pain.   Yes [provider]  dicyclomine (BENTYL) 20 MG tablet Take 1 tablet (20 mg total) by mouth 2 (two) times daily. Patient not taking: Reported on 11/02/2019 03/02/14 11/03/19  [name], PA-C    Allergies    Patient has no known allergies.  Review of Systems   Review of Systems  Constitutional: Positive for appetite change and fatigue. Negative for chills and fever.  HENT: Negative for ear pain and sore throat.   Eyes: Negative for pain and visual disturbance.  Respiratory: Positive for cough. Negative for shortness of breath.   Cardiovascular: Positive for leg swelling. Negative for chest pain and palpitations.  Gastrointestinal: Positive for abdominal distention, blood in stool, diarrhea, nausea and vomiting. Negative for abdominal pain.  Genitourinary: Negative for dysuria and hematuria.  Musculoskeletal: Negative for arthralgias and back pain.  Skin: Negative for color change and rash.  Neurological: Positive for tremors. Negative for seizures, syncope and headaches.  All other systems reviewed and are negative.   Physical Exam Updated Vital Signs BP (!) 105/93   Pulse (!) 115   Temp 98.3 F (36.8 C) (Oral)   Resp 20   SpO2 99%   Physical Exam Vitals and nursing note reviewed. Exam conducted with a chaperone present.  Constitutional:  General: He is awake. He is not in acute distress.    Appearance: He is well-developed and well-nourished. He is not ill-appearing or diaphoretic.  HENT:     Head: Normocephalic and atraumatic.     Right Ear: External ear normal.     Left Ear: External ear normal.     Nose: Nose normal.  Eyes:     General: No scleral icterus.       Right eye: No discharge.        Left eye: No discharge.     Conjunctiva/sclera: Conjunctivae normal.  Cardiovascular:     Rate and Rhythm: Regular rhythm. Tachycardia present.     Pulses: Normal pulses.      Heart sounds: Normal heart sounds. No murmur heard.   Pulmonary:     Effort: Pulmonary effort is normal. No respiratory distress.     Breath sounds: Normal breath sounds. No wheezing, rhonchi or rales.  Abdominal:     General: There is distension.     Palpations: Abdomen is soft.     Tenderness: There is no abdominal tenderness. There is no guarding or rebound. Negative signs include Murphy's sign, Rovsing's sign and McBurney's sign.  Genitourinary:    Rectum: Normal. No external hemorrhoid or internal hemorrhoid.     Comments: No gross blood or melena on DRE. Musculoskeletal:     Cervical back: Neck supple.     Right lower leg: 1+ Pitting Edema present.     Left lower leg: 1+ Pitting Edema present.  Skin:    General: Skin is warm and dry.     Findings: No rash.  Neurological:     General: No focal deficit present.     Mental Status: He is alert and oriented to person, place, and time.     Sensory: No sensory deficit.     Motor: No weakness.     Comments: Mild tremor noted to BUE  Psychiatric:        Mood and Affect: Mood and affect and mood normal.        Behavior: Behavior normal. Behavior is cooperative.     ED Results / Procedures / Treatments   Labs (all labs ordered are listed, but only abnormal results are displayed) Labs Reviewed  RESP PANEL BY RT-PCR (FLU A&B, COVID) ARPGX2 - Abnormal; Notable for the following components:      Result Value   SARS Coronavirus 2 by RT PCR POSITIVE (*)    All other components within normal limits  COMPREHENSIVE METABOLIC PANEL - Abnormal; Notable for the following components:   Sodium 130 (*)    Potassium 2.9 (*)    Chloride 92 (*)    Glucose, Bld 150 (*)    BUN 5 (*)    Calcium 8.6 (*)    Total Protein 6.4 (*)    Albumin 3.4 (*)    AST 50 (*)    Total Bilirubin 2.0 (*)    All other components within normal limits  CBC - Abnormal; Notable for the following components:   RBC 3.54 (*)    Hemoglobin 11.8 (*)    HCT 33.4  (*)    RDW 17.5 (*)    All other components within normal limits  URINALYSIS, ROUTINE W REFLEX MICROSCOPIC - Abnormal; Notable for the following components:   Color, Urine AMBER (*)    Ketones, ur 5 (*)    All other components within normal limits  LIPASE, BLOOD  MAGNESIUM  PROTIME-INR  HIV ANTIBODY (ROUTINE TESTING  W REFLEX)  C-REACTIVE PROTEIN  D-DIMER, QUANTITATIVE  FERRITIN  FIBRINOGEN  LACTATE DEHYDROGENASE  PROCALCITONIN  PHOSPHORUS  CK  TYPE AND SCREEN  ABO/RH    EKG EKG Interpretation  Date/Time:  Monday January 22 2021 15:43:19 EST Ventricular Rate:  115 PR Interval:    QRS Duration: 90 QT Interval:  348 QTC Calculation: 482 R Axis:   64 Text Interpretation: Sinus tachycardia Borderline prolonged QT interval Confirmed by Margarita Grizzleay, Danielle [phone]([id number]) on 01/22/2021 4:43:55 PM   Radiology DG Chest Portable 1 View  Result Date: 01/22/2021 CLINICAL DATA:  Cough EXAM: PORTABLE CHEST 1 VIEW COMPARISON:  11/02/2019 FINDINGS: The heart size and mediastinal contours are within normal limits. Both lungs are clear. Subacute to chronic appearing bilateral lower rib fractures. IMPRESSION: No active disease. Electronically Signed   By: Jasmine PangKim  Fujinaga M.D.   On: 01/22/2021 15:45    Procedures Procedures  Medications Ordered in ED Medications  benzonatate (TESSALON) capsule 100 mg (has no administration in time range)  LORazepam (ATIVAN) injection 0-4 mg (0 mg Intravenous Not Given 01/22/21 1923)    Or  LORazepam (ATIVAN) tablet 0-4 mg ( Oral See Alternative 01/22/21 1923)  LORazepam (ATIVAN) injection 0-4 mg (has no administration in time range)    Or  LORazepam (ATIVAN) tablet 0-4 mg (has no administration in time range)  thiamine tablet 100 mg (has no administration in time range)    Or  thiamine (B-1) injection 100 mg (has no administration in time range)  lactated ringers bolus 1,000 mL (0 mLs Intravenous Stopped 01/22/21 1653)  ondansetron (ZOFRAN) injection 4 mg (4 mg  Intravenous Given 01/22/21 1535)  LORazepam (ATIVAN) injection 1 mg (1 mg Intravenous Given 01/22/21 1537)  potassium chloride 10 mEq in 100 mL IVPB (0 mEq Intravenous Stopped 01/22/21 1640)  sodium chloride 0.9 % bolus 1,000 mL (1,000 mLs Intravenous New Bag/Given 01/22/21 1904)    ED Course  I have reviewed the triage vital signs and the nursing notes.  Pertinent labs & imaging results that were available during my care of the patient were reviewed by me and considered in my medical decision making (see chart for details).    MDM Rules/Calculators/A&P                          Patient is a 34yoM with history and physical as described above who presents to the ED for GI bleed, N/V/D, and cough. Patient tachycardic on exam to 110s, otherwise HDS. He appears dehydrated and suffering from EtOH withdrawal symptoms. He is awake, alert, and mentating appropriately. Initial workup started in triage and notable for K 2.9, Cl 92, Na 130, T bili 2, Hgb 11.8 (13.7 on 12/31/20). Additional workup includes COVID, CXR, ECG, magnesium, type and screen, and coags. Initial treatment includes IVF bolus, Ativan, Zofran, and K repletion.  COVID positive. Satting well on RA with no respiratory distress. Given electrolyte derangements, COVID positive, and hematochezia noted in stool with additional EtOH withdrawal symptoms, patient warrants admission for further observation and management. Discussed patient with medicine who will admit. CIWA protocol initiated in ED. Patient otherwise remained HDS with no acute events during ED course.  Final Clinical Impression(s) / ED Diagnoses Final diagnoses:  Hematochezia  Nausea vomiting and diarrhea  Alcohol withdrawal syndrome without complication (HCC)  COVID-19  Hypokalemia    Rx / DC Orders ED Discharge Orders    None         Tonia BroomsKeith, Anastasha Ortez, MD 01/22/21 1930  Margarita Grizzle, MD 01/22/21 [phone]

## 2021-01-22 NOTE — ED Notes (Signed)
EDP made aware of pt's request for pain medication

## 2021-01-23 ENCOUNTER — Other Ambulatory Visit: Payer: Self-pay

## 2021-01-23 ENCOUNTER — Emergency Department (HOSPITAL_COMMUNITY): Payer: Self-pay | Attending: Internal Medicine

## 2021-01-23 ENCOUNTER — Encounter (HOSPITAL_COMMUNITY): Payer: Self-pay | Admitting: Internal Medicine

## 2021-01-23 DIAGNOSIS — K921 Melena: Secondary | ICD-10-CM

## 2021-01-23 DIAGNOSIS — E871 Hypo-osmolality and hyponatremia: Secondary | ICD-10-CM

## 2021-01-23 DIAGNOSIS — U071 COVID-19: Principal | ICD-10-CM

## 2021-01-23 DIAGNOSIS — R069 Unspecified abnormalities of breathing: Secondary | ICD-10-CM

## 2021-01-23 DIAGNOSIS — F1010 Alcohol abuse, uncomplicated: Secondary | ICD-10-CM

## 2021-01-23 DIAGNOSIS — R609 Edema, unspecified: Secondary | ICD-10-CM | POA: Insufficient documentation

## 2021-01-23 DIAGNOSIS — D62 Acute posthemorrhagic anemia: Secondary | ICD-10-CM

## 2021-01-23 DIAGNOSIS — E876 Hypokalemia: Secondary | ICD-10-CM

## 2021-01-23 LAB — COMPREHENSIVE METABOLIC PANEL
ALT: 24 U/L (ref 0–44)
AST: 40 U/L (ref 15–41)
Albumin: 3.1 g/dL — ABNORMAL LOW (ref 3.5–5.0)
Alkaline Phosphatase: 59 U/L (ref 38–126)
Anion gap: 8 (ref 5–15)
BUN: 8 mg/dL (ref 6–20)
CO2: 25 mmol/L (ref 22–32)
Calcium: 8.2 mg/dL — ABNORMAL LOW (ref 8.9–10.3)
Chloride: 94 mmol/L — ABNORMAL LOW (ref 98–111)
Creatinine, Ser: 0.9 mg/dL (ref 0.61–1.24)
GFR, Estimated: >60 mL/min (ref >60)
Glucose, Bld: 199 mg/dL — ABNORMAL HIGH (ref 70–99)
Potassium: 3.4 mmol/L — ABNORMAL LOW (ref 3.5–5.1)
Sodium: 127 mmol/L — ABNORMAL LOW (ref 135–145)
Total Bilirubin: 1.4 mg/dL — ABNORMAL HIGH (ref 0.3–1.2)
Total Protein: 6.1 g/dL — ABNORMAL LOW (ref 6.5–8.1)

## 2021-01-23 LAB — CBC
HCT: 30.9 % — ABNORMAL LOW (ref 39.0–52.0)
HCT: 32 % — ABNORMAL LOW (ref 39.0–52.0)
Hemoglobin: 10.6 g/dL — ABNORMAL LOW (ref 13.0–17.0)
Hemoglobin: 10.9 g/dL — ABNORMAL LOW (ref 13.0–17.0)
MCH: 32.8 pg (ref 26.0–34.0)
MCH: 33.1 pg (ref 26.0–34.0)
MCHC: 34.3 g/dL (ref 30.0–36.0)
MCHC: 34.1 g/dL (ref 30.0–36.0)
MCV: 95.7 fL (ref 80.0–100.0)
MCV: 97.3 fL (ref 80.0–100.0)
Platelets: 163 10*3/uL (ref 150–400)
Platelets: 182 K/uL (ref 150–400)
RBC: 3.23 MIL/uL — ABNORMAL LOW (ref 4.22–5.81)
RBC: 3.29 MIL/uL — ABNORMAL LOW (ref 4.22–5.81)
RDW: 17.2 % — ABNORMAL HIGH (ref 11.5–15.5)
RDW: 17.3 % — ABNORMAL HIGH (ref 11.5–15.5)
WBC: 5.4 10*3/uL (ref 4.0–10.5)
WBC: 6.1 K/uL (ref 4.0–10.5)
nRBC: 0 % (ref 0.0–0.2)
nRBC: 0 % (ref 0.0–0.2)

## 2021-01-23 LAB — D-DIMER, QUANTITATIVE: D-Dimer, Quant: <0.27 ug/mL-FEU (ref 0.00–0.50)

## 2021-01-23 LAB — CBC WITH DIFFERENTIAL/PLATELET
Abs Immature Granulocytes: 0.03 10*3/uL (ref 0.00–0.07)
Basophils Absolute: 0 10*3/uL (ref 0.0–0.1)
Basophils Relative: 0 %
Eosinophils Absolute: 0 10*3/uL (ref 0.0–0.5)
Eosinophils Relative: 0 %
HCT: 30.7 % — ABNORMAL LOW (ref 39.0–52.0)
Hemoglobin: 10.9 g/dL — ABNORMAL LOW (ref 13.0–17.0)
Immature Granulocytes: 1 %
Lymphocytes Relative: 10 %
Lymphs Abs: 0.6 10*3/uL — ABNORMAL LOW (ref 0.7–4.0)
MCH: 33.4 pg (ref 26.0–34.0)
MCHC: 35.5 g/dL (ref 30.0–36.0)
MCV: 94.2 fL (ref 80.0–100.0)
Monocytes Absolute: 0.1 10*3/uL (ref 0.1–1.0)
Monocytes Relative: 2 %
Neutro Abs: 5 10*3/uL (ref 1.7–7.7)
Neutrophils Relative %: 87 %
Platelets: 165 10*3/uL (ref 150–400)
RBC: 3.26 MIL/uL — ABNORMAL LOW (ref 4.22–5.81)
RDW: 17.3 % — ABNORMAL HIGH (ref 11.5–15.5)
WBC: 5.8 10*3/uL (ref 4.0–10.5)
nRBC: 0 % (ref 0.0–0.2)

## 2021-01-23 LAB — PROCALCITONIN: Procalcitonin: 0.27 ng/mL

## 2021-01-23 LAB — PHOSPHORUS: Phosphorus: 4.4 mg/dL (ref 2.5–4.6)

## 2021-01-23 LAB — VITAMIN B12: Vitamin B-12: 234 pg/mL (ref 180–914)

## 2021-01-23 LAB — C-REACTIVE PROTEIN: CRP: 0.6 mg/dL (ref <1.0)

## 2021-01-23 LAB — TSH: TSH: 0.408 u[IU]/mL (ref 0.350–4.500)

## 2021-01-23 LAB — FERRITIN: Ferritin: 231 ng/mL (ref 24–336)

## 2021-01-23 LAB — HEPATITIS A ANTIBODY, TOTAL: hep A Total Ab: NONREACTIVE

## 2021-01-23 LAB — URIC ACID: Uric Acid, Serum: 6.8 mg/dL (ref 3.7–8.6)

## 2021-01-23 LAB — MAGNESIUM: Magnesium: 2 mg/dL (ref 1.7–2.4)

## 2021-01-23 MED ORDER — OXYCODONE HCL 5 MG PO TABS
10.0000 mg | ORAL_TABLET | ORAL | Status: DC | PRN
  Administered 2021-01-23 – 2021-01-26 (×17): 10 mg via ORAL
  Filled 2021-01-23 (×17): qty 2

## 2021-01-23 MED ORDER — POTASSIUM CHLORIDE CRYS ER 20 MEQ PO TBCR
40.0000 meq | EXTENDED_RELEASE_TABLET | Freq: Four times a day (QID) | ORAL | Status: DC
  Filled 2021-01-23: qty 2

## 2021-01-23 MED ORDER — METHYLPREDNISOLONE SODIUM SUCC 40 MG IJ SOLR
40.0000 mg | Freq: Two times a day (BID) | INTRAMUSCULAR | Status: DC
  Administered 2021-01-23 – 2021-01-24 (×2): 40 mg via INTRAVENOUS
  Filled 2021-01-23 (×2): qty 1

## 2021-01-23 MED ORDER — POTASSIUM CHLORIDE 10 MEQ/100ML IV SOLN
10.0000 meq | INTRAVENOUS | Status: AC
  Administered 2021-01-23 (×2): 10 meq via INTRAVENOUS
  Filled 2021-01-23 (×2): qty 100

## 2021-01-23 MED ORDER — PANTOPRAZOLE SODIUM 40 MG PO TBEC
40.0000 mg | DELAYED_RELEASE_TABLET | Freq: Every day | ORAL | Status: DC
  Administered 2021-01-24 – 2021-01-26 (×3): 40 mg via ORAL
  Filled 2021-01-23 (×3): qty 1

## 2021-01-23 NOTE — Consult Note (Addendum)
[facility] Consult: 10:31 AM 01/23/2021  LOS: 0 days    Referring Provider: Dr Nena Alexander  Primary Care Physician:  Patient, No Pcp Per Primary Gastroenterologist:  None, unassigned     Reason for Consultation:  Bloody diarrhea   HPI: Shane Holmes is a 35 y.o. male.  PMH of asthma and back pain.  Alcoholism. 01/2018 scrotal ultrasound showed left-sided varicocele. 11/2019 CTAP with contrast and abdominal ultrasound revealed severe diffuse fatty liver.  At that time LFTs were elevated with T bili 2.  Alk phos 78, AST/ALT 114/48.    Long-term issues with alcohol abuse, alcoholism.   Has had some periods of sobriety either because he was incarcerated or was able to stop drinking on his own.  His longest period of sobriety lasted 18 months and that occurred about 3 years ago.  He is experienced DTs while detoxing in jail.  Drinks a 12 pack of beer daily, occasionally but not often might add some shots of hard liquor.   Presented to the ED yesterday afternoon/21.  3 days of nausea, vomiting, blood in his stools.  At least half of his bowel movements are mixed with blood in the stools are soft, loose.  Normally has formed to soft stools, brown in color 3 or 4 times a day.  Has never seen blood before the past few days.  +Body aches, sore throat, dry mouth, upper respiratory symptoms, altered taste sensation.  Fatigued.  No hematemesis.  Shooting pains but numbness as well in his feet.  Using moderate amounts of Advil recently.  2 feeling unwell he cut back on beer intake, last intake was  2 beers on 2/20. Tested positive for COVID-19, had never been vaccinated.  WBCs normal.  Hgb 10.6.  MCV 95.  Platelets normal.  INR 1.1. T bili 2.0, alk phos 62.  AST/ALT 50/25.  Lipase 42. Sodium 127.  Potassium 2.8.  Glucose  199. No elevation of CRP, procalcitonin, ferritin. Portable CXR: No active disease.  With parents were alcoholics, developed cirrhosis and died in their 76s due to their liver disease.  Works as a Development worker, international aid.  Shares a home with 2 friends.  One of them does not drink and the other one drinks occasionally.    Past Medical History:  Diagnosis Date  . Asthma   . Back pain     History reviewed. No pertinent surgical history.  Prior to Admission medications   Medication Sig Start Date End Date Taking? Authorizing Provider  ibuprofen (ADVIL) 200 MG tablet Take 600-800 mg by mouth every 6 (six) hours as needed for moderate pain.   Yes [provider]  dicyclomine (BENTYL) 20 MG tablet Take 1 tablet (20 mg total) by mouth 2 (two) times daily. Patient not taking: Reported on 11/02/2019 03/02/14 11/03/19  Margarita Mail, PA-C    Scheduled Meds: . albuterol  2 puff Inhalation Q6H  . vitamin C  500 mg Oral Daily  . LORazepam  0-4 mg Intravenous Q6H   Or  . LORazepam  0-4 mg Oral Q6H  . [  START ON 01/25/2021] LORazepam  0-4 mg Intravenous Q12H   Or  . [START ON 01/25/2021] LORazepam  0-4 mg Oral Q12H  . methylPREDNISolone (SOLU-MEDROL) injection  60 mg Intravenous Q12H  . multivitamin with minerals  1 tablet Oral Daily  . nicotine  21 mg Transdermal Daily  . pantoprazole (PROTONIX) IV  40 mg Intravenous Q12H  . potassium chloride  40 mEq Oral Q6H  . sodium chloride flush  3 mL Intravenous Q12H  . thiamine  100 mg Oral Daily   Or  . thiamine  100 mg Intravenous Daily  . zinc sulfate  220 mg Oral Daily   Infusions: . remdesivir 100 mg in NS 100 mL 100 mg (01/23/21 0940)   PRN Meds: acetaminophen, albuterol, chlorpheniramine-HYDROcodone, HYDROcodone-acetaminophen, ondansetron **OR** ondansetron (ZOFRAN) IV   Allergies as of 01/22/2021  . (No Known Allergies)    Family History  Problem Relation Age of Onset  . Diabetes Neg Hx   . Hypertension Neg Hx     Social History    Socioeconomic History  . Marital status: Legally Separated    Spouse name: Not on file  . Number of children: Not on file  . Years of education: Not on file  . Highest education level: Not on file  Occupational History  . Not on file  Tobacco Use  . Smoking status: Current Some Day Smoker    Types: Cigarettes  . Smokeless tobacco: Never Used  Substance and Sexual Activity  . Alcohol use: Yes    Alcohol/week: 6.0 standard drinks    Types: 6 Cans of beer per week  . Drug use: No  . Sexual activity: Not on file  Other Topics Concern  . Not on file  Social History Narrative  . Not on file   Social Determinants of Health   Financial Resource Strain: Not on file  Food Insecurity: Not on file  Transportation Needs: Not on file  Physical Activity: Not on file  Stress: Not on file  Social Connections: Not on file  Intimate Partner Violence: Not on file    REVIEW OF SYSTEMS: Constitutional: Fatigue.  Place. ENT:  No nose bleeds.  Sore throat. Pulm: No shortness of breath.  Positive cough CV:  No palpitations, no angina.  No generalized edema GU:  No hematuria, no frequency GI: See HPI. Heme: [place] unusual or excessive bleeding or bruising. Transfusions: None. Neuro:  No headaches, no peripheral tingling or numbness no syncope, no seizures. Derm:  No itching, no rash or sores.  Musculoskeletal: Painful swelling in his ankles and feet, sensation of numbness in his feet as well. Endocrine:  No sweats or chills.  No polyuria or dysuria Immunization: Was never vaccinated for COVID-19.  Did not ask him about other routine immunizations. Travel:  None beyond local counties in last few months.    PHYSICAL EXAM: Vital signs in last 24 hours: Vitals:   01/23/21 0306 01/23/21 0400  BP: 113/77 106/66  Pulse: 97 89  Resp: 18 14  Temp: 98 F (36.7 C) 98.2 F (36.8 C)  SpO2: 96% 94%   Wt Readings from Last 3 Encounters:  09/20/20 87.1 kg  11/02/19 90.7 kg  02/02/19 86.2 kg     General: Patient looks moderately ill but alert, comfortable. Head: No facial asymmetry or swelling. + Facial rubor Eyes: No icterus, no conjunctival pallor Ears: No hearing deficit Nose: No discharge Mouth: Oropharynx moist, pink, clear.  Tongue midline. Neck: No JVD, no masses, no thyromegaly Lungs: CTA bilateral  Heart: RRR.  No MRG.  S1, S2 present Abdomen: Soft, nontender, slightly protuberant.  Soft.  No HSM, masses, bruits, hernias.   Rectal: Deferred Musc/Skeltl: No joint redness, swelling or gross deformity. Extremities: No obvious pedal or ankle edema nor erythema. Neurologic: Oriented x3.  Alert.  Good historian.  Moves all 4 limbs.  No tremors, no asterixis. Skin: No rashes or sores. Nodes: No cervical adenopathy Psych: Cooperative, calm, pleasant.  Intake/Output from previous day: 02/21 0701 - 02/22 0700 In: 1082.6 [P.O.:600; I.V.:482.6] Out: 400 [Urine:400] Intake/Output this shift: Total I/O In: -  Out: 200 [Urine:200]  LAB RESULTS: Recent Labs    01/22/21 2221 01/23/21 0136 01/23/21 0828  WBC 7.3 5.8 5.4  HGB 10.3* 10.9* 10.6*  HCT 28.5* 30.7* 30.9*  PLT 185 165 163   BMET Lab Results  Component Value Date   NA 127 (L) 01/23/2021   NA 128 (L) 01/22/2021   NA 130 (L) 01/22/2021   K 3.4 (L) 01/23/2021   K 2.8 (L) 01/22/2021   K 2.9 (L) 01/22/2021   CL 94 (L) 01/23/2021   CL 92 (L) 01/22/2021   CL 92 (L) 01/22/2021   CO2 25 01/23/2021   CO2 25 01/22/2021   CO2 26 01/22/2021   GLUCOSE 199 (H) 01/23/2021   GLUCOSE 111 (H) 01/22/2021   GLUCOSE 150 (H) 01/22/2021   BUN 8 01/23/2021   BUN 8 01/22/2021   BUN 5 (L) 01/22/2021   CREATININE 0.90 01/23/2021   CREATININE 0.94 01/22/2021   CREATININE 0.79 01/22/2021   CALCIUM 8.2 (L) 01/23/2021   CALCIUM 8.3 (L) 01/22/2021   CALCIUM 8.6 (L) 01/22/2021   LFT Recent Labs    01/22/21 1217 01/23/21 0136  PROT 6.4* 6.1*  ALBUMIN 3.4* 3.1*  AST 50* 40  ALT 25 24  ALKPHOS 62 59  BILITOT 2.0*  1.4*   PT/INR Lab Results  Component Value Date   INR 1.1 01/22/2021   Hepatitis Panel No results for input(s): HEPBSAG, HCVAB, HEPAIGM, HEPBIGM in the last 72 hours. C-Diff No components found for: CDIFF Lipase     Component Value Date/Time   LIPASE 42 01/22/2021 1217    Drugs of Abuse  No results found for: LABOPIA, COCAINSCRNUR, LABBENZ, AMPHETMU, THCU, LABBARB   RADIOLOGY STUDIES: DG Chest Portable 1 View  Result Date: 01/22/2021 CLINICAL DATA:  Cough EXAM: PORTABLE CHEST 1 VIEW COMPARISON:  11/02/2019 FINDINGS: The heart size and mediastinal contours are within normal limits. Both lungs are clear. Subacute to chronic appearing bilateral lower rib fractures. IMPRESSION: No active disease. Electronically Signed   By: Donavan Foil M.D.   On: 01/22/2021 15:45      IMPRESSION:   *   Acute bloody stools in pt who is COVID-19 positive. R/o Covid 19 gastroenteritis, colitis.  Symptoms are so acute and there is no prodrome of GI issues so low likelihood of IBD, malignancy.  *   Covid 19 + w/o PNA.  Remdesivir, Solu-Medrol in place  *   Fatty liver in alcoholic, dates back 2 plus years.  Cannot rule out cirrhosis. Previous testing for hepatitis performed within the jail/prison system and we do not have those results, patient says they were negative. No thrombocytopenia or coagulopathy.  *    Alcoholism.  CIWA protocol meds in place.  *    Hyponatremia  *    Hypokalemia.  *    Hyperglycemia.  Periodic elevation of glucoses dating back to 2015.    PLAN:     *  Ordered labs include hepatitis A antibody, hepatitis B surface antigen, hepatitis B surface antibody, HCV, mitochondrial/smooth muscle antibody, ceruloplasmin, IgG, ANA.  Follow-up chemistries, CBC tomorrow.  *    Advance to carb modified diet.  Tolerated some [place] food later yesterday.  *   CTAP w contrast?, will d/w Dr Bryan Lemma.    *   Switch to once daily Protonix by mouth.   Azucena Freed   01/23/2021, 10:31 AM Phone [phone]

## 2021-01-23 NOTE — Progress Notes (Signed)
PROGRESS NOTE                                                                                                                                                                                                             Patient Demographics:    Shane Holmes, is a 35 y.o. male, DOB - [birth year], ZOX:[id number]  Outpatient Primary MD for the patient is Patient, No Pcp Per   Admit date - 01/22/2021   LOS - 0  Chief Complaint  Patient presents with   GI Bleeding    N/v        Brief Narrative: Patient is a 35 y.o. male with PMHx of alcohol use, asthma-presenting to the hospital with several days history of cough/wheezing, bilateral lower extremity pain and hematochezia.   COVID-19 vaccinated status: Unvaccinated   Significant Events: 2/21>> [facility] for lower GI bleed/asthma exacerbation/Covid infection  Significant studies: 2/21>>Chest x-ray: No pneumonia  COVID-19 medications: Remdesivir: 2/21>>  Antibiotics: None  Microbiology data: None  Procedures: None  Consults: None  DVT prophylaxis: SCDs Start: 01/22/21 2053    Subjective:    Shane Holmes today continues to have hematochezia-shortness of breath/cough is better.  Complains of pain/weakness in his lower thigh/extremities.   Assessment  & Plan :   Hematochezia: Unclear etiology-GI following-follow CBC and transfuse accordingly.  Acute blood loss anemia: Secondary to above-follow.  EtOH abuse: Has prior history of DTs-somewhat anxious but no major signs of DTs as of now.  Continue Ativan per CIWA protocol.  Counseled-claims he is quitting after discharge from the hospital.  Asthma exacerbation: Moving air well-some wheezing-continue steroids and bronchodilators.  COVID-19 infection: No hypoxia-no pneumonia on imaging-Remdesivir x3 days.  Fever: afebrile O2 requirements:  SpO2: 94 %   COVID-19 Labs: Recent Labs    01/22/21 2221  01/23/21 0136  DDIMER <0.27 <0.27  FERRITIN 229 231  LDH 185  --   CRP 0.6 0.6    No results found for: BNP  Recent Labs  Lab 01/22/21 2221  PROCALCITON 0.27    Lab Results  Component Value Date   SARSCOV2NAA POSITIVE (A) 01/22/2021    Hypokalemia: Secondary to alcohol abuse-GI loss-continue replete-recheck in a.m.  Follow magnesium levels closely as well.  Hyponatremia: Volume status appears stable-could be from a combination of beer potomania and IV fluid that the patient received  on admission.  Saline lock all IV fluids-repeat electrolytes tomorrow-if hyponatremia persists-we can contemplate further work-up.  Bilateral foot pain/bilateral thigh pain: Claims when he stood up yesterday he fell due to pain-claims his thighs feel tight due to swelling.  CK levels normal on admission-check TSH, vitamin B12, uric acid levels.  He is able to move both of his lower extremities-able to lift them off the bed against mild resistance-denies any back pain-ambulate with physical therapy-and see how he does. ? Could be developing myopathies on alcohol use.  Tobacco abuse: Counseled-interested in quitting-on transdermal nicotine.  GI prophylaxis: PPI  ABG: No results found for: PHART, PCO2ART, PO2ART, HCO3, TCO2, ACIDBASEDEF, O2SAT  Vent Settings: N/A    Condition - Stable  Family Communication  : None at bedside-we will update family over the next day or so.  Code Status :  Full Code  Diet :  Diet Order            Diet Carb Modified Fluid consistency: Thin; Room service appropriate? Yes  Diet effective now                  Disposition Plan  :   Status is: Observation  The patient will require care spanning > 2 midnights and should be moved to inpatient because: Inpatient level of care appropriate due to severity of illness  Dispo: The patient is from: Home              Anticipated d/c is to: Home              Anticipated d/c date is: 2 days              Patient  currently is not medically stable to d/c.   Difficult to place patient No  Barriers to discharge: Ongoing GI bleeding--alcohol withdrawal-asthma exacerbation requiring IV steroids-lower extremity weakness/pain-needing inpatient evaluation and work-up.  Antimicorbials  :    Anti-infectives (From admission, onward)   Start     Dose/Rate Route Frequency Ordered Stop   01/23/21 1000  remdesivir 100 mg in sodium chloride 0.9 % 100 mL IVPB       "Followed by" Linked Group Details   100 mg 200 mL/hr over 30 Minutes Intravenous Daily 01/22/21 2004 01/27/21 0959   01/22/21 2015  remdesivir 200 mg in sodium chloride 0.9% 250 mL IVPB       "Followed by" Linked Group Details   200 mg 580 mL/hr over 30 Minutes Intravenous Once 01/22/21 2004 01/22/21 2344      Inpatient Medications  Scheduled Meds:  albuterol  2 puff Inhalation Q6H   vitamin C  500 mg Oral Daily   LORazepam  0-4 mg Intravenous Q6H   Or   LORazepam  0-4 mg Oral Q6H   [START ON 01/25/2021] LORazepam  0-4 mg Intravenous Q12H   Or   [START ON 01/25/2021] LORazepam  0-4 mg Oral Q12H   methylPREDNISolone (SOLU-MEDROL) injection  60 mg Intravenous Q12H   multivitamin with minerals  1 tablet Oral Daily   nicotine  21 mg Transdermal Daily   [START ON 01/24/2021] pantoprazole  40 mg Oral Q0600   sodium chloride flush  3 mL Intravenous Q12H   thiamine  100 mg Oral Daily   Or   thiamine  100 mg Intravenous Daily   zinc sulfate  220 mg Oral Daily   Continuous Infusions:  potassium chloride 10 mEq (01/23/21 1151)   remdesivir 100 mg in NS 100 mL 100 mg (01/23/21  0940)   PRN Meds:.acetaminophen, albuterol, chlorpheniramine-HYDROcodone, ondansetron **OR** ondansetron (ZOFRAN) IV, oxyCODONE   Time Spent in minutes  25   See all Orders from today for further details   Jeoffrey Massed M.D on 01/23/2021 at 12:00 PM  To page go to [email or web address] - use universal password  [facility] -  Office   [phone]    Objective:   Vitals:   01/23/21 0000 01/23/21 0100 01/23/21 0306 01/23/21 0400  BP: 110/78 108/77 113/77 106/66  Pulse: 91 96 97 89  Resp: 15 16 18 14   Temp: 98.1 F (36.7 C) 98 F (36.7 C) 98 F (36.7 C) 98.2 F (36.8 C)  TempSrc: Oral Oral Oral Oral  SpO2: 97% 96% 96% 94%    Wt Readings from Last 3 Encounters:  09/20/20 87.1 kg  11/02/19 90.7 kg  02/02/19 86.2 kg     Intake/Output Summary (Last 24 hours) at 01/23/2021 1200 Last data filed at 01/23/2021 1150 Gross per 24 hour  Intake 1082.58 ml  Output 1100 ml  Net -17.42 ml     Physical Exam Gen Exam:Alert awake-not in any distress HEENT:atraumatic, normocephalic Chest: Rhonchi bilaterally CVS:S1S2 regular Abdomen:soft non tender, non distended Extremities:no edema Neurology: Lower extremity exam limited by pain-but able to lift lower extremity off the bed-just about able to bend it at the knee.  Sensation grossly intact. Skin: no rash   Data Review:    CBC Recent Labs  Lab 01/22/21 1217 01/22/21 2221 01/23/21 0136 01/23/21 0828  WBC 9.5 7.3 5.8 5.4  HGB 11.8* 10.3* 10.9* 10.6*  HCT 33.4* 28.5* 30.7* 30.9*  PLT 240 185 165 163  MCV 94.4 94.1 94.2 95.7  MCH 33.3 34.0 33.4 32.8  MCHC 35.3 36.1* 35.5 34.3  RDW 17.5* 17.6* 17.3* 17.2*  LYMPHSABS  --   --  0.6*  --   MONOABS  --   --  0.1  --   EOSABS  --   --  0.0  --   BASOSABS  --   --  0.0  --     Chemistries  Recent Labs  Lab 01/22/21 1217 01/22/21 1533 01/22/21 2221 01/23/21 0136  NA 130*  --  128* 127*  K 2.9*  --  2.8* 3.4*  CL 92*  --  92* 94*  CO2 26  --  25 25  GLUCOSE 150*  --  111* 199*  BUN 5*  --  8 8  CREATININE 0.79  --  0.94 0.90  CALCIUM 8.6*  --  8.3* 8.2*  MG  --  2.0  --  2.0  AST 50*  --   --  40  ALT 25  --   --  24  ALKPHOS 62  --   --  59  BILITOT 2.0*  --   --  1.4*   ------------------------------------------------------------------------------------------------------------------ No  results for input(s): CHOL, HDL, LDLCALC, TRIG, CHOLHDL, LDLDIRECT in the last 72 hours.  No results found for: HGBA1C ------------------------------------------------------------------------------------------------------------------ No results for input(s): TSH, T4TOTAL, T3FREE, THYROIDAB in the last 72 hours.  Invalid input(s): FREET3 ------------------------------------------------------------------------------------------------------------------ Recent Labs    01/22/21 2221 01/23/21 0136  FERRITIN 229 231    Coagulation profile Recent Labs  Lab 01/22/21 1533  INR 1.1    Recent Labs    01/22/21 2221 01/23/21 0136  DDIMER <0.27 <0.27    Cardiac Enzymes No results for input(s): CKMB, TROPONINI, MYOGLOBIN in the last 168 hours.  Invalid input(s): CK ------------------------------------------------------------------------------------------------------------------ No results found for: BNP  Micro Results  Recent Results (from the past 240 hour(s))  Resp Panel by RT-PCR (Flu A&B, Covid) Nasopharyngeal Swab     Status: Abnormal   Collection Time: 01/22/21  3:41 PM   Specimen: Nasopharyngeal Swab; Nasopharyngeal(NP) swabs in vial transport medium  Result Value Ref Range Status   SARS Coronavirus 2 by RT PCR POSITIVE (A) NEGATIVE Final    Comment: RESULT CALLED TO, READ BACK BY AND VERIFIED WITH: Keitha Butte RN 1725 01/22/21 A BROWNING (NOTE) SARS-CoV-2 target nucleic acids are DETECTED.  The SARS-CoV-2 RNA is generally detectable in upper respiratory specimens during the acute phase of infection. Positive results are indicative of the presence of the identified virus, but do not rule out bacterial infection or co-infection with other pathogens not detected by the test. Clinical correlation with patient history and other diagnostic information is necessary to determine patient infection status. The expected result is Negative.  Fact Sheet for  Patients: BloggerCourse.com  Fact Sheet for Healthcare Providers: SeriousBroker.it  This test is not yet approved or cleared by the [place] FDA and  has been authorized for detection and/or diagnosis of SARS-CoV-2 by FDA under an Emergency Use Authorization (EUA).  This EUA will remain in effect (meaning this test can  be used) for the duration of  the COVID-19 declaration under Section 564(b)(1) of the Act, 21 U.S.C. section 360bbb-3(b)(1), unless the authorization is terminated or revoked sooner.     Influenza A by PCR NEGATIVE NEGATIVE Final   Influenza B by PCR NEGATIVE NEGATIVE Final    Comment: (NOTE) The Xpert Xpress SARS-CoV-2/FLU/RSV plus assay is intended as an aid in the diagnosis of influenza from Nasopharyngeal swab specimens and should not be used as a sole basis for treatment. Nasal washings and aspirates are unacceptable for Xpert Xpress SARS-CoV-2/FLU/RSV testing.  Fact Sheet for Patients: BloggerCourse.com  Fact Sheet for Healthcare Providers: SeriousBroker.it  This test is not yet approved or cleared by the [place] FDA and has been authorized for detection and/or diagnosis of SARS-CoV-2 by FDA under an Emergency Use Authorization (EUA). This EUA will remain in effect (meaning this test can be used) for the duration of the COVID-19 declaration under Section 564(b)(1) of the Act, 21 U.S.C. section 360bbb-3(b)(1), unless the authorization is terminated or revoked.  Performed at [facility], [address], [place], [place] [address]     Radiology Reports DG Chest Portable 1 View  Result Date: 01/22/2021 CLINICAL DATA:  Cough EXAM: PORTABLE CHEST 1 VIEW COMPARISON:  11/02/2019 FINDINGS: The heart size and mediastinal contours are within normal limits. Both lungs are clear. Subacute to chronic appearing bilateral lower rib fractures.  IMPRESSION: No active disease. Electronically Signed   By: Jasmine Pang M.D.   On: 01/22/2021 15:45

## 2021-01-23 NOTE — Progress Notes (Signed)
Pt admitted from ED A/Ox4. Pt walked to bred from stretcher. Pt oriented to room call bell and personal belongings within reach. Bed in low locked position with bed alarm on. Will continue to monitor.

## 2021-01-23 NOTE — TOC Initial Note (Signed)
Transition of Care [facility]) - Initial/Assessment Note    Patient Details  Name: Shane Holmes MRN: [id number] Date of Birth: [birth year]  [facility]) CM/SW Contact:    Shane Sabal, RN Phone Number: 01/23/2021, 3:21 PM  Clinical Narrative:                 Shane Holmes w patient over the phone. He states that he recently moved back to [place]. He will be staying at his sister's house after DC at [facility] [place] [facility] [place] [address]. He confirms he has no insurance or PCP. [facility] [address] [address]. He is agreeable to follow up at [facility] for assistance with establishing a PCP.  TOC pharmacy added to profile, will follow for MATCH needs.  Discussed ETOH use. He states that he does not need resources at this time, he is going to a church, and they have AA support that he plans to attend.    Expected Discharge Plan: Home/Self Care Barriers to Discharge: Continued Medical Work up   Patient Goals and CMS Choice Patient states their goals for this hospitalization and ongoing recovery are:: to go home      Expected Discharge Plan and Services Expected Discharge Plan: Home/Self Care   Discharge Planning Services: CM Consult   Living arrangements for the past 2 months: No permanent address                                      Prior Living Arrangements/Services Living arrangements for the past 2 months: No permanent address Lives with:: Siblings                   Activities of Daily Living      Permission Sought/Granted                  Emotional Assessment              Admission diagnosis:  Hematochezia [K92.1] Hypokalemia [E87.6] Lower GI bleed [K92.2] Nausea vomiting and diarrhea [R11.2, R19.7] Alcohol withdrawal syndrome without complication (HCC) [F10.230] COVID-19 [U07.1] Patient Active Problem List   Diagnosis Date Noted  . Hematochezia   . Alcohol abuse   . Acute blood loss anemia   . Lower GI bleed 01/22/2021   . Alcohol withdrawal syndrome with complication, with unspecified complication (HCC) 01/22/2021  . COVID-19 virus infection 01/22/2021  . Acute asthma exacerbation 01/22/2021  . Hypokalemia 01/22/2021  . Tobacco abuse 01/22/2021  . Hyponatremia 01/22/2021  . Dehydration 01/22/2021   PCP:  Patient, No Pcp Per Pharmacy:   [id number], [place] - [address] [place] [address] Phone: [phone] Fax: [phone]  [facility] [place], [place] - [address] [place] [address] Phone: [phone] Fax: [phone]     Social Determinants of Health (SDOH) Interventions    Readmission Risk Interventions No flowsheet data found.

## 2021-01-23 NOTE — CV Procedure (Signed)
BLE venous duplex completed.  Results can be found under chart review under CV PROC. 01/23/2021 6:01 PM Jody RVT, RDMS

## 2021-01-23 NOTE — Plan of Care (Signed)
  Problem: Education: Goal: Knowledge of risk factors and measures for prevention of condition will improve Outcome: Progressing   Problem: Coping: Goal: Psychosocial and spiritual needs will be supported Outcome: Progressing   Problem: Respiratory: Goal: Will maintain a patent airway Outcome: Progressing Goal: Complications related to the disease process, condition or treatment will be avoided or minimized Outcome: Progressing   

## 2021-01-24 DIAGNOSIS — R29898 Other symptoms and signs involving the musculoskeletal system: Secondary | ICD-10-CM

## 2021-01-24 DIAGNOSIS — K5900 Constipation, unspecified: Secondary | ICD-10-CM

## 2021-01-24 DIAGNOSIS — K602 Anal fissure, unspecified: Secondary | ICD-10-CM

## 2021-01-24 DIAGNOSIS — G629 Polyneuropathy, unspecified: Secondary | ICD-10-CM

## 2021-01-24 LAB — PHOSPHORUS: Phosphorus: 3.7 mg/dL (ref 2.5–4.6)

## 2021-01-24 LAB — CBC WITH DIFFERENTIAL/PLATELET
Abs Immature Granulocytes: 0.06 10*3/uL (ref 0.00–0.07)
Basophils Absolute: 0 10*3/uL (ref 0.0–0.1)
Basophils Relative: 0 %
Eosinophils Absolute: 0 10*3/uL (ref 0.0–0.5)
Eosinophils Relative: 0 %
HCT: 28.7 % — ABNORMAL LOW (ref 39.0–52.0)
Hemoglobin: 10.3 g/dL — ABNORMAL LOW (ref 13.0–17.0)
Immature Granulocytes: 1 %
Lymphocytes Relative: 8 %
Lymphs Abs: 0.6 10*3/uL — ABNORMAL LOW (ref 0.7–4.0)
MCH: 34.2 pg — ABNORMAL HIGH (ref 26.0–34.0)
MCHC: 35.9 g/dL (ref 30.0–36.0)
MCV: 95.3 fL (ref 80.0–100.0)
Monocytes Absolute: 0.6 10*3/uL (ref 0.1–1.0)
Monocytes Relative: 8 %
Neutro Abs: 6.4 10*3/uL (ref 1.7–7.7)
Neutrophils Relative %: 83 %
Platelets: 214 10*3/uL (ref 150–400)
RBC: 3.01 MIL/uL — ABNORMAL LOW (ref 4.22–5.81)
RDW: 17.6 % — ABNORMAL HIGH (ref 11.5–15.5)
WBC: 7.7 10*3/uL (ref 4.0–10.5)
nRBC: 0 % (ref 0.0–0.2)

## 2021-01-24 LAB — COMPREHENSIVE METABOLIC PANEL
ALT: 20 U/L (ref 0–44)
AST: 29 U/L (ref 15–41)
Albumin: 3.3 g/dL — ABNORMAL LOW (ref 3.5–5.0)
Alkaline Phosphatase: 65 U/L (ref 38–126)
Anion gap: 12 (ref 5–15)
BUN: 14 mg/dL (ref 6–20)
CO2: 22 mmol/L (ref 22–32)
Calcium: 8.3 mg/dL — ABNORMAL LOW (ref 8.9–10.3)
Chloride: 95 mmol/L — ABNORMAL LOW (ref 98–111)
Creatinine, Ser: 0.85 mg/dL (ref 0.61–1.24)
GFR, Estimated: >60 mL/min (ref >60)
Glucose, Bld: 147 mg/dL — ABNORMAL HIGH (ref 70–99)
Potassium: 4.2 mmol/L (ref 3.5–5.1)
Sodium: 129 mmol/L — ABNORMAL LOW (ref 135–145)
Total Bilirubin: 1.2 mg/dL (ref 0.3–1.2)
Total Protein: 6.4 g/dL — ABNORMAL LOW (ref 6.5–8.1)

## 2021-01-24 LAB — FERRITIN: Ferritin: 196 ng/mL (ref 24–336)

## 2021-01-24 LAB — CSF CELL COUNT WITH DIFFERENTIAL
RBC Count, CSF: 12 /mm3 — ABNORMAL HIGH
Tube #: 1
WBC, CSF: 2 /mm3 (ref 0–5)

## 2021-01-24 LAB — C-REACTIVE PROTEIN: CRP: 0.8 mg/dL (ref <1.0)

## 2021-01-24 LAB — HEPATITIS B CORE ANTIBODY, TOTAL: Hep B Core Total Ab: REACTIVE — AB

## 2021-01-24 LAB — HEPATITIS B SURFACE ANTIBODY,QUALITATIVE: Hep B S Ab: REACTIVE — AB

## 2021-01-24 LAB — MAGNESIUM: Magnesium: 2 mg/dL (ref 1.7–2.4)

## 2021-01-24 LAB — PROTEIN AND GLUCOSE, CSF
Glucose, CSF: 97 mg/dL — ABNORMAL HIGH (ref 40–70)
Total  Protein, CSF: 19 mg/dL (ref 15–45)

## 2021-01-24 LAB — HEMOGLOBIN A1C
Hgb A1c MFr Bld: 6 % — ABNORMAL HIGH (ref 4.8–5.6)
Mean Plasma Glucose: 125.5 mg/dL

## 2021-01-24 LAB — FOLATE: Folate: 4 ng/mL — ABNORMAL LOW (ref >5.9)

## 2021-01-24 LAB — D-DIMER, QUANTITATIVE: D-Dimer, Quant: 0.27 ug/mL-FEU (ref 0.00–0.50)

## 2021-01-24 LAB — HEPATITIS B SURFACE ANTIGEN: Hepatitis B Surface Ag: NONREACTIVE

## 2021-01-24 LAB — HEPATITIS C ANTIBODY: HCV Ab: NONREACTIVE

## 2021-01-24 MED ORDER — POLYETHYLENE GLYCOL 3350 17 G PO PACK
17.0000 g | PACK | Freq: Every day | ORAL | Status: DC
  Filled 2021-01-24: qty 1

## 2021-01-24 MED ORDER — LIDOCAINE 4 % EX CREA
TOPICAL_CREAM | Freq: Three times a day (TID) | CUTANEOUS | Status: DC
  Administered 2021-01-24 – 2021-01-25 (×4): 1 via TOPICAL
  Filled 2021-01-24: qty 5

## 2021-01-24 MED ORDER — CYANOCOBALAMIN 1000 MCG/ML IJ SOLN
1000.0000 ug | Freq: Every day | INTRAMUSCULAR | Status: DC
  Administered 2021-01-24 – 2021-01-26 (×3): 1000 ug via SUBCUTANEOUS
  Filled 2021-01-24 (×3): qty 1

## 2021-01-24 MED ORDER — PREDNISONE 20 MG PO TABS
40.0000 mg | ORAL_TABLET | Freq: Every day | ORAL | Status: DC
  Administered 2021-01-25: 40 mg via ORAL
  Filled 2021-01-24: qty 2

## 2021-01-24 MED ORDER — LIDOCAINE HCL (CARDIAC) PF 100 MG/5ML IV SOSY
PREFILLED_SYRINGE | INTRAVENOUS | Status: AC
  Filled 2021-01-24: qty 5

## 2021-01-24 MED ORDER — GABAPENTIN 600 MG PO TABS
300.0000 mg | ORAL_TABLET | Freq: Three times a day (TID) | ORAL | Status: DC
  Administered 2021-01-24 – 2021-01-25 (×4): 300 mg via ORAL
  Filled 2021-01-24 (×4): qty 1

## 2021-01-24 NOTE — Progress Notes (Signed)
PROGRESS NOTE                                                                                                                                                                                                             Patient Demographics:    Shane Holmes, is a 35 y.o. male, DOB - [birth year], ZOX:[id number]  Outpatient Primary MD for the patient is Patient, No Pcp Per   Admit date - 01/22/2021   LOS - 1  Chief Complaint  Patient presents with  . GI Bleeding    N/v        Brief Narrative: Patient is a 35 y.o. male with PMHx of alcohol use, asthma-presenting to the hospital with lower GI bleeding, asthma exacerbation-and lower extremity weakness/neuropathic pain.  See below for further details  COVID-19 vaccinated status: Unvaccinated  Significant Events: 2/21>> [facility] for lower GI bleed/asthma exacerbation/Covid infection  Significant studies: 2/21>>Chest x-ray: No pneumonia  COVID-19 medications: Remdesivir: 2/21>>  Antibiotics: None  Microbiology data: None  Procedures: None  Consults: Neurology, GI  DVT prophylaxis: SCDs Start: 01/22/21 2053    Subjective:   He feels better-breathing is much better.  Did have another episode of hematochezia this morning.   Assessment  & Plan :   Hematochezia: Suspicion for rectal fissure-hemoglobin stable-hematochezia slowing down per patient-started on on MiraLAX/viscous lidocaine.  GI repair recommending outpatient endoscopic evaluation.  Acute blood loss anemia: Secondary to above-follow.  EtOH abuse: Has prior history of DTs-somewhat anxious but no major signs of DTs as of now.  Continue Ativan per CIWA protocol.  Counseled-claims he is quitting after discharge from the hospital.  Asthma exacerbation: Much improved-continue bronchodilators-Taper steroids further.   COVID-19 infection: No hypoxia-no pneumonia on imaging-Remdesivir x3 days.  Fever:  afebrile O2 requirements:  SpO2: 93 %   COVID-19 Labs: Recent Labs    01/22/21 2221 01/23/21 0136 01/24/21 0045  DDIMER <0.27 <0.27 0.27  FERRITIN 229 231 196  LDH 185  --   --   CRP 0.6 0.6 0.8    No results found for: BNP  Recent Labs  Lab 01/22/21 2221  PROCALCITON 0.27    Lab Results  Component Value Date   SARSCOV2NAA POSITIVE (A) 01/22/2021    Hypokalemia: Secondary to EtOH use-repleted.  Follow.  Hyponatremia: Likely due to beer potomania-sodium slowly improving.  Continue  supportive care and close follow-up.   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  Continue work with physical therapy.  Prior hepatitis B exposure: Hepatitis B serology consistent with immunity.  HCV antibody negative as well.  Tobacco abuse: Counseled-interested in quitting-on transdermal nicotine.  GI prophylaxis: PPI  ABG: No results found for: PHART, PCO2ART, PO2ART, HCO3, TCO2, ACIDBASEDEF, O2SAT  Vent Settings: N/A    Condition - Stable  Family Communication  : None at bedside-we will update family over the next day or so.  Code Status :  Full Code  Diet :  Diet Order            Diet Carb Modified Fluid consistency: Thin; Room service appropriate? Yes  Diet effective now                  Disposition Plan  :   Status is: Inpatient  The patient will require care spanning > 2 midnights and should be moved to inpatient because: Inpatient level of care appropriate due to severity of illness  Dispo: The patient is from: Home              Anticipated d/c is to: Home              Anticipated d/c date is: 2 days              Patient currently is not medically stable to d/c.   Difficult to place patient No  Barriers to discharge: Ongoing GI bleeding--alcohol withdrawal-asthma exacerbation requiring IV steroids-lower extremity  weakness/pain-needing inpatient evaluation and work-up.  Antimicorbials  :    Anti-infectives (From admission, onward)   Start     Dose/Rate Route Frequency Ordered Stop   01/23/21 1000  remdesivir 100 mg in sodium chloride 0.9 % 100 mL IVPB       "Followed by" Linked Group Details   100 mg 200 mL/hr over 30 Minutes Intravenous Daily 01/22/21 2004 01/27/21 0959   01/22/21 2015  remdesivir 200 mg in sodium chloride 0.9% 250 mL IVPB       "Followed by" Linked Group Details   200 mg 580 mL/hr over 30 Minutes Intravenous Once 01/22/21 2004 01/22/21 2344      Inpatient Medications  Scheduled Meds: . albuterol  2 puff Inhalation Q6H  . vitamin C  500 mg Oral Daily  . cyanocobalamin  1,000 mcg Subcutaneous Daily  . gabapentin  300 mg Oral TID  . lidocaine   Topical TID  . LORazepam  0-4 mg Intravenous Q6H   Or  . LORazepam  0-4 mg Oral Q6H  . [START ON 01/25/2021] LORazepam  0-4 mg Intravenous Q12H   Or  . [START ON 01/25/2021] LORazepam  0-4 mg Oral Q12H  . methylPREDNISolone (SOLU-MEDROL) injection  40 mg Intravenous Q12H  . multivitamin with minerals  1 tablet Oral Daily  . nicotine  21 mg Transdermal Daily  . pantoprazole  40 mg Oral Q0600  . polyethylene glycol  17 g Oral Daily  . sodium chloride flush  3 mL Intravenous Q12H  . thiamine  100 mg Oral Daily  . zinc sulfate  220 mg Oral Daily   Continuous Infusions: . remdesivir 100 mg in NS 100 mL 100 mg (01/24/21 0927)   PRN Meds:.acetaminophen, albuterol, chlorpheniramine-HYDROcodone, ondansetron **OR** ondansetron (ZOFRAN) IV, oxyCODONE   Time Spent in minutes  25   See all Orders from today for  further details   Jeoffrey Massed M.D on 01/24/2021 at 2:38 PM  To page go to [email or web address] - use universal password  [facility] -  Office  [phone]    Objective:   Vitals:   01/23/21 2324 01/24/21 0434 01/24/21 0715 01/24/21 1144  BP: 115/88 122/87 110/80 116/86  Pulse: (!) 103 (!) 103 98 96  Resp: Temp: 98.1 F (36.7 C) 97.7 F (36.5 C) 98 F (36.7 C) 98 F (36.7 C)  TempSrc: Oral Oral Oral Oral  SpO2: 97% 94% 95% 93%    Wt Readings from Last 3 Encounters:  09/20/20 87.1 kg  11/02/19 90.7 kg  02/02/19 86.2 kg     Intake/Output Summary (Last 24 hours) at 01/24/2021 1438 Last data filed at 01/24/2021 0500 Gross per 24 hour  Intake 780 ml  Output 1500 ml  Net -720 ml     Physical Exam Gen Exam:Alert awake-not in any distress HEENT:atraumatic, normocephalic Chest: B/L clear to auscultation anteriorly CVS:S1S2 regular Abdomen:soft non tender, non distended Extremities:no edema Neurology: Unchanged compared to yesterday-able to just about barely stand independently, able to kick legs off the chair but weak.   Skin: no rash   Data Review:    CBC Recent Labs  Lab 01/22/21 2221 01/23/21 0136 01/23/21 0828 01/23/21 1501 01/24/21 0045  WBC 7.3 5.8 5.4 6.1 7.7  HGB 10.3* 10.9* 10.6* 10.9* 10.3*  HCT 28.5* 30.7* 30.9* 32.0* 28.7*  PLT 185 165 163 182 214  MCV 94.1 94.2 95.7 97.3 95.3  MCH 34.0 33.4 32.8 33.1 34.2*  MCHC 36.1* 35.5 34.3 34.1 35.9  RDW 17.6* 17.3* 17.2* 17.3* 17.6*  LYMPHSABS  --  0.6*  --   --  0.6*  MONOABS  --  0.1  --   --  0.6  EOSABS  --  0.0  --   --  0.0  BASOSABS  --  0.0  --   --  0.0    Chemistries  Recent Labs  Lab 01/22/21 1217 01/22/21 1533 01/22/21 2221 01/23/21 0136 01/24/21 0045  NA 130*  --  128* 127* 129*  K 2.9*  --  2.8* 3.4* 4.2  CL 92*  --  92* 94* 95*  CO2 26  --  GLUCOSE 150*  --  111* 199* 147*  BUN 5*  --  CREATININE 0.79  --  0.94 0.90 0.85  CALCIUM 8.6*  --  8.3* 8.2* 8.3*  MG  --  2.0  --  2.0 2.0  AST 50*  --   --  40 29  ALT 25  --   --  24 20  ALKPHOS 62  --   --  59 65  BILITOT 2.0*  --   --  1.4* 1.2   ------------------------------------------------------------------------------------------------------------------ No results for input(s): CHOL, HDL, LDLCALC, TRIG,  CHOLHDL, LDLDIRECT in the last 72 hours.  No results found for: HGBA1C ------------------------------------------------------------------------------------------------------------------ Recent Labs    01/23/21 1108  TSH 0.408   ------------------------------------------------------------------------------------------------------------------ Recent Labs    01/23/21 0136 01/23/21 1108 01/24/21 0045  VITAMINB12  --  234  --   FERRITIN 231  --  196    Coagulation profile Recent Labs  Lab 01/22/21 1533  INR 1.1    Recent Labs    01/23/21 0136 01/24/21 0045  DDIMER <0.27 0.27    Cardiac Enzymes No results for input(s): CKMB, TROPONINI, MYOGLOBIN in the last 168 hours.  Invalid input(s): CK ------------------------------------------------------------------------------------------------------------------  No results found for: BNP  Micro Results Recent Results (from the past 240 hour(s))  Resp Panel by RT-PCR (Flu A&B, Covid) Nasopharyngeal Swab     Status: Abnormal   Collection Time: 01/22/21  3:41 PM   Specimen: Nasopharyngeal Swab; Nasopharyngeal(NP) swabs in vial transport medium  Result Value Ref Range Status   SARS Coronavirus 2 by RT PCR POSITIVE (A) NEGATIVE Final    Comment: RESULT CALLED TO, READ BACK BY AND VERIFIED WITH: Keitha Butte RN 1725 01/22/21 A BROWNING (NOTE) SARS-CoV-2 target nucleic acids are DETECTED.  The SARS-CoV-2 RNA is generally detectable in upper respiratory specimens during the acute phase of infection. Positive results are indicative of the presence of the identified virus, but do not rule out bacterial infection or co-infection with other pathogens not detected by the test. Clinical correlation with patient history and other diagnostic information is necessary to determine patient infection status. The expected result is Negative.  Fact Sheet for Patients: BloggerCourse.com  Fact Sheet for Healthcare  Providers: SeriousBroker.it  This test is not yet approved or cleared by the [place] FDA and  has been authorized for detection and/or diagnosis of SARS-CoV-2 by FDA under an Emergency Use Authorization (EUA).  This EUA will remain in effect (meaning this test can  be used) for the duration of  the COVID-19 declaration under Section 564(b)(1) of the Act, 21 U.S.C. section 360bbb-3(b)(1), unless the authorization is terminated or revoked sooner.     Influenza A by PCR NEGATIVE NEGATIVE Final   Influenza B by PCR NEGATIVE NEGATIVE Final    Comment: (NOTE) The Xpert Xpress SARS-CoV-2/FLU/RSV plus assay is intended as an aid in the diagnosis of influenza from Nasopharyngeal swab specimens and should not be used as a sole basis for treatment. Nasal washings and aspirates are unacceptable for Xpert Xpress SARS-CoV-2/FLU/RSV testing.  Fact Sheet for Patients: BloggerCourse.com  Fact Sheet for Healthcare Providers: SeriousBroker.it  This test is not yet approved or cleared by the [place] FDA and has been authorized for detection and/or diagnosis of SARS-CoV-2 by FDA under an Emergency Use Authorization (EUA). This EUA will remain in effect (meaning this test can be used) for the duration of the COVID-19 declaration under Section 564(b)(1) of the Act, 21 U.S.C. section 360bbb-3(b)(1), unless the authorization is terminated or revoked.  Performed at [facility], [address], [place], [place] [address]     Radiology Reports DG Chest Portable 1 View  Result Date: 01/22/2021 CLINICAL DATA:  Cough EXAM: PORTABLE CHEST 1 VIEW COMPARISON:  11/02/2019 FINDINGS: The heart size and mediastinal contours are within normal limits. Both lungs are clear. Subacute to chronic appearing bilateral lower rib fractures. IMPRESSION: No active disease. Electronically Signed   By: Jasmine Pang M.D.   On:  01/22/2021 15:45   VAS [place] LOWER EXTREMITY VENOUS (DVT)  Result Date: 01/23/2021  Lower Venous DVT Study Indications: Edema.  Risk Factors: Covid+. Comparison Study: No previous exams Performing Technologist: Ernestene Mention  Examination Guidelines: A complete evaluation includes B-mode imaging, spectral Doppler, color Doppler, and power Doppler as needed of all accessible portions of each vessel. Bilateral testing is considered an integral part of a complete examination. Limited examinations for reoccurring indications may be performed as noted. The reflux portion of the exam is performed with the patient in reverse Trendelenburg.  +---------+---------------+---------+-----------+----------+--------------+ RIGHT    CompressibilityPhasicitySpontaneityPropertiesThrombus Aging +---------+---------------+---------+-----------+----------+--------------+ CFV      Full           Yes  Yes                                 +---------+---------------+---------+-----------+----------+--------------+ SFJ      Full                                                        +---------+---------------+---------+-----------+----------+--------------+ FV Prox  Full           Yes      Yes                                 +---------+---------------+---------+-----------+----------+--------------+ FV Mid   Full           Yes      Yes                                 +---------+---------------+---------+-----------+----------+--------------+ FV DistalFull           Yes      Yes                                 +---------+---------------+---------+-----------+----------+--------------+ PFV      Full                                                        +---------+---------------+---------+-----------+----------+--------------+ POP      Full           Yes      Yes                                 +---------+---------------+---------+-----------+----------+--------------+ PTV      Full                                                         +---------+---------------+---------+-----------+----------+--------------+ PERO     Full                                                        +---------+---------------+---------+-----------+----------+--------------+   +---------+---------------+---------+-----------+----------+--------------+ LEFT     CompressibilityPhasicitySpontaneityPropertiesThrombus Aging +---------+---------------+---------+-----------+----------+--------------+ CFV      Full           Yes      Yes                                 +---------+---------------+---------+-----------+----------+--------------+ SFJ      Full                                                        +---------+---------------+---------+-----------+----------+--------------+  FV Prox  Full           Yes      Yes                                 +---------+---------------+---------+-----------+----------+--------------+ FV Mid   Full           Yes      Yes                                 +---------+---------------+---------+-----------+----------+--------------+ FV DistalFull           Yes      Yes                                 +---------+---------------+---------+-----------+----------+--------------+ PFV      Full                                                        +---------+---------------+---------+-----------+----------+--------------+ POP      Full           Yes      Yes                                 +---------+---------------+---------+-----------+----------+--------------+ PTV      Full                                                        +---------+---------------+---------+-----------+----------+--------------+ PERO     Full                                                        +---------+---------------+---------+-----------+----------+--------------+     Summary: BILATERAL: - No evidence of deep vein thrombosis seen  in the lower extremities, bilaterally. - No evidence of superficial venous thrombosis in the lower extremities, bilaterally. -No evidence of popliteal cyst, bilaterally.   *See table(s) above for measurements and observations. Electronically signed by Waverly Ferrari MD on 01/23/2021 at 6:26:32 PM.    Final

## 2021-01-24 NOTE — Evaluation (Signed)
Physical Therapy Evaluation Patient Details Name: Shane Holmes MRN: [id number] DOB: 1986/03/14 Today's Date: 01/24/2021   History of Present Illness  Patient is a 35 y.o. male with PMHx of alcohol use, asthma-presenting to the hospital with several days history of cough/wheezing, bilateral lower extremity pain and hematochezia. 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 Pt will benefit from skilled PT to increase their independence and safety with mobility to allow discharge to the venue listed below.       Follow Up Recommendations CIR;Other (comment) (if slow progress, will need post-acute rehab)    Equipment Recommendations  Rolling walker with 5" wheels;3in1 (PT)    Recommendations for Other Services Rehab consult     Precautions / Restrictions Precautions Precautions: Fall Precaution Comments: Covid      Mobility  Bed Mobility               General bed mobility comments: pt seated up in chair    Transfers Overall transfer level: Needs assistance Equipment used: Rolling walker (2 wheeled) Transfers: Sit to/from Stand Sit  to Stand: Min assist Stand pivot transfers: Min assist       General transfer comment: Noted heavy dependence on UEs to push and support with transition to standing; Also dependent on UEs to control descent to sit  Ambulation/Gait Ambulation/Gait assistance: Min assist Gait Distance (Feet): 15 Feet Assistive device: Rolling walker (2 wheeled) Gait Pattern/deviations: Decreased step length - right;Decreased step length - left;Decreased stride length;Trunk flexed     General Gait Details: Small steps and heavy dependence on RW for support; doesn't quite get his knees into extension in stance; hunches over the RW  Stairs            Wheelchair Mobility    Modified Rankin (Stroke Patients Only)       Balance Overall balance assessment: Needs assistance Sitting-balance support: Feet supported Sitting balance-Leahy Scale: Good     Standing balance support: During functional activity Standing balance-Leahy Scale: Poor Standing balance comment: Heavy dependence on RW and UEs for support; cued to fully extend both knees in static stance "push your feet/heels into the floor"; achieved near full bil knee extension briefly, but could not hold for more than 3 seconds before sinking back into knee and hip flexion                             Pertinent Vitals/Pain Pain Assessment: 0-10 Pain Score: 10-Worst pain ever Faces Pain Scale: Hurts little more Pain Location: Proximal musculature of bil LEs Pain Descriptors / Indicators: Aching;Burning;Cramping;Constant Pain Intervention(s): Premedicated before session;Repositioned    Home Living Family/patient expects to be discharged to:: Private  residence Living Arrangements: Other (Comment) (lives with sisiter) Available Help at Discharge: Available PRN/intermittently Type of Home: House Home Access: Stairs to enter Entrance Stairs-Rails: None Entrance Stairs-Number of Steps: 2 Home Layout: One level        Prior  Function Level of Independence: Independent         Comments: Pt endorses a recent h/o falls     Hand Dominance   Dominant Hand: Right    Extremity/Trunk Assessment   Upper Extremity Assessment Upper Extremity Assessment: Defer to OT evaluation    Lower Extremity Assessment Lower Extremity Assessment: Generalized weakness (Significant functional weakness with heavy dependence on UE support for sit>stand, and during amb)    Cervical / Trunk Assessment Cervical / Trunk Assessment: Normal  Communication   Communication: No difficulties  Cognition Arousal/Alertness: Awake/alert Behavior During Therapy: WFL for tasks assessed/performed Overall Cognitive Status: Within Functional Limits for tasks assessed (for simple mobility tasks)                                 General Comments: Noted easily distracted      General Comments General comments (skin integrity, edema, etc.): Session conducted on room air and O2 sats stayed in mid to upper 90s; HR up to 121 with minimal amb in room    Exercises     Assessment/Plan    PT Assessment Patient needs continued PT services  PT Problem List Decreased strength;Decreased activity tolerance;Decreased mobility;Decreased balance;Decreased coordination;Decreased knowledge of use of DME;Decreased safety awareness;Decreased knowledge of precautions;Pain       PT Treatment Interventions DME instruction;Gait training;Stair training;Functional mobility training;Therapeutic activities;Therapeutic exercise;Balance training;Neuromuscular re-education;Cognitive remediation;Patient/family education;Wheelchair mobility training    PT Goals (Current goals can be found in the Care Plan section)  Acute Rehab PT Goals Patient Stated Goal: Did not state this session PT Goal Formulation: With patient Time For Goal Achievement: 02/07/21 Potential to Achieve Goals: Good    Frequency Min 3X/week   Barriers to discharge Decreased  caregiver support;Other (comment) Family works during the day; Must be independent or modified independent to dc home; If slow progress, will need post-acute rehab    Co-evaluation               AM-PAC PT "6 Clicks" Mobility  Outcome Measure Help needed turning from your back to your side while in a flat bed without using bedrails?: A Little Help needed moving from lying on your back to sitting on the side of a flat bed without using bedrails?: A Little Help needed moving to and from a bed to a chair (including a wheelchair)?: A Little Help needed standing up from a chair using your arms (e.g., wheelchair or bedside chair)?: A Lot Help needed to walk in hospital room?: A Lot Help needed climbing 3-5 steps with a railing? : Total 6 Click Score: 14    End of Session Equipment Utilized During Treatment: Gait belt Activity Tolerance: Patient limited by pain Patient left: in chair;with call bell/phone within reach;with chair alarm set Nurse Communication: Mobility status PT Visit Diagnosis: Unsteadiness on feet (R26.81);Other abnormalities of gait and mobility (R26.89);Pain Pain - Right/Left:  (Bilateral) Pain - part of body: Leg    Time: 6754-4920 PT Time Calculation (min) (ACUTE ONLY): 11 min   Charges:   PT Evaluation $PT Eval Moderate Complexity: 1 Mod          Van Clines, PT  Acute Rehabilitation Services Pager [phone]  Office [phone]   Levi Aland 01/24/2021, 2:37 PM

## 2021-01-24 NOTE — Progress Notes (Addendum)
Attending physician's note   I have taken an interval history, reviewed the chart and examined the patient. I agree with the Advanced Practitioner's note, impression, and recommendations as outlined.  1) Medic easier 2) Dyschezia 3) Rectal fissure -Start MiraLAX 1 cap/day to keep stools soft without straining to have BM.  If stools become too loose/frequent, can reduce to Colace and titrate to effect -Start viscous lidocaine.  After discharge, can start on topical NTG 0.125% BID x6 weeks (requires compounding pharmacy for this or topical CCB) -After discharge, will benefit from sitz bath up to QID -Will eventually need colonoscopy as an outpatient.  This can be done in 8+ weeks allowing for fissure healing  4) Normocytic anemia -Multifactorial nature, but stable.  Anemia not rebounding appropriate as an outpatient, plan for EGD at time of colonoscopy as above  5) COVID-19 -On remdesivir, steroids  6) Hepatitis B exposure - HBsAb+, HBsAg- c/w immunity.   - Check HBcAb -Liver enzymes now normal   7) EtOH use disorder -As above, LFTs have normalized -CIWA -Labs for concomitant liver disease pending and can be followed as an outpatient -Again, discussed complete cessation of all EtOH  GI service will sign off at this time.  Please not hesitate to contact us with additional questions or concerns.  Will otherwise start arrangements for outpatient follow-up  Doristine Locks, DO, FACG [phone] office                                                                 Daily Rounding Note  01/24/2021, 9:05 AM  LOS: 1 day   SUBJECTIVE:   Chief complaint: Rectal bleeding and pain  Red blood along with brown stools continued.  Painful defecation. No nausea, no abdominal pain.  Tolerating carb modified diet. Lower extremity pain persists, swelling from a few days ago improved.  OBJECTIVE:         Vital signs in last 24 hours:    Temp:  [97.7 F (36.5 C)-98.3 F (36.8 C)] 98  F (36.7 C) (02/23 0715) Pulse Rate:  [87-103] 98 (02/23 0715) Resp:  [10-20] 13 (02/23 0715) BP: (110-130)/(75-88) 110/80 (02/23 0715) SpO2:  [92 %-97 %] 95 % (02/23 0715) Last BM Date: 01/23/21 There were no vitals filed for this visit. General: Looks moderately ill but comfortable, alert. Heart: RRR. Chest: No labored breathing or cough. Abdomen: Soft without tenderness.  Active bowel sounds.  Not distended. Rectal: 2 anorectal fissures noted.  These were not bleeding.  No visible hemorrhoids or red blood. Extremities: No CCE. Neuro/Psych: Oriented x3.  Alert.  Moves all 4 limbs without tremors.  Strength not tested.  Fluid speech.  Intake/Output from previous day: 02/22 0701 - 02/23 0700 In: 780 [P.O.:780] Out: 2200 [Urine:2200]  Intake/Output this shift: No intake/output data recorded.  Lab Results: Recent Labs    01/23/21 0828 01/23/21 1501 01/24/21 0045  WBC 5.4 6.1 7.7  HGB 10.6* 10.9* 10.3*  HCT 30.9* 32.0* 28.7*  PLT 163 182 214   BMET Recent Labs    01/22/21 2221 01/23/21 0136 01/24/21 0045  NA 128* 127* 129*  K 2.8* 3.4* 4.2  CL 92* 94* 95*  CO2 25 25 22   GLUCOSE 111* 199* 147*  BUN 8 8 14   CREATININE 0.94 0.90  0.85  CALCIUM 8.3* 8.2* 8.3*   LFT Recent Labs    01/22/21 1217 01/23/21 0136 01/24/21 0045  PROT 6.4* 6.1* 6.4*  ALBUMIN 3.4* 3.1* 3.3*  AST 50* 40 29  ALT 25 24 20   ALKPHOS 62 59 65  BILITOT 2.0* 1.4* 1.2   PT/INR Recent Labs    01/22/21 1533  LABPROT 13.9  INR 1.1   Hepatitis Panel Recent Labs    01/24/21 0045  HEPBSAG NON REACTIVE  HCVAB NON REACTIVE    Studies/Results: DG Chest Portable 1 View  Result Date: 01/22/2021 CLINICAL DATA:  Cough EXAM: PORTABLE CHEST 1 VIEW COMPARISON:  11/02/2019 FINDINGS: The heart size and mediastinal contours are within normal limits. Both lungs are clear. Subacute to chronic appearing bilateral lower rib fractures. IMPRESSION: No active disease. Electronically Signed   By: 14/12/2018 M.D.   On: 01/22/2021 15:45   VAS 01/24/2021 LOWER EXTREMITY VENOUS (DVT)  Result Date: 01/23/2021 Summary: BILATERAL: - No evidence of deep vein thrombosis seen in the lower extremities, bilaterally. - No evidence of superficial venous thrombosis in the lower extremities, bilaterally. -No evidence of popliteal cyst, bilaterally.   *See table(s) above for measurements and observations. Electronically signed by 01/25/2021 MD on 01/23/2021 at 6:26:32 PM.    Final    Scheduled Meds: . albuterol  2 puff Inhalation Q6H  . vitamin C  500 mg Oral Daily  . cyanocobalamin  1,000 mcg Subcutaneous Daily  . LORazepam  0-4 mg Intravenous Q6H   Or  . LORazepam  0-4 mg Oral Q6H  . [START ON 01/25/2021] LORazepam  0-4 mg Intravenous Q12H   Or  . [START ON 01/25/2021] LORazepam  0-4 mg Oral Q12H  . methylPREDNISolone (SOLU-MEDROL) injection  40 mg Intravenous Q12H  . multivitamin with minerals  1 tablet Oral Daily  . nicotine  21 mg Transdermal Daily  . pantoprazole  40 mg Oral Q0600  . sodium chloride flush  3 mL Intravenous Q12H  . thiamine  100 mg Oral Daily   Or  . thiamine  100 mg Intravenous Daily  . zinc sulfate  220 mg Oral Daily   Continuous Infusions: . remdesivir 100 mg in NS 100 mL 100 mg (01/23/21 0940)   PRN Meds:.acetaminophen, albuterol, chlorpheniramine-HYDROcodone, ondansetron **OR** ondansetron (ZOFRAN) IV, oxyCODONE  ASSESMENT:   *    Hematochezia with rectal pain. Rectal fissures on exam.  *    Alcohol use disorder, dependence. Transaminase pattern consistent with alcoholic.  LFTs improved.   Hep B surface Ab reactive. Hep A total antibody nonreactive.  HCV nonreactive.  Hep B surface Ag nonreactive. CIWA protocol in process. Pending labs include ceruloplasmin, IgG, ANA, smooth muscle/mitochondrial antibodies  *   COVID-19 positive.  On remdesivir, methylprednisolone.  *    Normocytic anemia.  *    Hyponatremia.  *   Hypokalemia, improved, not yet  resolved.  *   Hyperglycemia.  Looks as if he may have new diagnosis of DM 2.   PLAN   * Hep B core Ab ordered.  Await pending lab work to rule out other metabolic, autoimmune causes of liver disease.  *   Supportive care    01/25/21  01/24/2021, 9:05 AM Phone [phone]

## 2021-01-24 NOTE — Progress Notes (Signed)
Occupational Therapy Evaluation  Pt presents to OT with the below listed deficits.  He currently requires min A for ADLs, but fatigues quite rapidly and is at risk for injury due to falls. He was able to ambulate to BR with PT prior to OT session, but unable to to ambulate with OT due to LE fatigue and weakness.  He reports he lives with his sister and was mod I with ADLs until just recently when he began experiencing weakness which led to several falls.    Recommend CIR.      01/24/21 1300  OT Visit Information  Last OT Received On 01/24/21  Assistance Needed +1  History of Present Illness This 35 y.o. male admitted with 3 day h/o blood in stool, N/V, bil. LE edema and pain, SOB, fever, and sore throat.  Dx: COVID-19 and rectal fissure. Due to ascending bil. LE weakness, and stocking distrubution sensory loss, he is being worked up for GBS.    PMH includes: ETOH use disorder, asthma  Precautions  Precautions Fall  Home Living  Family/patient expects to be discharged to: Private residence  Living Arrangements Other relatives  Available Help at Discharge Family;Available PRN/intermittently  Type of Home House  Home Access Stairs to enter  Entrance Stairs-Number of Steps 3  Home Layout One level  Bathroom Publishing copy  Prior Function  Level of Independence Independent  Comments Pt endorses a recent h/o falls  Communication  Communication No difficulties  Pain Assessment  Pain Assessment Faces  Faces Pain Scale 4  Pain Location bil. LEs  Pain Descriptors / Indicators Aching  Pain Intervention(s) Monitored during session;Repositioned  Cognition  Arousal/Alertness Awake/alert  Behavior During Therapy WFL for tasks assessed/performed  Overall Cognitive Status Within Functional Limits for tasks assessed  General Comments WFL for basic tasks.  He demosntrates impaired safety awareness, but anticipate that may be baseline  Upper Extremity  Assessment  Upper Extremity Assessment Generalized weakness (bil. UEs mildly tremulous)  Lower Extremity Assessment  Lower Extremity Assessment Defer to PT evaluation  Cervical / Trunk Assessment  Cervical / Trunk Assessment Normal  ADL  Overall ADL's  Needs assistance/impaired  Eating/Feeding Modified independent  Grooming Wash/dry face;Wash/dry hands;Oral care;Brushing hair;Minimal assistance;Standing  Upper Body Bathing Set up;Sitting  Lower Body Bathing Minimal assistance;Sit to/from stand  Upper Body Dressing  Set up;Supervision/safety;Sitting  Lower Body Dressing Minimal assistance;Sit to/from Scientist, research (life sciences) Minimal assistance;Ambulation;Comfort height toilet;RW  Toileting- Clothing Manipulation and Hygiene Minimal assistance;Sit to/from stand  Functional mobility during ADLs Minimal assistance;Rolling walker  General ADL Comments pt fatugues quickly with activty  Bed Mobility  General bed mobility comments pt seated up in chair  Transfers  Overall transfer level Needs assistance  Equipment used Rolling walker (2 wheeled)  Transfers Sit to/from [employer] Transfers  Sit to Stand Min assist  Stand pivot transfers Min assist  General transfer comment assist to power up into standing and assist for balance  Balance  Overall balance assessment Needs assistance  Sitting-balance support Feet supported  Sitting balance-Leahy Scale Good  Standing balance support During functional activity  Standing balance-Leahy Scale Poor  Standing balance comment pt is able to maintain static standing without UE support, but becomes quite tremulous placing him at risk for falls  General Comments  General comments (skin integrity, edema, etc.) VSS  OT - End of Session  Equipment Utilized During Treatment Gait belt;Rolling walker  Activity Tolerance Patient tolerated treatment well  Patient left in chair;with  call bell/phone within reach;with chair alarm set  Nurse Communication  Mobility status  OT Assessment  OT Recommendation/Assessment Patient needs continued OT Services  OT Visit Diagnosis Unsteadiness on feet (R26.81)  OT Problem List Decreased strength;Decreased activity tolerance;Impaired balance (sitting and/or standing);Decreased safety awareness;Pain;Decreased knowledge of use of DME or AE  OT Plan  OT Frequency (ACUTE ONLY) Min 2X/week  OT Treatment/Interventions (ACUTE ONLY) Self-care/ADL training;Therapeutic exercise;Energy conservation;DME and/or AE instruction;Therapeutic activities;Patient/family education;Balance training  AM-PAC OT "6 Clicks" Daily Activity Outcome Measure (Version 2)  Help from another person eating meals? 4  Help from another person taking care of personal grooming? 3  Help from another person toileting, which includes using toliet, bedpan, or urinal? 3  Help from another person bathing (including washing, rinsing, drying)? 3  Help from another person to put on and taking off regular upper body clothing? 3  Help from another person to put on and taking off regular lower body clothing? 3  6 Click Score 19  OT Recommendation  Follow Up Recommendations CIR  OT Equipment 3 in 1 bedside commode;Tub/shower bench  Individuals Consulted  Consulted and Agree with Results and Recommendations Patient  Acute Rehab OT Goals  Patient Stated Goal to walk out of here  OT Goal Formulation With patient  Time For Goal Achievement 02/07/21  Potential to Achieve Goals Good  OT Time Calculation  OT Start Time (ACUTE ONLY) 1052  OT Stop Time (ACUTE ONLY) 1118  OT Time Calculation (min) 26 min  OT General Charges  $OT Visit 1 Visit  OT Evaluation  $OT Eval Moderate Complexity 1 Mod  OT Treatments  $Therapeutic Activity 8-22 mins  Written Expression  Dominant Hand Right  Eber Jones., OTR/L Acute Rehabilitation Services Pager [phone] Office [phone]

## 2021-01-24 NOTE — Procedures (Signed)
LUMBAR PUNCTURE (SPINAL TAP) PROCEDURE NOTE  Indication: GBS   Proceduralists: Milon Dikes, MD Assistant: Maren Reamer NP   Risks of the procedure were dicussed with the patient including post-LP headache, bleeding, infection, weakness/numbness of legs(radiculopathy), death.    Consent obtained from: patient or relative    Procedure Note Procedure was performed sitting position. OP was not measured. The patient was prepped and draped, and using sterile technique a 20 gauge quinke spinal needle was inserted in the L4-5 space Approximately 18 cc of CSF were obtained and sent for analysis.  Patient tolerated the procedure well and blood loss was minimal.   -- Milon Dikes, MD Neurologist [facility] Pager: [phone]

## 2021-01-24 NOTE — Progress Notes (Signed)
Inpatient Rehabilitation Admissions Coordinator  Inpatient rehab consult./prescreen received. Noted COVID + 01/22/2021. Patients are eligible to be considered for admit to the [facility] when cleared from airborne precautions by acute MD or Infectious disease regardless of onset day.  Please call me with any questions.    Ottie Glazier, RN, MSN Rehab Admissions Coordinator [phone] 01/24/2021 3:56 PM

## 2021-01-24 NOTE — Consult Note (Signed)
Neurology Consultation  Reason for Consult: lower extremity weakness Referring Physician: Windell NorfolkS. Ghimire, MD  CC: pain and weakness in lower extremities  History is obtained from: patient  HPI: Shane Holmes is a 35 y.o. male with a PMHX of asthma and ETOH abuse up to 12 beers a day. Shane Holmes presented to [facility] ED on 01/22/21 with c/o fatigue, 3 day history of blood in his stool, n/v, and bilateral leg swelling with pain. He was incidentally found to be COVID +. He has been seen by GI who found rectal fissue and have started treatment for that. GI plan is for colonoscopy and EGD as out patient. As well as, labs for concomitant liver disease.    Per chart, patient has had exposure to Hepatitis B in the past and have ordered various test to confirm. HBsAB+ and HBsAg- are consistent with immunity.    For COVID, he has been treated with Remdesivir and steroids. He is not having any difficulties with SOB.   Neurology asked to consult today due to patient's weakness and pain in lower extremities.   Patient's story: states he has about one week of numbness and pain beginning in his feet and ascending to the knee over a few days. He states lack of ability to ambulate normally was due to the pain in his legs. He states his pain was worse at night and would also wake him up from sleep. Describes pain to lower extremities as throbbing with numbness and tingling. His pain culminated about 2 days prior to admission, but what brought him to ED, was the BRBPR. He has had no difficulty with truncal weakness, incontinence, or urinary retention. He denies history of DM II. He has no back pain but says he has been told he has a few bulging discs. He denies any viral type illness prior to admission with the exception of the COVID + finding. He did not get COVID vaccinations.   Patient was put on Neurontin per medicine. Patient had first PT today. States he is able to push up from sitting position using arms to chair, but has  to have assistance to stand to the walker.   ROS: ROS was performed and is negative except as noted in the HPI.    Past Medical History:  Diagnosis Date  . Asthma   . Back pain   ETOH abuse  Family History  Problem Relation Age of Onset  . Diabetes Neg Hx   . Hypertension Neg Hx     Social History:   reports that he has been smoking cigarettes. He has never used smokeless tobacco. He reports current alcohol use of about 6.0 standard drinks of alcohol per week. He reports that he does not use drugs.  He admits to 12 beers per day.   Medications  Current Facility-Administered Medications:  .  acetaminophen (TYLENOL) tablet 650 mg, 650 mg, Oral, Q6H PRN, Doutova, Anastassia, MD, 650 mg at 01/23/21 0514 .  albuterol (VENTOLIN HFA) 108 (90 Base) MCG/ACT inhaler 2 puff, 2 puff, Inhalation, Q6H, Doutova, Anastassia, MD, 2 puff at 01/24/21 1239 .  albuterol (VENTOLIN HFA) 108 (90 Base) MCG/ACT inhaler 2 puff, 2 puff, Inhalation, Q4H PRN, Doutova, Anastassia, MD, 2 puff at 01/24/21 0549 .  ascorbic acid (VITAMIN C) tablet 500 mg, 500 mg, Oral, Daily, Doutova, Anastassia, MD, 500 mg at 01/24/21 0914 .  chlorpheniramine-HYDROcodone (TUSSIONEX) 10-8 MG/5ML suspension 5 mL, 5 mL, Oral, Q12H PRN, Doutova, Anastassia, MD .  cyanocobalamin ((VITAMIN B-12)) injection 1,000  mcg, 1,000 mcg, Subcutaneous, Daily, Ghimire, Werner Lean, MD, 1,000 mcg at 01/24/21 0934 .  gabapentin (NEURONTIN) tablet 300 mg, 300 mg, Oral, TID, Ghimire, Shanker M, MD, 300 mg at 01/24/21 1238 .  lidocaine (LMX) 4 % cream, , Topical, TID, Dianah Field, PA-C, 1 application at 01/24/21 1239 .  LORazepam (ATIVAN) injection 0-4 mg, 0-4 mg, Intravenous, Q6H **OR** LORazepam (ATIVAN) tablet 0-4 mg, 0-4 mg, Oral, Q6H, Doutova, Anastassia, MD .  Melene Muller ON 01/25/2021] LORazepam (ATIVAN) injection 0-4 mg, 0-4 mg, Intravenous, Q12H **OR** [START ON 01/25/2021] LORazepam (ATIVAN) tablet 0-4 mg, 0-4 mg, Oral, Q12H, Doutova, Anastassia,  MD .  methylPREDNISolone sodium succinate (SOLU-MEDROL) 40 mg/mL injection 40 mg, 40 mg, Intravenous, Q12H, Ghimire, Werner Lean, MD, 40 mg at 01/24/21 0914 .  multivitamin with minerals tablet 1 tablet, 1 tablet, Oral, Daily, Doutova, Anastassia, MD, 1 tablet at 01/24/21 0914 .  nicotine (NICODERM CQ - dosed in mg/24 hours) patch 21 mg, 21 mg, Transdermal, Daily, Doutova, Anastassia, MD, 21 mg at 01/24/21 0919 .  ondansetron (ZOFRAN) tablet 4 mg, 4 mg, Oral, Q6H PRN **OR** ondansetron (ZOFRAN) injection 4 mg, 4 mg, Intravenous, Q6H PRN, Doutova, Anastassia, MD .  oxyCODONE (Oxy IR/ROXICODONE) immediate release tablet 10 mg, 10 mg, Oral, Q4H PRN, Maretta Bees, MD, 10 mg at 01/24/21 1005 .  pantoprazole (PROTONIX) EC tablet 40 mg, 40 mg, Oral, Q0600, Dianah Field, PA-C, 40 mg at 01/24/21 0548 .  polyethylene glycol (MIRALAX / GLYCOLAX) packet 17 g, 17 g, Oral, Daily, Gribbin, Sarah J, PA-C .  [COMPLETED] remdesivir 200 mg in sodium chloride 0.9% 250 mL IVPB, 200 mg, Intravenous, Once, Stopped at 01/22/21 2344 **FOLLOWED BY** remdesivir 100 mg in sodium chloride 0.9 % 100 mL IVPB, 100 mg, Intravenous, Daily, Doutova, Anastassia, MD, Last Rate: 200 mL/hr at 01/24/21 0927, 100 mg at 01/24/21 0927 .  sodium chloride flush (NS) 0.9 % injection 3 mL, 3 mL, Intravenous, Q12H, Doutova, Anastassia, MD, 3 mL at 01/24/21 0920 .  thiamine tablet 100 mg, 100 mg, Oral, Daily, 100 mg at 01/24/21 0914 **OR** [DISCONTINUED] thiamine (B-1) injection 100 mg, 100 mg, Intravenous, Daily, Doutova, Anastassia, MD .  zinc sulfate capsule 220 mg, 220 mg, Oral, Daily, Doutova, Anastassia, MD, 220 mg at 01/24/21 5573   Exam: Current vital signs: BP 116/86 (BP Location: Right Arm)   Pulse 96   Temp 98 F (36.7 C) (Oral)   Resp 15   SpO2 93%  Vital signs in last 24 hours: Temp:  [97.7 F (36.5 C)-98.3 F (36.8 C)] 98 F (36.7 C) (02/23 1144) Pulse Rate:  [96-103] 96 (02/23 1144) Resp:  [13-20] 15 (02/23  1144) BP: (110-130)/(80-88) 116/86 (02/23 1144) SpO2:  [93 %-97 %] 93 % (02/23 1144)  GENERAL: Awake, alert in NAD HEENT: - Normocephalic and atraumatic.  LUNGS - Normal respiratory effort.  CV - RRR Ext: warm, well perfused Psych: Calm, pleasant.  NEURO:  Mental Status: AA&Ox3  Speech/Language: speech is without dysarthria or aphasia. Comprehension intact. Cranial Nerves:  II: PERRL. visual fields full.  III, IV, VI: EOMI. Lid elevation symmetric and full.  V: sensation is intact and symmetrical to face. Moves jaw back and forth.  VII: Smile is symmetrical. Able to puff cheeks and raise eyebrows.  VIII:hearing intact to voice IX, X: palate elevation is symmetric. Phonation normal.  XI: normal sternocleidomastoid and trapezius muscle strength UKG:URKYHC is symmetrical without fasciculations.   Motor: 5/5 strength to UEs. He can barely lift his thighs off  chair to gravity. He can barely kick out his legs against against gravity. His dorsiflexion/plantar flexion is greatly reduced bilaterally at 4-/5.  Tone is normal. Bulk is normal.  Sensation-upon testing light touch, he has extreme hyperesthesia from his feet, ankles and up to mid calf.  On the top portions of his thigh, he says that-sensation feels as if his muscles are sore. He has a stocking pattern distribution of sensory loss in both lower extremities. Hyperesthesia/allodynia from knees to feet.  Coordination: FTN intact bilaterally.  DTRs: maybe 1+ at biceps and BR. Absent at knees and ankles bilaterally Gait- deferred  Labs I have reviewed labs in epic and the results pertinent to this consultation are: CK 131    CRP  0.8    Vit B12  234    TSH 0.408   Na 129  CBC    Component Value Date/Time   WBC 7.7 01/24/2021 0045   RBC 3.01 (L) 01/24/2021 0045   HGB 10.3 (L) 01/24/2021 0045   HCT 28.7 (L) 01/24/2021 0045   PLT 214 01/24/2021 0045   MCV 95.3 01/24/2021 0045   MCH 34.2 (H) 01/24/2021 0045   MCHC 35.9 01/24/2021  0045   RDW 17.6 (H) 01/24/2021 0045   LYMPHSABS 0.6 (L) 01/24/2021 0045   MONOABS 0.6 01/24/2021 0045   EOSABS 0.0 01/24/2021 0045   BASOSABS 0.0 01/24/2021 0045    CMP     Component Value Date/Time   NA 129 (L) 01/24/2021 0045   K 4.2 01/24/2021 0045   CL 95 (L) 01/24/2021 0045   CO2 22 01/24/2021 0045   GLUCOSE 147 (H) 01/24/2021 0045   BUN 14 01/24/2021 0045   CREATININE 0.85 01/24/2021 0045   CALCIUM 8.3 (L) 01/24/2021 0045   PROT 6.4 (L) 01/24/2021 0045   ALBUMIN 3.3 (L) 01/24/2021 0045   AST 29 01/24/2021 0045   ALT 20 01/24/2021 0045   ALKPHOS 65 01/24/2021 0045   BILITOT 1.2 01/24/2021 0045   GFRNONAA >60 01/24/2021 0045   GFRAA >60 11/02/2019 2036    Assessment: 35 yo male with a significant history of ETOH abuse who came in with BRBPR and leg weakness which was ascending in nature with pain and allodynia.. His presentation and ascending nature of symptoms, along with no reflexes in the lower extremities, are suspicious for Guillian-Barre. He has no back pain which is not the usual picture with GBS, but her does have hyperesthesias and allodynia.  NP discussed LP for diagnostic purposes and he is agreeable if we need to perform.   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 -Work-up for other causes of neuropathy as below  Recommendations: -LP to look for albumino-cytological dissociation -B12 is low normal-parenteral replenishing recommended -Hyponatremia likely due to chronic alcoholism.  Management per primary team. -Check A1c, folate.  Hepatitis panel as above.  Unlikely to be cryoglobulinemia as he is hepatitis C negative. Will order  SPEP, UPEP as well. -Amenable to LP. Will perform at bedside this afternoon.   Pt seen by Jimmye Norman, NP/Neuro and later by MD. Note/plan to be edited by MD as needed.  Pager: [phone]   Attending Neurohospitalist Addendum Patient seen and examined with APP/Resident. Agree with the history and physical as documented above. Agree with the plan as documented,  which I helped formulate. I have independently reviewed the chart, obtained history, review of systems and examined the patient.I have personally reviewed pertinent head/neck/spine imaging (CT/MRI). Discussed with Dr. Jerral Ralph on securechat. Please feel free to call with any questions.  -- Milon Dikes, MD Neurologist [facility] Pager: [phone]

## 2021-01-25 ENCOUNTER — Inpatient Hospital Stay (HOSPITAL_COMMUNITY): Payer: Self-pay

## 2021-01-25 LAB — COMPREHENSIVE METABOLIC PANEL
ALT: 19 U/L (ref 0–44)
AST: 30 U/L (ref 15–41)
Albumin: 3.5 g/dL (ref 3.5–5.0)
Alkaline Phosphatase: 78 U/L (ref 38–126)
Anion gap: 11 (ref 5–15)
BUN: 18 mg/dL (ref 6–20)
CO2: 25 mmol/L (ref 22–32)
Calcium: 8.6 mg/dL — ABNORMAL LOW (ref 8.9–10.3)
Chloride: 97 mmol/L — ABNORMAL LOW (ref 98–111)
Creatinine, Ser: 0.8 mg/dL (ref 0.61–1.24)
GFR, Estimated: >60 mL/min (ref >60)
Glucose, Bld: 103 mg/dL — ABNORMAL HIGH (ref 70–99)
Potassium: 4.1 mmol/L (ref 3.5–5.1)
Sodium: 133 mmol/L — ABNORMAL LOW (ref 135–145)
Total Bilirubin: 1 mg/dL (ref 0.3–1.2)
Total Protein: 6.5 g/dL (ref 6.5–8.1)

## 2021-01-25 LAB — CBC WITH DIFFERENTIAL/PLATELET
Abs Immature Granulocytes: 0.11 10*3/uL — ABNORMAL HIGH (ref 0.00–0.07)
Basophils Absolute: 0 10*3/uL (ref 0.0–0.1)
Basophils Relative: 0 %
Eosinophils Absolute: 0 10*3/uL (ref 0.0–0.5)
Eosinophils Relative: 0 %
HCT: 32.2 % — ABNORMAL LOW (ref 39.0–52.0)
Hemoglobin: 10.7 g/dL — ABNORMAL LOW (ref 13.0–17.0)
Immature Granulocytes: 1 %
Lymphocytes Relative: 10 %
Lymphs Abs: 0.8 10*3/uL (ref 0.7–4.0)
MCH: 33 pg (ref 26.0–34.0)
MCHC: 33.2 g/dL (ref 30.0–36.0)
MCV: 99.4 fL (ref 80.0–100.0)
Monocytes Absolute: 1.1 10*3/uL — ABNORMAL HIGH (ref 0.1–1.0)
Monocytes Relative: 14 %
Neutro Abs: 5.9 10*3/uL (ref 1.7–7.7)
Neutrophils Relative %: 75 %
Platelets: 238 10*3/uL (ref 150–400)
RBC: 3.24 MIL/uL — ABNORMAL LOW (ref 4.22–5.81)
RDW: 17.8 % — ABNORMAL HIGH (ref 11.5–15.5)
WBC: 7.9 10*3/uL (ref 4.0–10.5)
nRBC: 0 % (ref 0.0–0.2)

## 2021-01-25 LAB — T4, FREE: Free T4: 0.97 ng/dL (ref 0.61–1.12)

## 2021-01-25 LAB — ANTINUCLEAR ANTIBODIES, IFA: ANA Ab, IFA: NEGATIVE

## 2021-01-25 LAB — C-REACTIVE PROTEIN: CRP: 0.6 mg/dL (ref <1.0)

## 2021-01-25 LAB — MAGNESIUM: Magnesium: 2.3 mg/dL (ref 1.7–2.4)

## 2021-01-25 LAB — IGG: IgG (Immunoglobin G), Serum: 1057 mg/dL (ref 603–1613)

## 2021-01-25 LAB — D-DIMER, QUANTITATIVE: D-Dimer, Quant: 0.32 ug/mL-FEU (ref 0.00–0.50)

## 2021-01-25 LAB — CERULOPLASMIN: Ceruloplasmin: 18.5 mg/dL (ref 16.0–31.0)

## 2021-01-25 LAB — TSH: TSH: 1.491 u[IU]/mL (ref 0.350–4.500)

## 2021-01-25 LAB — SEDIMENTATION RATE: Sed Rate: 8 mm/hr (ref 0–16)

## 2021-01-25 LAB — FERRITIN: Ferritin: 218 ng/mL (ref 24–336)

## 2021-01-25 LAB — ANTI-SMOOTH MUSCLE ANTIBODY, IGG: F-Actin IgG: 12 Units (ref 0–19)

## 2021-01-25 LAB — PHOSPHORUS: Phosphorus: 4.2 mg/dL (ref 2.5–4.6)

## 2021-01-25 MED ORDER — GABAPENTIN 400 MG PO CAPS
400.0000 mg | ORAL_CAPSULE | Freq: Three times a day (TID) | ORAL | Status: DC
  Administered 2021-01-25 – 2021-01-26 (×3): 400 mg via ORAL
  Filled 2021-01-25 (×3): qty 1

## 2021-01-25 MED ORDER — LIDOCAINE-EPINEPHRINE-TETRACAINE (LET) TOPICAL GEL
3.0000 mL | Freq: Two times a day (BID) | TOPICAL | Status: DC
  Administered 2021-01-25: 3 mL via TOPICAL
  Filled 2021-01-25 (×4): qty 3

## 2021-01-25 MED ORDER — CAPSAICIN 0.025 % EX CREA
TOPICAL_CREAM | Freq: Two times a day (BID) | CUTANEOUS | Status: DC
  Filled 2021-01-25: qty 60

## 2021-01-25 MED ORDER — GADOBUTROL 1 MMOL/ML IV SOLN
9.0000 mL | Freq: Once | INTRAVENOUS | Status: AC | PRN
  Administered 2021-01-25: 9 mL via INTRAVENOUS

## 2021-01-25 NOTE — Progress Notes (Signed)
OT Cancellation Note  Patient Details Name: Shane Holmes MRN: [id number] DOB: Aug 30, 1986   Cancelled Treatment:    Reason Eval/Treat Not Completed: Pt refusing activity with OT this am insisting he has to do his leg stretches right now.  He is also insistent he doesn't have COVID.  Will reattempt as schedule allow.  Eber Jones., OTR/L Acute Rehabilitation Services Pager [phone] Office [phone]   Jeani Hawking M 01/25/2021, 10:10 AM

## 2021-01-25 NOTE — Progress Notes (Signed)
PROGRESS NOTE                                                                                                                                                                                                             Patient Demographics:    Shane Holmes, is a 35 y.o. male, DOB - 06/07/1986, WUJ:[id number]  Outpatient Primary MD for the patient is Patient, No Pcp Per   Admit date - 01/22/2021   LOS - 2  Chief Complaint  Patient presents with  . GI Bleeding    N/v        Brief Narrative: Patient is a 35 y.o. male with PMHx of alcohol use, asthma-presenting to the hospital with lower GI bleeding, asthma exacerbation-and lower extremity weakness/neuropathic pain.  See below for further details  COVID-19 vaccinated status: Unvaccinated  Significant Events: 2/21>> Admit to [facility] for lower GI bleed/asthma exacerbation/Covid infection  Significant studies: 2/21>>Chest x-ray: No pneumonia 2/24>> MRI LS spine: Small-2 cm area of soft tissue inflammation in the left erector spinous muscle at L4-L5.  No inflammatory process in the lumbar spine  COVID-19 medications: Remdesivir: 2/21>>  Antibiotics: None  Microbiology data: None  Procedures: 2/23>> lumbar puncture  Consults: Neurology, GI  DVT prophylaxis: SCDs Start: 01/22/21 2053    Subjective:   Breathing is better-hematochezia has essentially resolved-lower extremity weakness/neuropathic pain is essentially unchanged.   Assessment  & Plan :   Hematochezia: Suspicion for rectal fissure-bleeding has almost resolved this morning-remains on MiraLAX and lidocaine gel.  GI recommending outpatient endoscopic evaluation.  Hemoglobin remained stable.  Acute blood loss anemia: Secondary to above-hemoglobin stable-follow CBC periodically.  EtOH abuse: No signs of DTs-on Ativan per CIWA protocol.  Counseled extensively-claims he is completely abstaining from alcohol  post hospital discharge.   Asthma exacerbation: Much improved-continue bronchodilators-continue to taper steroids.  COVID-19 infection: No hypoxia-no pneumonia on imaging-Remdesivir x3 days.  Fever: afebrile O2 requirements:  SpO2: 98 %   COVID-19 Labs: Recent Labs    01/22/21 2221 01/23/21 0136 01/24/21 0045 01/25/21 0217  DDIMER <0.27 <0.27 0.27 0.32  FERRITIN 229 231 196 218  LDH 185  --   --   --   CRP 0.6 0.6 0.8 0.6    No results found for: BNP  Recent Labs  Lab 01/22/21 2221  PROCALCITON 0.27    Lab Results  Component Value Date   SARSCOV2NAA POSITIVE (A) 01/22/2021    Hypokalemia: Secondary to EtOH use-repleted.  Follow.  Hyponatremia: Likely due to beer potomania-sodium slowly improving.  Continue supportive care and close follow-up.   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  Neurology following-await further recommendations.   Soft tissue inflammation/myositis in the left erector spinous muscles L4-L5-seen on MRI:?  Sequelae of recent LP-not sure if any clinical significance.  Physical exam is unremarkable.  Already on steroids for bronchial asthma exacerbation.  Prior hepatitis B exposure: Hepatitis B serology consistent with immunity.  HCV antibody negative as well.  Tobacco abuse: Counseled-interested in quitting-on transdermal nicotine.  GI prophylaxis: PPI  ABG: No results found for: PHART, PCO2ART, PO2ART, HCO3, TCO2, ACIDBASEDEF, O2SAT  Vent Settings: N/A    Condition - Stable  Family Communication  : None at bedside-we will update family over the next day or so.  Code Status :  Full Code  Diet :  Diet Order            Diet Carb Modified Fluid consistency: Thin; Room service appropriate? Yes  Diet effective now                  Disposition Plan  :    Status is: Inpatient  The patient will require care spanning > 2 midnights and should be moved to inpatient because: Inpatient level of care appropriate due to severity of illness  Dispo: The patient is from: Home              Anticipated d/c is to: Home              Anticipated d/c date is: 2 days              Patient currently is not medically stable to d/c.   Difficult to place patient No  Barriers to discharge: Ongoing GI bleeding--alcohol withdrawal-asthma exacerbation requiring IV steroids-lower extremity weakness/pain-needing inpatient evaluation and work-up.  Antimicorbials  :    Anti-infectives (From admission, onward)   Start     Dose/Rate Route Frequency Ordered Stop   01/23/21 1000  remdesivir 100 mg in sodium chloride 0.9 % 100 mL IVPB       "Followed by" Linked Group Details   100 mg 200 mL/hr over 30 Minutes Intravenous Daily 01/22/21 2004 01/27/21 0959   01/22/21 2015  remdesivir 200 mg in sodium chloride 0.9% 250 mL IVPB       "Followed by" Linked Group Details   200 mg 580 mL/hr over 30 Minutes Intravenous Once 01/22/21 2004 01/22/21 2344      Inpatient Medications  Scheduled Meds: . albuterol  2 puff Inhalation Q6H  . vitamin C  500 mg Oral Daily  . capsaicin   Topical BID  . cyanocobalamin  1,000 mcg Subcutaneous Daily  . gabapentin  400 mg Oral TID  . lidocaine-EPINEPHrine-tetracaine  3 mL Topical BID  . LORazepam  0-4 mg Intravenous Q12H   Or  . LORazepam  0-4 mg Oral Q12H  . multivitamin with minerals  1 tablet Oral Daily  . nicotine  21 mg Transdermal Daily  . pantoprazole  40 mg Oral Q0600  . polyethylene glycol  17 g Oral Daily  . predniSONE  40 mg Oral Q breakfast  . sodium chloride flush  3 mL Intravenous Q12H  . thiamine  100 mg Oral Daily  .  zinc sulfate  220 mg Oral Daily   Continuous Infusions: . remdesivir 100 mg in NS 100 mL 100 mg (01/25/21 0815)   PRN Meds:.acetaminophen, albuterol, chlorpheniramine-HYDROcodone, ondansetron  **OR** ondansetron (ZOFRAN) IV, oxyCODONE   Time Spent in minutes  25   See all Orders from today for further details   Jeoffrey Massed M.D on 01/25/2021 at 2:37 PM  To page go to [email or web address] - use universal password  [facility] -  Office  [phone]    Objective:   Vitals:   01/25/21 0000 01/25/21 0356 01/25/21 0804 01/25/21 1230  BP: 121/85 130/90 (!) 138/95 (!) 130/92  Pulse: (!) 109 (!) 106 97 90  Resp: 20 20 19 18   Temp: 98.2 F (36.8 C) 98 F (36.7 C) 98.2 F (36.8 C) 98.6 F (37 C)  TempSrc: Oral Oral Oral Oral  SpO2: 95% 95% 99% 98%    Wt Readings from Last 3 Encounters:  09/20/20 87.1 kg  11/02/19 90.7 kg  02/02/19 86.2 kg     Intake/Output Summary (Last 24 hours) at 01/25/2021 1437 Last data filed at 01/25/2021 1356 Gross per 24 hour  Intake 1720.44 ml  Output 675 ml  Net 1045.44 ml     Physical Exam Gen Exam:Alert awake-not in any distress HEENT:atraumatic, normocephalic Chest: B/L clear to auscultation anteriorly CVS:S1S2 regular Abdomen:soft non tender, non distended Extremities:no edema Neurology: Still with lower extremity weakness-just about able to raise both legs off the bed. Skin: no rash   Data Review:    CBC Recent Labs  Lab 01/23/21 0136 01/23/21 0828 01/23/21 1501 01/24/21 0045 01/25/21 0217  WBC 5.8 5.4 6.1 7.7 7.9  HGB 10.9* 10.6* 10.9* 10.3* 10.7*  HCT 30.7* 30.9* 32.0* 28.7* 32.2*  PLT 165 163 182 214 238  MCV 94.2 95.7 97.3 95.3 99.4  MCH 33.4 32.8 33.1 34.2* 33.0  MCHC 35.5 34.3 34.1 35.9 33.2  RDW 17.3* 17.2* 17.3* 17.6* 17.8*  LYMPHSABS 0.6*  --   --  0.6* 0.8  MONOABS 0.1  --   --  0.6 1.1*  EOSABS 0.0  --   --  0.0 0.0  BASOSABS 0.0  --   --  0.0 0.0    Chemistries  Recent Labs  Lab 01/22/21 1217 01/22/21 1533 01/22/21 2221 01/23/21 0136 01/24/21 0045 01/25/21 0217  NA 130*  --  128* 127* 129* 133*  K 2.9*  --  2.8* 3.4* 4.2 4.1  CL 92*  --  92* 94* 95* 97*  CO2 26  --  25 25 22 25    GLUCOSE 150*  --  111* 199* 147* 103*  BUN 5*  --  8 8 14 18   CREATININE 0.79  --  0.94 0.90 0.85 0.80  CALCIUM 8.6*  --  8.3* 8.2* 8.3* 8.6*  MG  --  2.0  --  2.0 2.0 2.3  AST 50*  --   --  40 29 30  ALT 25  --   --  24 20 19   ALKPHOS 62  --   --  59 65 78  BILITOT 2.0*  --   --  1.4* 1.2 1.0   ------------------------------------------------------------------------------------------------------------------ No results for input(s): CHOL, HDL, LDLCALC, TRIG, CHOLHDL, LDLDIRECT in the last 72 hours.  Lab Results  Component Value Date   HGBA1C 6.0 (H) 01/24/2021   ------------------------------------------------------------------------------------------------------------------ Recent Labs    01/25/21 1107  TSH 1.491   ------------------------------------------------------------------------------------------------------------------ Recent Labs    01/23/21 1108 01/24/21 0045 01/24/21 1455 01/25/21 0217  VITAMINB12 234  --   --   --  FOLATE  --   --  4.0*  --   FERRITIN  --  196  --  218    Coagulation profile Recent Labs  Lab 01/22/21 1533  INR 1.1    Recent Labs    01/24/21 0045 01/25/21 0217  DDIMER 0.27 0.32    Cardiac Enzymes No results for input(s): CKMB, TROPONINI, MYOGLOBIN in the last 168 hours.  Invalid input(s): CK ------------------------------------------------------------------------------------------------------------------ No results found for: BNP  Micro Results Recent Results (from the past 240 hour(s))  Resp Panel by RT-PCR (Flu A&B, Covid) Nasopharyngeal Swab     Status: Abnormal   Collection Time: 01/22/21  3:41 PM   Specimen: Nasopharyngeal Swab; Nasopharyngeal(NP) swabs in vial transport medium  Result Value Ref Range Status   SARS Coronavirus 2 by RT PCR POSITIVE (A) NEGATIVE Final    Comment: RESULT CALLED TO, READ BACK BY AND VERIFIED WITH: Keitha Butte RN 1725 01/22/21 A BROWNING (NOTE) SARS-CoV-2 target nucleic acids are  DETECTED.  The SARS-CoV-2 RNA is generally detectable in upper respiratory specimens during the acute phase of infection. Positive results are indicative of the presence of the identified virus, but do not rule out bacterial infection or co-infection with other pathogens not detected by the test. Clinical correlation with patient history and other diagnostic information is necessary to determine patient infection status. The expected result is Negative.  Fact Sheet for Patients: BloggerCourse.com  Fact Sheet for Healthcare Providers: SeriousBroker.it  This test is not yet approved or cleared by the [place] FDA and  has been authorized for detection and/or diagnosis of SARS-CoV-2 by FDA under an Emergency Use Authorization (EUA).  This EUA will remain in effect (meaning this test can  be used) for the duration of  the COVID-19 declaration under Section 564(b)(1) of the Act, 21 U.S.C. section 360bbb-3(b)(1), unless the authorization is terminated or revoked sooner.     Influenza A by PCR NEGATIVE NEGATIVE Final   Influenza B by PCR NEGATIVE NEGATIVE Final    Comment: (NOTE) The Xpert Xpress SARS-CoV-2/FLU/RSV plus assay is intended as an aid in the diagnosis of influenza from Nasopharyngeal swab specimens and should not be used as a sole basis for treatment. Nasal washings and aspirates are unacceptable for Xpert Xpress SARS-CoV-2/FLU/RSV testing.  Fact Sheet for Patients: BloggerCourse.com  Fact Sheet for Healthcare Providers: SeriousBroker.it  This test is not yet approved or cleared by the [place] FDA and has been authorized for detection and/or diagnosis of SARS-CoV-2 by FDA under an Emergency Use Authorization (EUA). This EUA will remain in effect (meaning this test can be used) for the duration of the COVID-19 declaration under Section 564(b)(1) of the Act, 21  U.S.C. section 360bbb-3(b)(1), unless the authorization is terminated or revoked.  Performed at [facility], [address], [place], [place] [address]   CSF culture w Gram Stain     Status: None (Preliminary result)   Collection Time: 01/24/21  4:21 PM   Specimen: CSF; Cerebrospinal Fluid  Result Value Ref Range Status   Specimen Description CSF  Final   Special Requests Normal  Final   Gram Stain   Final    WBC PRESENT, PREDOMINANTLY MONONUCLEAR NO ORGANISMS SEEN CYTOSPIN SMEAR    Culture   Final    NO GROWTH < 24 HOURS Performed at [facility], [address] [place], [place] [address]    Report Status PENDING  Incomplete    Radiology Reports MR Lumbar Spine W Wo Contrast  Result Date: 01/25/2021 CLINICAL DATA:  35 year old male with lower GI bleed. Alcohol withdrawal syndrome. COVID-19. Lower extremity weakness, neuropathic pain. EXAM: MRI LUMBAR SPINE WITHOUT AND WITH CONTRAST TECHNIQUE: Multiplanar and multiecho pulse sequences of the lumbar spine were obtained without and with intravenous contrast. CONTRAST:  9mL GADAVIST GADOBUTROL 1 MMOL/ML IV SOLN COMPARISON:  Lumbar MRI 02/02/2009. CT Chest, Abdomen, and Pelvis 11/03/2019. FINDINGS: Segmentation:  Normal on the 2020 CT. Alignment: Normal lumbar lordosis. Stable vertebral height and alignment since 2010. Vertebrae: No marrow edema or evidence of acute osseous abnormality. Visualized bone marrow signal is within normal limits. Intact visible sacrum and SI joints. 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 No associated spondylolisthesis at that level. See additional facet details below. Conus medullaris and cauda equina: Conus extends to the T12-L1 level. No lower spinal cord or conus signal abnormality. Small fatty filum terminalis (series 9, image 27, normal variant). Cauda equina nerve roots appear normal. No abnormal intradural enhancement or dural thickening. Paraspinal and other soft  tissues: Small area of patchy abnormal STIR signal and subtle soft tissue enhancement in the deep left paraspinal erector spinae muscles adjacent to the L4-L5 posterior elements (series 6, image 11 and series 10, image 31). No fluid collection, and the other bilateral paraspinal muscles remain normal. This area was normal on the 2010 MRI. No underlying acute osseous abnormality. Negative visible abdominal viscera. Disc levels: T11-T12: Partially visible and negative. T12-L1:  Negative. L1-L2:  Negative. L2-L3:  Negative. L3-L4:  Negative. L4-L5: Negative disc. Mild to moderate facet and ligament flavum hypertrophy. Trace degenerative facet joint fluid on the left, which is in proximity to the small area of abnormal paraspinal soft tissue described earlier. But there is no marrow edema in the facets. No stenosis. L5-S1: Negative disc. Mild facet hypertrophy with trace degenerative facet joint fluid. No stenosis. IMPRESSION: 1. Small 2 cm area of soft tissue inflammation/myositis in the left erector spinous muscle at L4-L5. There is regional chronic L4-L5 facet degeneration (see #2) but no associated osseous abnormality and no other acute or inflammatory process in the lumbar spine. 2. 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 But normal lumbar discs with no spinal stenosis or neural impingement. Electronically Signed   By: Odessa Fleming M.D.   On: 01/25/2021 08:53   DG Chest Portable 1 View  Result Date: 01/22/2021 CLINICAL DATA:  Cough EXAM: PORTABLE CHEST 1 VIEW COMPARISON:  11/02/2019 FINDINGS: The heart size and mediastinal contours are within normal limits. Both lungs are clear. Subacute to chronic appearing bilateral lower rib fractures. IMPRESSION: No active disease. Electronically Signed   By: Jasmine Pang M.D.   On: 01/22/2021 15:45   VAS [place] LOWER EXTREMITY VENOUS (DVT)  Result Date: 01/23/2021  Lower Venous DVT Study Indications: Edema.  Risk Factors: Covid+. Comparison Study: No  previous exams Performing Technologist: Ernestene Mention  Examination Guidelines: A complete evaluation includes B-mode imaging, spectral Doppler, color Doppler, and power Doppler as needed of all accessible portions of each vessel. Bilateral testing is considered an integral part of a complete examination. Limited examinations for reoccurring indications may be performed as noted. The reflux portion of the exam is performed with the patient in reverse Trendelenburg.  +---------+---------------+---------+-----------+----------+--------------+ RIGHT    CompressibilityPhasicitySpontaneityPropertiesThrombus Aging +---------+---------------+---------+-----------+----------+--------------+ CFV      Full           Yes      Yes                                 +---------+---------------+---------+-----------+----------+--------------+  SFJ      Full                                                        +---------+---------------+---------+-----------+----------+--------------+ FV Prox  Full           Yes      Yes                                 +---------+---------------+---------+-----------+----------+--------------+ FV Mid   Full           Yes      Yes                                 +---------+---------------+---------+-----------+----------+--------------+ FV DistalFull           Yes      Yes                                 +---------+---------------+---------+-----------+----------+--------------+ PFV      Full                                                        +---------+---------------+---------+-----------+----------+--------------+ POP      Full           Yes      Yes                                 +---------+---------------+---------+-----------+----------+--------------+ PTV      Full                                                        +---------+---------------+---------+-----------+----------+--------------+ PERO     Full                                                         +---------+---------------+---------+-----------+----------+--------------+   +---------+---------------+---------+-----------+----------+--------------+ LEFT     CompressibilityPhasicitySpontaneityPropertiesThrombus Aging +---------+---------------+---------+-----------+----------+--------------+ CFV      Full           Yes      Yes                                 +---------+---------------+---------+-----------+----------+--------------+ SFJ      Full                                                        +---------+---------------+---------+-----------+----------+--------------+  FV Prox  Full           Yes      Yes                                 +---------+---------------+---------+-----------+----------+--------------+ FV Mid   Full           Yes      Yes                                 +---------+---------------+---------+-----------+----------+--------------+ FV DistalFull           Yes      Yes                                 +---------+---------------+---------+-----------+----------+--------------+ PFV      Full                                                        +---------+---------------+---------+-----------+----------+--------------+ POP      Full           Yes      Yes                                 +---------+---------------+---------+-----------+----------+--------------+ PTV      Full                                                        +---------+---------------+---------+-----------+----------+--------------+ PERO     Full                                                        +---------+---------------+---------+-----------+----------+--------------+     Summary: BILATERAL: - No evidence of deep vein thrombosis seen in the lower extremities, bilaterally. - No evidence of superficial venous thrombosis in the lower extremities, bilaterally. -No evidence of popliteal cyst, bilaterally.    *See table(s) above for measurements and observations. Electronically signed by Waverly Ferrari MD on 01/23/2021 at 6:26:32 PM.    Final

## 2021-01-25 NOTE — Progress Notes (Signed)
Neurology Progress Note  S: No overnight events. Patient says he is no better, but no worse. Then, he states his pain in his thighs has resolved and he can swing his LEs when sitting up in chair. He really wants to leave and is motivated to do all he can with PT to get out of here. NP explained to patient that he may need rehab even if he leaves the hospital.   O: Current vital signs: BP (!) 138/95 (BP Location: Left Arm)   Pulse 97   Temp 98.2 F (36.8 C) (Oral)   Resp 19   SpO2 99%  Vital signs in last 24 hours: Temp:  [98 F (36.7 C)-98.2 F (36.8 C)] 98.2 F (36.8 C) (02/24 0804) Pulse Rate:  [96-109] 97 (02/24 0804) Resp:  [15-20] 19 (02/24 0804) BP: (116-138)/(85-95) 138/95 (02/24 0804) SpO2:  [93 %-99 %] 99 % (02/24 0804)  GENERAL: Awake, alert in NAD HEENT: Normocephalic and atraumatic LUNGS: Normal respiratory effort.  CV: RRR Ext: warm   NEURO:  Mental Status: AA&Ox3  Speech/Language: speech is without aphasia or dysarthria.  Naming, repetition, fluency, and comprehension intact.  Cranial Nerves:  III, IV, VI: EOMI. Eyelids elevate symmetrically.  V: Sensation is intact to light touch and symmetrical to face.  VII: Smile is symmetrical.  VIII: hearing intact to voice. IX, X: Phonation is normal.  SH:FWYOVZCH shrug 5/5. Motor: 5/5 to BUEs. 5/5 to Bilateral thighs and with kicking out with his lower legs. 4/5 strength to LLE plantar and dorsiflexion. 4+/5 to right dorsiflexion and plantar flexion.  Tone is normal and bulk is normal Sensation- Intact to light touch bilaterally. Extinction absent to light touch to DSS.    Coordination: FTN intact bilaterally, HKS: no ataxia in BLE. No drift.  DTRs: 0+ LEs. 2+ UEs.   Medications  Current Facility-Administered Medications:  .  acetaminophen (TYLENOL) tablet 650 mg, 650 mg, Oral, Q6H PRN, Doutova, Anastassia, MD, 650 mg at 01/23/21 0514 .  albuterol (VENTOLIN HFA) 108 (90 Base) MCG/ACT inhaler 2 puff, 2 puff,  Inhalation, Q6H, Doutova, Anastassia, MD, 2 puff at 01/25/21 0812 .  albuterol (VENTOLIN HFA) 108 (90 Base) MCG/ACT inhaler 2 puff, 2 puff, Inhalation, Q4H PRN, Doutova, Anastassia, MD, 2 puff at 01/24/21 0549 .  ascorbic acid (VITAMIN C) tablet 500 mg, 500 mg, Oral, Daily, Doutova, Anastassia, MD, 500 mg at 01/25/21 8850 .  capsaicin (ZOSTRIX) 0.025 % cream, , Topical, BID, Kirby-Graham, Karsten Fells, NP .  chlorpheniramine-HYDROcodone (TUSSIONEX) 10-8 MG/5ML suspension 5 mL, 5 mL, Oral, Q12H PRN, Doutova, Anastassia, MD .  cyanocobalamin ((VITAMIN B-12)) injection 1,000 mcg, 1,000 mcg, Subcutaneous, Daily, Ghimire, Henreitta Leber, MD, 1,000 mcg at 01/25/21 0824 .  gabapentin (NEURONTIN) tablet 400 mg, 400 mg, Oral, TID, Kirby-Graham, Karsten Fells, NP .  lidocaine-EPINEPHrine-tetracaine (LET) topical gel, 3 mL, Topical, BID, Kirby-Graham, Karsten Fells, NP .  LORazepam (ATIVAN) injection 0-4 mg, 0-4 mg, Intravenous, Q12H **OR** LORazepam (ATIVAN) tablet 0-4 mg, 0-4 mg, Oral, Q12H, Doutova, Anastassia, MD .  multivitamin with minerals tablet 1 tablet, 1 tablet, Oral, Daily, Doutova, Anastassia, MD, 1 tablet at 01/25/21 0811 .  nicotine (NICODERM CQ - dosed in mg/24 hours) patch 21 mg, 21 mg, Transdermal, Daily, Doutova, Anastassia, MD, 21 mg at 01/25/21 0825 .  ondansetron (ZOFRAN) tablet 4 mg, 4 mg, Oral, Q6H PRN **OR** ondansetron (ZOFRAN) injection 4 mg, 4 mg, Intravenous, Q6H PRN, Doutova, Anastassia, MD .  oxyCODONE (Oxy IR/ROXICODONE) immediate release tablet 10 mg, 10 mg, Oral, Q4H PRN,  Jonetta Osgood, MD, 10 mg at 01/25/21 [phone] .  pantoprazole (PROTONIX) EC tablet 40 mg, 40 mg, Oral, Q0600, Vena Rua, PA-C, 40 mg at 01/25/21 0654 .  polyethylene glycol (MIRALAX / GLYCOLAX) packet 17 g, 17 g, Oral, Daily, Gribbin, Sarah J, PA-C .  predniSONE (DELTASONE) tablet 40 mg, 40 mg, Oral, Q breakfast, Ghimire, Henreitta Leber, MD, 40 mg at 01/25/21 0718 .  [COMPLETED] remdesivir 200 mg in sodium chloride 0.9% 250 mL  IVPB, 200 mg, Intravenous, Once, Stopped at 01/22/21 2344 **FOLLOWED BY** remdesivir 100 mg in sodium chloride 0.9 % 100 mL IVPB, 100 mg, Intravenous, Daily, Doutova, Anastassia, MD, Last Rate: 200 mL/hr at 01/25/21 0815, 100 mg at 01/25/21 0815 .  sodium chloride flush (NS) 0.9 % injection 3 mL, 3 mL, Intravenous, Q12H, Doutova, Anastassia, MD, 3 mL at 01/25/21 0825 .  thiamine tablet 100 mg, 100 mg, Oral, Daily, 100 mg at 01/25/21 3500 **OR** [DISCONTINUED] thiamine (B-1) injection 100 mg, 100 mg, Intravenous, Daily, Doutova, Anastassia, MD .  zinc sulfate capsule 220 mg, 220 mg, Oral, Daily, Doutova, Anastassia, MD, 220 mg at 01/25/21 0811  Pertinent Labs CSF testing normal. No findings consistent with GBS.   Imaging MD has reviewed images in epic and the results pertinent to this consultation are:  MRI spine 1. Small 2 cm area of soft tissue inflammation/myositis in the left erector spinous muscle at L4-L5. There is regional chronic L4-L5 facet degeneration (see #2) but no associated osseous abnormality and no other acute or inflammatory process in the lumbar spine. 2. 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 But normal lumbar discs with no spinal stenosis or neural impingement.   Assessment : 35 yo male with a presentation resembling GBS, however his CSF testing is not consistent with this. He may have some other type of neuropathy and we are getting additional testing today. Discussed with patient that we may not be able to put a label on this, and it may turn out to just be ETOH neuropathy. MRI with myopathy vs inflammation in a localized area at L4-5-possibly secondary to the LP that he had had just before MRI.  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 Increase Gabapentin to 467m po  tid and watch for drowsiness. 2. Try lidocaine cream with a capsaicin layer over that BID.  3. Continue PT/OT 4. Further labs include: heavy metals serum and urine, thyroid function tests, ANA, ESR, urine/blood for porphyrins, RA, Sjogren testing, Anti Hu antibodies, Methylmalonic acid, homocysteine levels. Also [facility] send outs-antimyelin associated glycoprotein and Anti GM1 antibodies.  5. Continue B12 and Thiamine repletion.  6. Will not initiate IVIG  Pt seen by KClance Boll MSN, APN-BC/Nurse Practitioner/Neuro and later by MD. Note and plan to be edited as needed by MD.  Pager: [phone] Attending Neurohospitalist Addendum Patient d/w APP/Resident. Agree with the history and physical as documented above. Agree with the plan as documented, which I helped formulate. I have independently reviewed the chart, obtained history, review of systems and examined the patient.I have personally reviewed pertinent head/neck/spine imaging (CT/MRI). Plan d/w Dr. GSloan LeiterPlease feel free to call with any questions. --- AAmie Portland MD [facility] Pager: [phone] If 7pm to 7am, please call on call as listed on AMION.

## 2021-01-25 NOTE — Plan of Care (Signed)
  Problem: Respiratory: Goal: Will maintain a patent airway Outcome: Progressing   Problem: Health Behavior/Discharge Planning: Goal: Ability to manage health-related needs will improve Outcome: Progressing   Problem: Clinical Measurements: Goal: Diagnostic test results will improve Outcome: Progressing   Problem: Activity: Goal: Risk for activity intolerance will decrease Outcome: Progressing   Problem: Coping: Goal: Level of anxiety will decrease Outcome: Progressing   Problem: Pain Managment: Goal: General experience of comfort will improve Outcome: Progressing   Problem: Safety: Goal: Ability to remain free from injury will improve Outcome: Progressing

## 2021-01-26 ENCOUNTER — Other Ambulatory Visit: Payer: Self-pay | Admitting: Internal Medicine

## 2021-01-26 DIAGNOSIS — G609 Hereditary and idiopathic neuropathy, unspecified: Secondary | ICD-10-CM

## 2021-01-26 DIAGNOSIS — R29898 Other symptoms and signs involving the musculoskeletal system: Secondary | ICD-10-CM

## 2021-01-26 LAB — PROTEIN ELECTROPHORESIS, SERUM
A/G Ratio: 1.2 (ref 0.7–1.7)
Albumin ELP: 3.7 g/dL (ref 2.9–4.4)
Alpha-1-Globulin: 0.3 g/dL (ref 0.0–0.4)
Alpha-2-Globulin: 0.6 g/dL (ref 0.4–1.0)
Beta Globulin: 1 g/dL (ref 0.7–1.3)
Gamma Globulin: 1.2 g/dL (ref 0.4–1.8)
Globulin, Total: 3.1 g/dL (ref 2.2–3.9)
Total Protein ELP: 6.8 g/dL (ref 6.0–8.5)

## 2021-01-26 LAB — CBC WITH DIFFERENTIAL/PLATELET
Abs Immature Granulocytes: 0.07 10*3/uL (ref 0.00–0.07)
Basophils Absolute: 0 10*3/uL (ref 0.0–0.1)
Basophils Relative: 0 %
Eosinophils Absolute: 0 10*3/uL (ref 0.0–0.5)
Eosinophils Relative: 0 %
HCT: 29.3 % — ABNORMAL LOW (ref 39.0–52.0)
Hemoglobin: 9.7 g/dL — ABNORMAL LOW (ref 13.0–17.0)
Immature Granulocytes: 1 %
Lymphocytes Relative: 15 %
Lymphs Abs: 1.2 10*3/uL (ref 0.7–4.0)
MCH: 32.4 pg (ref 26.0–34.0)
MCHC: 33.1 g/dL (ref 30.0–36.0)
MCV: 98 fL (ref 80.0–100.0)
Monocytes Absolute: 1.2 10*3/uL — ABNORMAL HIGH (ref 0.1–1.0)
Monocytes Relative: 14 %
Neutro Abs: 5.7 10*3/uL (ref 1.7–7.7)
Neutrophils Relative %: 70 %
Platelets: 238 10*3/uL (ref 150–400)
RBC: 2.99 MIL/uL — ABNORMAL LOW (ref 4.22–5.81)
RDW: 17.6 % — ABNORMAL HIGH (ref 11.5–15.5)
WBC: 8.2 10*3/uL (ref 4.0–10.5)
nRBC: 0 % (ref 0.0–0.2)

## 2021-01-26 LAB — COMPREHENSIVE METABOLIC PANEL
ALT: 26 U/L (ref 0–44)
AST: 55 U/L — ABNORMAL HIGH (ref 15–41)
Albumin: 3.3 g/dL — ABNORMAL LOW (ref 3.5–5.0)
Alkaline Phosphatase: 85 U/L (ref 38–126)
Anion gap: 10 (ref 5–15)
BUN: 19 mg/dL (ref 6–20)
CO2: 24 mmol/L (ref 22–32)
Calcium: 8.3 mg/dL — ABNORMAL LOW (ref 8.9–10.3)
Chloride: 99 mmol/L (ref 98–111)
Creatinine, Ser: 0.77 mg/dL (ref 0.61–1.24)
GFR, Estimated: >60 mL/min (ref >60)
Glucose, Bld: 126 mg/dL — ABNORMAL HIGH (ref 70–99)
Potassium: 3.6 mmol/L (ref 3.5–5.1)
Sodium: 133 mmol/L — ABNORMAL LOW (ref 135–145)
Total Bilirubin: 1 mg/dL (ref 0.3–1.2)
Total Protein: 6 g/dL — ABNORMAL LOW (ref 6.5–8.1)

## 2021-01-26 LAB — HOMOCYSTEINE: Homocysteine: 34.1 umol/L — ABNORMAL HIGH (ref 0.0–14.5)

## 2021-01-26 LAB — RHEUMATOID FACTOR: Rheumatoid fact SerPl-aCnc: <10 IU/mL (ref <14.0)

## 2021-01-26 LAB — PHOSPHORUS: Phosphorus: 4.2 mg/dL (ref 2.5–4.6)

## 2021-01-26 LAB — MISC LABCORP TEST (SEND OUT): Labcorp test code: 505240

## 2021-01-26 LAB — SJOGRENS SYNDROME-B EXTRACTABLE NUCLEAR ANTIBODY: SSB (La) (ENA) Antibody, IgG: <0.2 AI (ref 0.0–0.9)

## 2021-01-26 LAB — D-DIMER, QUANTITATIVE: D-Dimer, Quant: 0.32 ug/mL-FEU (ref 0.00–0.50)

## 2021-01-26 LAB — FERRITIN: Ferritin: 235 ng/mL (ref 24–336)

## 2021-01-26 LAB — SJOGRENS SYNDROME-A EXTRACTABLE NUCLEAR ANTIBODY: SSA (Ro) (ENA) Antibody, IgG: <0.2 AI (ref 0.0–0.9)

## 2021-01-26 LAB — MITOCHONDRIAL ANTIBODIES: Mitochondrial M2 Ab, IgG: <20 Units (ref 0.0–20.0)

## 2021-01-26 LAB — C-REACTIVE PROTEIN: CRP: 1 mg/dL — ABNORMAL HIGH (ref <1.0)

## 2021-01-26 LAB — ANA: Anti Nuclear Antibody (ANA): POSITIVE — AB

## 2021-01-26 LAB — MAGNESIUM: Magnesium: 2 mg/dL (ref 1.7–2.4)

## 2021-01-26 MED ORDER — ALBUTEROL SULFATE HFA 108 (90 BASE) MCG/ACT IN AERS: 2.0000 | INHALATION_SPRAY | Freq: Four times a day (QID) | RESPIRATORY_TRACT | 0 refills | Status: DC | PRN

## 2021-01-26 MED ORDER — VITAMIN B-12 1000 MCG PO TABS: 1000.0000 ug | ORAL_TABLET | Freq: Every day | ORAL | 0 refills | Status: DC

## 2021-01-26 MED ORDER — FOLIC ACID 1 MG PO TABS: 1.0000 mg | ORAL_TABLET | Freq: Every day | ORAL | 0 refills | Status: DC

## 2021-01-26 MED ORDER — ADULT MULTIVITAMIN W/MINERALS CH: 1.0000 | ORAL_TABLET | Freq: Every day | ORAL | 0 refills | Status: DC

## 2021-01-26 MED ORDER — PREDNISONE 20 MG PO TABS
30.0000 mg | ORAL_TABLET | Freq: Every day | ORAL | Status: DC
Start: 2021-01-26 — End: 2021-01-26
  Administered 2021-01-26: 30 mg via ORAL
  Filled 2021-01-26: qty 2

## 2021-01-26 MED ORDER — POLYETHYLENE GLYCOL 3350 17 G PO PACK: 17.0000 g | PACK | Freq: Every day | ORAL | 0 refills | Status: DC

## 2021-01-26 MED ORDER — THIAMINE HCL 100 MG PO TABS: 100.0000 mg | ORAL_TABLET | Freq: Every day | ORAL | 0 refills | Status: DC

## 2021-01-26 MED ORDER — CAPSAICIN 0.025 % EX CREA: TOPICAL_CREAM | Freq: Two times a day (BID) | CUTANEOUS | 0 refills | Status: DC

## 2021-01-26 MED ORDER — GABAPENTIN 400 MG PO CAPS: 400.0000 mg | ORAL_CAPSULE | Freq: Three times a day (TID) | ORAL | 0 refills | Status: DC

## 2021-01-26 MED ORDER — LIDOCAINE-EPINEPHRINE-TETRACAINE (LET) TOPICAL GEL: 3.0000 mL | Freq: Two times a day (BID) | TOPICAL | 0 refills | Status: DC

## 2021-01-26 MED ORDER — FOLIC ACID 1 MG PO TABS: 1.0000 mg | ORAL_TABLET | Freq: Every day | ORAL | Status: DC

## 2021-01-26 MED FILL — CAPSAICIN 0.025% CREAM: 0.025 | 14 days supply | Qty: 60 | Fill #0

## 2021-01-26 MED FILL — ALBUTEROL SULFATE HFA 108 (: 108 (90 BAS | 18 days supply | Qty: 18 | Fill #0

## 2021-01-26 MED FILL — VITAMIN B-1 100 MG TABS: 100 | 30 days supply | Qty: 30 | Fill #0

## 2021-01-26 MED FILL — POLYETHYLENE GLYCOL 3350 PO: 17 | 30 days supply | Qty: 510 | Fill #0

## 2021-01-26 MED FILL — VITAMIN B-12 1000 MCG TABS: 1000 | 30 days supply | Qty: 30 | Fill #0

## 2021-01-26 MED FILL — GABAPENTIN 400 MG CAPSULE: 400 | 30 days supply | Qty: 90 | Fill #0

## 2021-01-26 MED FILL — FOLIC ACID 1 MG TABS: 1 | 30 days supply | Qty: 30 | Fill #0

## 2021-01-26 NOTE — Progress Notes (Signed)
Occupational Therapy Treatment Patient Details Name: Shane Holmes MRN: [id number] DOB: October 06, 1986 Today's Date: 01/26/2021    History of present illness Patient is a 35 y.o. male with PMHx of alcohol use, asthma-presenting to the hospital with several days history of cough/wheezing, bilateral lower extremity pain and hematochezia. 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  He is now able to perform ADLs with supervision, and should quickly progress to mod I.   He is eager to discharge home.   Follow Up Recommendations  No OT follow up;Supervision - Intermittent    Equipment Recommendations  3 in 1 bedside commode;Tub/shower bench    Recommendations for Other Services      Precautions / Restrictions Precautions Precautions: Fall       Mobility Bed Mobility               General bed mobility comments: pt sitting up in chair    Transfers Overall transfer level: Needs assistance Equipment used: None Transfers: Sit to/from Stand;Stand Pivot Transfers Sit to Stand: Supervision Stand pivot transfers: Supervision            Balance Overall balance assessment: Needs assistance Sitting-balance support: Feet supported Sitting balance-Leahy Scale: Good     Standing balance support: During functional activity Standing balance-Leahy Scale: Fair                             ADL either performed or assessed with clinical judgement   ADL Overall ADL's : Needs assistance/impaired Eating/Feeding: Independent   Grooming: Wash/dry face;Wash/dry hands;Oral care;Brushing hair;Supervision/safety;Standing   Upper Body Bathing: Supervision/ safety;Standing    Lower Body Bathing: Supervison/ safety;Sit to/from stand   Upper Body Dressing : Set up;Sitting   Lower Body Dressing: Supervision/safety;Sit to/from stand   Toilet Transfer: Supervision/safety;Ambulation;Comfort height toilet   Toileting- Clothing Manipulation and Hygiene: Supervision/safety;Sit to/from stand       Functional mobility during ADLs: Supervision/safety General ADL Comments: pt reports he showered in standing last night without LOB.  He simulated tub transfer with UE support and supervision     Vision       Perception     Praxis      Cognition Arousal/Alertness: Awake/alert Behavior During Therapy: WFL for tasks assessed/performed Overall Cognitive Status: Within Functional Limits for tasks assessed                                          Exercises     Shoulder Instructions       General Comments Pt is eager to discharge home    Pertinent Vitals/ Pain       Pain Assessment: Faces Faces Pain Scale: Hurts a little bit Pain Location: Proximal musculature of bil LEs Pain Descriptors / Indicators: Aching;Burning;Cramping;Constant Pain Intervention(s): Monitored during session  Home Living                                          Prior Functioning/Environment  Frequency  Min 2X/week        Progress Toward Goals  OT Goals(current goals can now be found in the care plan section)     Acute Rehab OT Goals Patient Stated Goal: to go home  Plan Discharge plan needs to be updated    Co-evaluation                 AM-PAC OT "6 Clicks" Daily Activity     Outcome Measure   Help from another person eating meals?: None Help from another person taking care of personal grooming?: A Little Help from another person toileting, which includes using toliet, bedpan, or urinal?: A Little Help from another person bathing (including washing, rinsing, drying)?: A Little Help from another person to  put on and taking off regular upper body clothing?: A Little Help from another person to put on and taking off regular lower body clothing?: A Little 6 Click Score: 19    End of Session Equipment Utilized During Treatment: Gait belt  OT Visit Diagnosis: Unsteadiness on feet (R26.81)   Activity Tolerance Patient tolerated treatment well   Patient Left in chair;with call bell/phone within reach   Nurse Communication Mobility status        Time: 9628-3662 OT Time Calculation (min): 12 min  Charges: OT General Charges $OT Visit: 1 Visit OT Treatments $Self Care/Home Management : 8-22 mins  Eber Jones., OTR/L Acute Rehabilitation Services Pager [phone] Office [phone]    Jeani Hawking M 01/26/2021, 11:56 AM

## 2021-01-26 NOTE — TOC Transition Note (Signed)
Transition of Care [facility]) - CM/SW Discharge Note   Patient Details  Name: Shane Holmes MRN: [id number] Date of Birth: May 13, 1986  [facility] CM/SW Contact:  Epifanio Lesches, RN Phone Number: 01/26/2021, 12:05 PM   Clinical Narrative:    Patient will DC to: home  Anticipated DC date:01/26/2021 Family notified:yes, sister's mother in law Transport by:car   Per MD patient ready for DC today. RN, patient, and patient's family notified of DC. Home health orders noted. Pt agreeable to home health services. Pt without insurance. Jobless. Referral made with [facility] ( charity care) and accept. [facility] care will be the week of 3/7., pt made aware. Pt will d/c to sister's address: [address], [place], [place] [address]. Match Letter give to assist with med cost. Pt states doesn't need DME ( [facility], 3 in 1/ [facility]) recommended per PT. Post hospital f/u noted on AVS  RNCM will sign off for now as intervention is no longer needed. Please consult [place] again if new needs arise.    Final next level of care: Home w Home Health Services Barriers to Discharge: No Barriers Identified   Patient Goals and CMS Choice Patient states their goals for this hospitalization and ongoing recovery are:: to go home      Discharge Placement                       Discharge Plan and Services   Discharge Planning Services: CM Consult                      HH Arranged: PT,OT [facility] Agency: [facility] Date [facility] Agency Contacted: 01/26/21 Time HH Agency Contacted: 1201 Representative spoke with at [facility] Agency: Misty Stanley  Social Determinants of Health (SDOH) Interventions     Readmission Risk Interventions No flowsheet data found.

## 2021-01-26 NOTE — Progress Notes (Signed)
Physical Therapy Treatment Patient Details Name: Shane Holmes MRN: [id number] DOB: Apr 30, 1986 Today's Date: 01/26/2021    History of Present Illness Pt is a 35 y.o. male admitted 01/22/21 with 3-day h/o bloody stools, nausea/vomiting, SOB, BLE edema; (+) COVID-19. Pt with progressive BLE weakness and ascending paresthesia and areflexia with concern for GBS. S/p lumbar puncture 2/23; results not consistent with GBS. 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 PMH includes ETOH abuse, asthma.   PT Comments    Pt progressing very well with mobility. Mobilizing without DME, no overt LOB with significantly increased gait distance this session. Pt scored 20/24 on Dynamic Gait Index which does not indicate increased risk for falls with higher level balance activities. Pt preparing for d/c home this session; reviewed LE therex and activity recommendations; pt reports no further questions or concerns. Do not feel pt requires follow-up PT services.    Follow Up Recommendations  No PT follow up     Equipment Recommendations  None recommended by PT    Recommendations for Other Services       Precautions / Restrictions Precautions Precautions: Fall Restrictions Weight Bearing Restrictions: No    Mobility  Bed Mobility               General bed mobility comments: Pt received standing in room    Transfers Overall transfer level: Independent Equipment used: None Transfers: Sit to/from [employer] to Stand: Supervision Stand pivot transfers: Supervision          Ambulation/Gait Ambulation/Gait assistance: Supervision Gait Distance (Feet): 400 Feet Assistive device: None Gait Pattern/deviations: Step-through pattern;Decreased stride length;Wide base of support     General Gait Details: Pt ambulating throughout room and hallway without DME, no LOB with higher level balance tasks; supervision for safety due to fall risk   Stairs              Wheelchair Mobility    Modified Rankin (Stroke Patients Only)       Balance Overall balance assessment: Needs assistance Sitting-balance support: Feet supported Sitting balance-Leahy Scale: Good     Standing balance support: During functional activity Standing balance-Leahy Scale: Good               High level balance activites: Side stepping;Backward walking;Direction changes;Turns;Sudden stops;Head turns High Level Balance Comments: No LOB with higher level balance activities Standardized Balance Assessment Standardized Balance Assessment : Dynamic Gait Index   Dynamic Gait Index Level Surface: Mild Impairment Change in Gait Speed: Mild Impairment Gait with Horizontal Head Turns: Normal Gait with Vertical Head Turns: Normal Gait and Pivot Turn: Normal Step Over Obstacle: Mild Impairment Step Around Obstacles: Normal Steps: Mild Impairment Total Score: 20      Cognition Arousal/Alertness: Awake/alert Behavior During Therapy: WFL for tasks assessed/performed Overall Cognitive Status: Within Functional Limits for tasks assessed                                 General Comments: Motivated and excited about progress      Exercises Other Exercises Other Exercises: Partial squats and calf raises w/ UE support; seated calf stretch with strap    General Comments General comments (skin integrity, edema, etc.): Pt is eager to discharge home      Pertinent Vitals/Pain Pain Assessment: Faces Faces Pain Scale: Hurts a little bit Pain Location: BLEs Pain Descriptors / Indicators: Discomfort Pain Intervention(s): Monitored during session    Home Living  Prior Function            PT Goals (current goals can now be found in the care plan section) Acute Rehab PT Goals Patient Stated Goal: to go home Progress towards PT goals: Progressing toward goals    Frequency    Min 3X/week      PT Plan Discharge  plan needs to be updated    Co-evaluation              AM-PAC PT "6 Clicks" Mobility   Outcome Measure  Help needed turning from your back to your side while in a flat bed without using bedrails?: None Help needed moving from lying on your back to sitting on the side of a flat bed without using bedrails?: None Help needed moving to and from a bed to a chair (including a wheelchair)?: None Help needed standing up from a chair using your arms (e.g., wheelchair or bedside chair)?: None Help needed to walk in hospital room?: A Little Help needed climbing 3-5 steps with a railing? : A Little 6 Click Score: 22    End of Session   Activity Tolerance: Patient tolerated treatment well Patient left: in chair;with call bell/phone within reach Nurse Communication: Mobility status PT Visit Diagnosis: Unsteadiness on feet (R26.81);Other abnormalities of gait and mobility (R26.89);Pain     Time: 1142-1153 PT Time Calculation (min) (ACUTE ONLY): 11 min  Charges:  $Gait Training: 8-22 mins                     Ina Homes, PT, DPT Acute Rehabilitation Services  Pager [phone] Office [phone]  Malachy Chamber 01/26/2021, 12:46 PM

## 2021-01-26 NOTE — Discharge Summary (Addendum)
PATIENT DETAILS Name: Shane Holmes Age: 35 y.o. Sex: male Date of Birth: 1986/06/21 MRN: [id number]. Admitting Physician: Maretta Bees, MD [email or web address], No Pcp Per  Admit Date: 01/22/2021 Discharge date: 01/26/2021  Recommendations for Outpatient Follow-up:  1. Follow up with PCP in 1-2 weeks 2. Please obtain CMP/CBC in one week 3. Repeat Chest Xray in 4-6 week 4. Please follow up autoimmune work-up, MAG IgM antibodies, heavy metals, vitamin B6 levels, antimitochondrial antibodies, SPEP/UPEP  Admitted From:  Home  Disposition: Home with home health services   Home Health: Yes  Equipment/Devices: None  Discharge Condition: Stable  CODE STATUS: FULL CODE  Diet recommendation:  Diet Order            Diet general           Diet Carb Modified Fluid consistency: Thin; Room service appropriate? Yes  Diet effective now                  Brief Narrative: Patient is a 35 y.o. male with PMHx of alcohol use, asthma-presenting to the hospital with lower GI bleeding, asthma exacerbation-and lower extremity weakness/neuropathic pain.  See below for further details  COVID-19 vaccinated status: Unvaccinated  Significant Events: 2/21>> Admit to [facility] for lower GI bleed/asthma exacerbation/Covid infection  Significant studies: 2/21>>Chest x-ray: No pneumonia 2/24>> MRI LS spine: Small-2 cm area of soft tissue inflammation in the left erector spinous muscle at L4-L5.  No inflammatory process in the lumbar spine  COVID-19 medications: Remdesivir: 2/21>>  Antibiotics: None  Microbiology data: None  Procedures: 2/23>> lumbar puncture  Consults: Neurology, GI   Brief Hospital Course: Hematochezia: Suspicion for rectal fissure-bleeding has almost resolved this morning-remains on MiraLAX and lidocaine gel.  GI recommending outpatient endoscopic evaluation.  Hemoglobin remained stable.  Acute blood loss anemia: Secondary to above-hemoglobin  stable-follow CBC periodically.  EtOH abuse: No signs of DTsmanaged with Ativan per CIWA protocol.  Counseled extensively-claims he is completely abstaining from alcohol post hospital discharge.   Asthma exacerbation: Much improved-continue bronchodilators-continue to taper steroids.  COVID-19 infection: No hypoxia-no pneumonia on imaging-Remdesivir x3 days.  Hypokalemia: Secondary to EtOH use-repleted.  Follow periodically  Hyponatremia: Likely due to beer potomania-sodium slowly improving.  Continue supportive care and close follow-up periodically  Bilateral lower extremity weakness/bilateral foot pain/bilateral thigh pain: Unclear etiology-some suspicion that this could be alcohol mediated neuropathy/myopathy-LP without any significant pleocytosis or protein elevation.    It appears that the patient has had a remarkable improvement overnight-he essentially has no weakness today-and ambulated in the hallway.  Unclear if this recovery was with just supportive care-not sure if he had a psychogenic component as well.  PCP/outpatient Neurology to follow pending labs  Soft tissue inflammation/myositis in the left erector spinous muscles L4-L5-seen on MRI:?  Sequelae of recent LP-not sure if any clinical significance.  Physical exam is unremarkable.  Already on steroids for bronchial asthma exacerbation.  Prior hepatitis B exposure: Hepatitis B serology consistent with immunity.  HCV antibody negative as well.  Tobacco abuse: Counseled-interested in quitting-on transdermal nicotine.   COVID-19 Labs:  Recent Labs    01/24/21 0045 01/25/21 0217 01/26/21 0154  DDIMER 0.27 0.32 0.32  FERRITIN 196 218 235  CRP 0.8 0.6 1.0*    Lab Results  Component Value Date   SARSCOV2NAA POSITIVE (A) 01/22/2021   Discharge Diagnoses:  Active Problems:   Lower GI bleed   Alcohol withdrawal syndrome with complication, with unspecified complication (HCC)   COVID-19 virus infection   Acute  asthma exacerbation   Hypokalemia   Tobacco abuse   Hyponatremia   Dehydration   Hematochezia   Alcohol abuse   Acute blood loss anemia   Dyschezia   Anal fissure   Discharge Instructions:    Person Under Monitoring Name: Shane Holmes  Location: [address] [place] [address]   Infection Prevention Recommendations for Individuals Confirmed to have, or Being Evaluated for, 2019 Novel Coronavirus (COVID-19) Infection Who Receive Care at Home  Individuals who are confirmed to have, or are being evaluated for, COVID-19 should follow the prevention steps below until a healthcare provider or local or state health department says they can return to normal activities.  Stay home except to get medical care You should restrict activities outside your home, except for getting medical care. Do not go to work, school, or public areas, and do not use public transportation or taxis.  Call ahead before visiting your doctor Before your medical appointment, call the healthcare provider and tell them that you have, or are being evaluated for, COVID-19 infection. This will help the healthcare provider's office take steps to keep other people from getting infected. Ask your healthcare provider to call the local or state health department.  Monitor your symptoms Seek prompt medical attention if your illness is worsening (e.g., difficulty breathing). Before going to your medical appointment, call the healthcare provider and tell them that you have, or are being evaluated for, COVID-19 infection. Ask your healthcare provider to call the local or state health department.  Wear a facemask You should wear a facemask that covers your nose and mouth when you are in the same room with other people and when you visit a healthcare provider. People who live with or visit you should also wear a facemask while they are in the same room with you.  Separate yourself from other people in your  home As much as possible, you should stay in a different room from other people in your home. Also, you should use a separate bathroom, if available.  Avoid sharing household items You should not share dishes, drinking glasses, cups, eating utensils, towels, bedding, or other items with other people in your home. After using these items, you should wash them thoroughly with soap and water.  Cover your coughs and sneezes Cover your mouth and nose with a tissue when you cough or sneeze, or you can cough or sneeze into your sleeve. Throw used tissues in a lined trash can, and immediately wash your hands with soap and water for at least 20 seconds or use an alcohol-based hand rub.  Wash your [employer] your hands often and thoroughly with soap and water for at least 20 seconds. You can use an alcohol-based hand sanitizer if soap and water are not available and if your hands are not visibly dirty. Avoid touching your eyes, nose, and mouth with unwashed hands.   Prevention Steps for Caregivers and Household Members of Individuals Confirmed to have, or Being Evaluated for, COVID-19 Infection Being Cared for in the Home  If you live with, or provide care at home for, a person confirmed to have, or being evaluated for, COVID-19 infection please follow these guidelines to prevent infection:  Follow healthcare provider's instructions Make sure that you understand and can help the patient follow any healthcare provider instructions for all care.  Provide for the patient's basic needs You should help the patient with basic needs in the home and provide support for getting groceries, prescriptions, and  other personal needs.  Monitor the patient's symptoms If they are getting sicker, call his or her medical provider and tell them that the patient has, or is being evaluated for, COVID-19 infection. This will help the healthcare provider's office take steps to keep other people from getting  infected. Ask the healthcare provider to call the local or state health department.  Limit the number of people who have contact with the patient  If possible, have only one caregiver for the patient.  Other household members should stay in another home or place of residence. If this is not possible, they should stay  in another room, or be separated from the patient as much as possible. Use a separate bathroom, if available.  Restrict visitors who do not have an essential need to be in the home.  Keep older adults, very young children, and other sick people away from the patient Keep older adults, very young children, and those who have compromised immune systems or chronic health conditions away from the patient. This includes people with chronic heart, lung, or kidney conditions, diabetes, and cancer.  Ensure good ventilation Make sure that shared spaces in the home have good air flow, such as from an air conditioner or an opened window, weather permitting.  Wash your hands often  Wash your hands often and thoroughly with soap and water for at least 20 seconds. You can use an alcohol based hand sanitizer if soap and water are not available and if your hands are not visibly dirty.  Avoid touching your eyes, nose, and mouth with unwashed hands.  Use disposable paper towels to dry your hands. If not available, use dedicated cloth towels and replace them when they become wet.  Wear a facemask and gloves  Wear a disposable facemask at all times in the room and gloves when you touch or have contact with the patient's blood, body fluids, and/or secretions or excretions, such as sweat, saliva, sputum, nasal mucus, vomit, urine, or feces.  Ensure the mask fits over your nose and mouth tightly, and do not touch it during use.  Throw out disposable facemasks and gloves after using them. Do not reuse.  Wash your hands immediately after removing your facemask and gloves.  If your personal  clothing becomes contaminated, carefully remove clothing and launder. Wash your hands after handling contaminated clothing.  Place all used disposable facemasks, gloves, and other waste in a lined container before disposing them with other household waste.  Remove gloves and wash your hands immediately after handling these items.  Do not share dishes, glasses, or other household items with the patient  Avoid sharing household items. You should not share dishes, drinking glasses, cups, eating utensils, towels, bedding, or other items with a patient who is confirmed to have, or being evaluated for, COVID-19 infection.  After the person uses these items, you should wash them thoroughly with soap and water.  Wash laundry thoroughly  Immediately remove and wash clothes or bedding that have blood, body fluids, and/or secretions or excretions, such as sweat, saliva, sputum, nasal mucus, vomit, urine, or feces, on them.  Wear gloves when handling laundry from the patient.  Read and follow directions on labels of laundry or clothing items and detergent. In general, wash and dry with the warmest temperatures recommended on the label.  Clean all areas the individual has used often  Clean all touchable surfaces, such as counters, tabletops, doorknobs, bathroom fixtures, toilets, phones, keyboards, tablets, and bedside tables, every day. Also,  clean any surfaces that may have blood, body fluids, and/or secretions or excretions on them.  Wear gloves when cleaning surfaces the patient has come in contact with.  Use a diluted bleach solution (e.g., dilute bleach with 1 part bleach and 10 parts water) or a household disinfectant with a label that says EPA-registered for coronaviruses. To make a bleach solution at home, add 1 tablespoon of bleach to 1 quart (4 cups) of water. For a larger supply, add  cup of bleach to 1 gallon (16 cups) of water.  Read labels of cleaning products and follow  recommendations provided on product labels. Labels contain instructions for safe and effective use of the cleaning product including precautions you should take when applying the product, such as wearing gloves or eye protection and making sure you have good ventilation during use of the product.  Remove gloves and wash hands immediately after cleaning.  Monitor yourself for signs and symptoms of illness Caregivers and household members are considered close contacts, should monitor their health, and will be asked to limit movement outside of the home to the extent possible. Follow the monitoring steps for close contacts listed on the symptom monitoring form.   ? If you have additional questions, contact your local health department or call the epidemiologist on call at [phone] (available 24/7). ? This guidance is subject to change. For the most up-to-date guidance from [facility], please refer to their website: [email or web address][email or web address]    Activity:  As tolerated with Full fall precautions use walker/cane & assistance as needed Discharge Instructions    Ambulatory referral to Neurology   Complete by: As directed    An appointment is requested in approximately: 2 weeks   Diet general   Complete by: As directed    Discharge instructions   Complete by: As directed    Follow with Primary MD  in 1-2 weeks  Please get a complete blood count and chemistry panel checked by your Primary MD at your next visit, and again as instructed by your Primary MD.  Get Medicines reviewed and adjusted: Please take all your medications with you for your next visit with your Primary MD  Laboratory/radiological data: Please request your Primary MD to go over all hospital tests and procedure/radiological results at the follow up, please ask your Primary MD to get all Hospital records sent to his/her office.  In some cases, they will be blood work, cultures and  biopsy results pending at the time of your discharge. Please request that your primary care M.D. follows up on these results.  Also Note the following: If you experience worsening of your admission symptoms, develop shortness of breath, life threatening emergency, suicidal or homicidal thoughts you must seek medical attention immediately by calling 911 or calling your MD immediately  if symptoms less severe.  You must read complete instructions/literature along with all the possible adverse reactions/side effects for all the Medicines you take and that have been prescribed to you. Take any new Medicines after you have completely understood and accpet all the possible adverse reactions/side effects.   Do not drive when taking Pain medications or sleeping medications (Benzodaizepines)  Do not take more than prescribed Pain, Sleep and Anxiety Medications. It is not advisable to combine anxiety,sleep and pain medications without talking with your primary care practitioner  Special Instructions: If you have smoked or chewed Tobacco  in the last 2 yrs please stop smoking, stop any regular Alcohol  and or any Recreational drug use.  Wear Seat  belts while driving.  Please note: You were cared for by a hospitalist during your hospital stay. Once you are discharged, your primary care physician will handle any further medical issues. Please note that NO REFILLS for any discharge medications will be authorized once you are discharged, as it is imperative that you return to your primary care physician (or establish a relationship with a primary care physician if you do not have one) for your post hospital discharge needs so that they can reassess your need for medications and monitor your lab values.   Stop all alcohol and tobacco use  5-7 days of isolation from 2/21  You have a significant amount of labs that are still pending to be resulted-please ensure your primary care practitioner or outpatient  neurology follows these results.   Increase activity slowly   Complete by: As directed      Allergies as of 01/26/2021   No Known Allergies     Medication List    STOP taking these medications   ibuprofen 200 MG tablet Commonly known as: ADVIL     TAKE these medications   albuterol 108 (90 Base) MCG/ACT inhaler Commonly known as: VENTOLIN HFA Inhale 2 puffs into the lungs every 6 (six) hours as needed for wheezing or shortness of breath.   capsaicin 0.025 % cream Commonly known as: ZOSTRIX Apply topically 2 (two) times daily. Apply to bilateral feet to knees bid after placing lidocaine cream   folic acid 1 MG tablet Commonly known as: FOLVITE Take 1 tablet (1 mg total) by mouth daily.   gabapentin 400 MG capsule Commonly known as: NEURONTIN Take 1 capsule (400 mg total) by mouth 3 (three) times daily.   lidocaine-EPINEPHrine-tetracaine Gel Commonly known as: LET Apply 3 mLs topically 2 (two) times daily for 7 days.   multivitamin with minerals Tabs tablet Take 1 tablet by mouth daily. Start taking on: January 27, 2021   polyethylene glycol 17 g packet Commonly known as: MIRALAX / GLYCOLAX Take 17 g by mouth daily. Start taking on: January 27, 2021   thiamine 100 MG tablet Take 1 tablet (100 mg total) by mouth daily. Start taking on: January 27, 2021   vitamin B-12 1000 MCG tablet Commonly known as: CYANOCOBALAMIN Take 1 tablet (1,000 mcg total) by mouth daily.       Follow-up Information    [facility] Follow up on 01/29/2021.   Specialty: Family Medicine Why: Virtual appointment scheduled for Monday 2/28 at 1:30 pm Contact information: [address] [place] [address] [phone]       [facility] Follow up.   Why: office will call you with a follow up appt-if you do not hear from them-please give them a call Contact information: [address]     [address] [place]  [id number] [phone]             No Known Allergies  Other Procedures/Studies: MR Lumbar Spine W Wo Contrast  Result Date: 01/25/2021 CLINICAL DATA:  35 year old male with lower GI bleed. Alcohol withdrawal syndrome. COVID-19. Lower extremity weakness, neuropathic pain. EXAM: MRI LUMBAR SPINE WITHOUT AND WITH CONTRAST TECHNIQUE: Multiplanar and multiecho pulse sequences of the lumbar spine were obtained without and with intravenous contrast. CONTRAST:  9mL GADAVIST GADOBUTROL 1 MMOL/ML IV SOLN COMPARISON:  Lumbar MRI 02/02/2009. CT Chest, Abdomen, and Pelvis 11/03/2019. FINDINGS: Segmentation:  Normal on the 2020 CT. Alignment: Normal lumbar lordosis. Stable vertebral height and alignment since 2010.  Vertebrae: No marrow edema or evidence of acute osseous abnormality. Visualized bone marrow signal is within normal limits. Intact visible sacrum and SI joints. 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 No associated spondylolisthesis at that level. See additional facet details below. Conus medullaris and cauda equina: Conus extends to the T12-L1 level. No lower spinal cord or conus signal abnormality. Small fatty filum terminalis (series 9, image 27, normal variant). Cauda equina nerve roots appear normal. No abnormal intradural enhancement or dural thickening. Paraspinal and other soft tissues: Small area of patchy abnormal STIR signal and subtle soft tissue enhancement in the deep left paraspinal erector spinae muscles adjacent to the L4-L5 posterior elements (series 6, image 11 and series 10, image 31). No fluid collection, and the other bilateral paraspinal muscles remain normal. This area was normal on the 2010 MRI. No underlying acute osseous abnormality. Negative visible abdominal viscera. Disc levels: T11-T12: Partially visible and negative. T12-L1:  Negative. L1-L2:  Negative. L2-L3:  Negative. L3-L4:  Negative. L4-L5: Negative disc. Mild to moderate facet and ligament  flavum hypertrophy. Trace degenerative facet joint fluid on the left, which is in proximity to the small area of abnormal paraspinal soft tissue described earlier. But there is no marrow edema in the facets. No stenosis. L5-S1: Negative disc. Mild facet hypertrophy with trace degenerative facet joint fluid. No stenosis. IMPRESSION: 1. Small 2 cm area of soft tissue inflammation/myositis in the left erector spinous muscle at L4-L5. There is regional chronic L4-L5 facet degeneration (see #2) but no associated osseous abnormality and no other acute or inflammatory process in the lumbar spine. 2. 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 But normal lumbar discs with no spinal stenosis or neural impingement. Electronically Signed   By: Odessa Fleming M.D.   On: 01/25/2021 08:53   DG Chest Portable 1 View  Result Date: 01/22/2021 CLINICAL DATA:  Cough EXAM: PORTABLE CHEST 1 VIEW COMPARISON:  11/02/2019 FINDINGS: The heart size and mediastinal contours are within normal limits. Both lungs are clear. Subacute to chronic appearing bilateral lower rib fractures. IMPRESSION: No active disease. Electronically Signed   By: Jasmine Pang M.D.   On: 01/22/2021 15:45   VAS [place] LOWER EXTREMITY VENOUS (DVT)  Result Date: 01/23/2021  Lower Venous DVT Study Indications: Edema.  Risk Factors: Covid+. Comparison Study: No previous exams Performing Technologist: Ernestene Mention  Examination Guidelines: A complete evaluation includes B-mode imaging, spectral Doppler, color Doppler, and power Doppler as needed of all accessible portions of each vessel. Bilateral testing is considered an integral part of a complete examination. Limited examinations for reoccurring indications may be performed as noted. The reflux portion of the exam is performed with the patient in reverse Trendelenburg.  +---------+---------------+---------+-----------+----------+--------------+ RIGHT     CompressibilityPhasicitySpontaneityPropertiesThrombus Aging +---------+---------------+---------+-----------+----------+--------------+ CFV      Full           Yes      Yes                                 +---------+---------------+---------+-----------+----------+--------------+ SFJ      Full                                                        +---------+---------------+---------+-----------+----------+--------------+ FV  Prox  Full           Yes      Yes                                 +---------+---------------+---------+-----------+----------+--------------+ FV Mid   Full           Yes      Yes                                 +---------+---------------+---------+-----------+----------+--------------+ FV DistalFull           Yes      Yes                                 +---------+---------------+---------+-----------+----------+--------------+ PFV      Full                                                        +---------+---------------+---------+-----------+----------+--------------+ POP      Full           Yes      Yes                                 +---------+---------------+---------+-----------+----------+--------------+ PTV      Full                                                        +---------+---------------+---------+-----------+----------+--------------+ PERO     Full                                                        +---------+---------------+---------+-----------+----------+--------------+   +---------+---------------+---------+-----------+----------+--------------+ LEFT     CompressibilityPhasicitySpontaneityPropertiesThrombus Aging +---------+---------------+---------+-----------+----------+--------------+ CFV      Full           Yes      Yes                                 +---------+---------------+---------+-----------+----------+--------------+ SFJ      Full                                                         +---------+---------------+---------+-----------+----------+--------------+ FV Prox  Full           Yes      Yes                                 +---------+---------------+---------+-----------+----------+--------------+ FV Mid   Full  Yes      Yes                                 +---------+---------------+---------+-----------+----------+--------------+ FV DistalFull           Yes      Yes                                 +---------+---------------+---------+-----------+----------+--------------+ PFV      Full                                                        +---------+---------------+---------+-----------+----------+--------------+ POP      Full           Yes      Yes                                 +---------+---------------+---------+-----------+----------+--------------+ PTV      Full                                                        +---------+---------------+---------+-----------+----------+--------------+ PERO     Full                                                        +---------+---------------+---------+-----------+----------+--------------+     Summary: BILATERAL: - No evidence of deep vein thrombosis seen in the lower extremities, bilaterally. - No evidence of superficial venous thrombosis in the lower extremities, bilaterally. -No evidence of popliteal cyst, bilaterally.   *See table(s) above for measurements and observations. Electronically signed by Waverly Ferrari MD on 01/23/2021 at 6:26:32 PM.    Final      TODAY-DAY OF DISCHARGE:  Subjective:   Josefina Do today has no headache,no chest abdominal pain,no new weakness tingling or numbness, feels much better wants to go home today.   Objective:   Blood pressure (!) 131/94, pulse 97, temperature 98.4 F (36.9 C), temperature source Oral, resp. rate 16, SpO2 100 %.  Intake/Output Summary (Last 24 hours) at 01/26/2021 1124 Last data filed at  01/25/2021 2201 Gross per 24 hour  Intake 1080 ml  Output 1250 ml  Net -170 ml   There were no vitals filed for this visit.  Exam: Awake Alert, Oriented *3, No new F.N deficits, Normal affect .AT,PERRAL Supple Neck,No JVD, No cervical lymphadenopathy appriciated.  Symmetrical Chest wall movement, Good air movement bilaterally, CTAB RRR,No Gallops,Rubs or new Murmurs, No Parasternal Heave +ve B.Sounds, Abd Soft, Non tender, No organomegaly appriciated, No rebound -guarding or rigidity. No Cyanosis, Clubbing or edema, No new Rash or bruise   PERTINENT RADIOLOGIC STUDIES: MR Lumbar Spine W Wo Contrast  Result Date: 01/25/2021 CLINICAL DATA:  35 year old male with lower GI bleed. Alcohol withdrawal syndrome. COVID-19. Lower extremity weakness, neuropathic pain. EXAM: MRI LUMBAR SPINE WITHOUT AND  WITH CONTRAST TECHNIQUE: Multiplanar and multiecho pulse sequences of the lumbar spine were obtained without and with intravenous contrast. CONTRAST:  9mL GADAVIST GADOBUTROL 1 MMOL/ML IV SOLN COMPARISON:  Lumbar MRI 02/02/2009. CT Chest, Abdomen, and Pelvis 11/03/2019. FINDINGS: Segmentation:  Normal on the 2020 CT. Alignment: Normal lumbar lordosis. Stable vertebral height and alignment since 2010. Vertebrae: No marrow edema or evidence of acute osseous abnormality. Visualized bone marrow signal is within normal limits. Intact visible sacrum and SI joints. 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 No associated spondylolisthesis at that level. See additional facet details below. Conus medullaris and cauda equina: Conus extends to the T12-L1 level. No lower spinal cord or conus signal abnormality. Small fatty filum terminalis (series 9, image 27, normal variant). Cauda equina nerve roots appear normal. No abnormal intradural enhancement or dural thickening. Paraspinal and other soft tissues: Small area of patchy abnormal STIR signal and subtle soft tissue enhancement in the deep left  paraspinal erector spinae muscles adjacent to the L4-L5 posterior elements (series 6, image 11 and series 10, image 31). No fluid collection, and the other bilateral paraspinal muscles remain normal. This area was normal on the 2010 MRI. No underlying acute osseous abnormality. Negative visible abdominal viscera. Disc levels: T11-T12: Partially visible and negative. T12-L1:  Negative. L1-L2:  Negative. L2-L3:  Negative. L3-L4:  Negative. L4-L5: Negative disc. Mild to moderate facet and ligament flavum hypertrophy. Trace degenerative facet joint fluid on the left, which is in proximity to the small area of abnormal paraspinal soft tissue described earlier. But there is no marrow edema in the facets. No stenosis. L5-S1: Negative disc. Mild facet hypertrophy with trace degenerative facet joint fluid. No stenosis. IMPRESSION: 1. Small 2 cm area of soft tissue inflammation/myositis in the left erector spinous muscle at L4-L5. There is regional chronic L4-L5 facet degeneration (see #2) but no associated osseous abnormality and no other acute or inflammatory process in the lumbar spine. 2. 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 But normal lumbar discs with no spinal stenosis or neural impingement. Electronically Signed   By: Odessa Fleming M.D.   On: 01/25/2021 08:53   DG Chest Portable 1 View  Result Date: 01/22/2021 CLINICAL DATA:  Cough EXAM: PORTABLE CHEST 1 VIEW COMPARISON:  11/02/2019 FINDINGS: The heart size and mediastinal contours are within normal limits. Both lungs are clear. Subacute to chronic appearing bilateral lower rib fractures. IMPRESSION: No active disease. Electronically Signed   By: Jasmine Pang M.D.   On: 01/22/2021 15:45   VAS [place] LOWER EXTREMITY VENOUS (DVT)  Result Date: 01/23/2021  Lower Venous DVT Study Indications: Edema.  Risk Factors: Covid+. Comparison Study: No previous exams Performing Technologist: Ernestene Mention  Examination Guidelines: A complete evaluation  includes B-mode imaging, spectral Doppler, color Doppler, and power Doppler as needed of all accessible portions of each vessel. Bilateral testing is considered an integral part of a complete examination. Limited examinations for reoccurring indications may be performed as noted. The reflux portion of the exam is performed with the patient in reverse Trendelenburg.  +---------+---------------+---------+-----------+----------+--------------+ RIGHT    CompressibilityPhasicitySpontaneityPropertiesThrombus Aging +---------+---------------+---------+-----------+----------+--------------+ CFV      Full           Yes      Yes                                 +---------+---------------+---------+-----------+----------+--------------+ SFJ  Full                                                        +---------+---------------+---------+-----------+----------+--------------+ FV Prox  Full           Yes      Yes                                 +---------+---------------+---------+-----------+----------+--------------+ FV Mid   Full           Yes      Yes                                 +---------+---------------+---------+-----------+----------+--------------+ FV DistalFull           Yes      Yes                                 +---------+---------------+---------+-----------+----------+--------------+ PFV      Full                                                        +---------+---------------+---------+-----------+----------+--------------+ POP      Full           Yes      Yes                                 +---------+---------------+---------+-----------+----------+--------------+ PTV      Full                                                        +---------+---------------+---------+-----------+----------+--------------+ PERO     Full                                                         +---------+---------------+---------+-----------+----------+--------------+   +---------+---------------+---------+-----------+----------+--------------+ LEFT     CompressibilityPhasicitySpontaneityPropertiesThrombus Aging +---------+---------------+---------+-----------+----------+--------------+ CFV      Full           Yes      Yes                                 +---------+---------------+---------+-----------+----------+--------------+ SFJ      Full                                                        +---------+---------------+---------+-----------+----------+--------------+ FV Prox  Full  Yes      Yes                                 +---------+---------------+---------+-----------+----------+--------------+ FV Mid   Full           Yes      Yes                                 +---------+---------------+---------+-----------+----------+--------------+ FV DistalFull           Yes      Yes                                 +---------+---------------+---------+-----------+----------+--------------+ PFV      Full                                                        +---------+---------------+---------+-----------+----------+--------------+ POP      Full           Yes      Yes                                 +---------+---------------+---------+-----------+----------+--------------+ PTV      Full                                                        +---------+---------------+---------+-----------+----------+--------------+ PERO     Full                                                        +---------+---------------+---------+-----------+----------+--------------+     Summary: BILATERAL: - No evidence of deep vein thrombosis seen in the lower extremities, bilaterally. - No evidence of superficial venous thrombosis in the lower extremities, bilaterally. -No evidence of popliteal cyst, bilaterally.   *See table(s) above for measurements  and observations. Electronically signed by Waverly Ferrari MD on 01/23/2021 at 6:26:32 PM.    Final      PERTINENT LAB RESULTS: CBC: Recent Labs    01/25/21 0217 01/26/21 0154  WBC 7.9 8.2  HGB 10.7* 9.7*  HCT 32.2* 29.3*  PLT 238 238   CMET CMP     Component Value Date/Time   NA 133 (L) 01/26/2021 0154   K 3.6 01/26/2021 0154   CL 99 01/26/2021 0154   CO2 24 01/26/2021 0154   GLUCOSE 126 (H) 01/26/2021 0154   BUN 19 01/26/2021 0154   CREATININE 0.77 01/26/2021 0154   CALCIUM 8.3 (L) 01/26/2021 0154   PROT 6.0 (L) 01/26/2021 0154   ALBUMIN 3.3 (L) 01/26/2021 0154   AST 55 (H) 01/26/2021 0154   ALT 26 01/26/2021 0154   ALKPHOS 85 01/26/2021 0154   BILITOT 1.0 01/26/2021 0154   GFRNONAA >60 01/26/2021 0154   GFRAA >60 11/02/2019  2036    GFR CrCl cannot be calculated (Unknown ideal weight.). No results for input(s): LIPASE, AMYLASE in the last 72 hours. No results for input(s): CKTOTAL, CKMB, CKMBINDEX, TROPONINI in the last 72 hours. Invalid input(s): POCBNP Recent Labs    01/25/21 0217 01/26/21 0154  DDIMER 0.32 0.32   Recent Labs    01/24/21 1455  HGBA1C 6.0*   No results for input(s): CHOL, HDL, LDLCALC, TRIG, CHOLHDL, LDLDIRECT in the last 72 hours. Recent Labs    01/25/21 1107  TSH 1.491   Recent Labs    01/24/21 1455 01/25/21 0217 01/26/21 0154  FOLATE 4.0*  --   --   FERRITIN  --  218 235   Coags: No results for input(s): INR in the last 72 hours.  Invalid input(s): PT Microbiology: Recent Results (from the past 240 hour(s))  Resp Panel by RT-PCR (Flu A&B, Covid) Nasopharyngeal Swab     Status: Abnormal   Collection Time: 01/22/21  3:41 PM   Specimen: Nasopharyngeal Swab; Nasopharyngeal(NP) swabs in vial transport medium  Result Value Ref Range Status   SARS Coronavirus 2 by RT PCR POSITIVE (A) NEGATIVE Final    Comment: RESULT CALLED TO, READ BACK BY AND VERIFIED WITH: Keitha Butte RN 1725 01/22/21 A BROWNING (NOTE) SARS-CoV-2  target nucleic acids are DETECTED.  The SARS-CoV-2 RNA is generally detectable in upper respiratory specimens during the acute phase of infection. Positive results are indicative of the presence of the identified virus, but do not rule out bacterial infection or co-infection with other pathogens not detected by the test. Clinical correlation with patient history and other diagnostic information is necessary to determine patient infection status. The expected result is Negative.  Fact Sheet for Patients: BloggerCourse.com  Fact Sheet for Healthcare Providers: SeriousBroker.it  This test is not yet approved or cleared by the [place] FDA and  has been authorized for detection and/or diagnosis of SARS-CoV-2 by FDA under an Emergency Use Authorization (EUA).  This EUA will remain in effect (meaning this test can  be used) for the duration of  the COVID-19 declaration under Section 564(b)(1) of the Act, 21 U.S.C. section 360bbb-3(b)(1), unless the authorization is terminated or revoked sooner.     Influenza A by PCR NEGATIVE NEGATIVE Final   Influenza B by PCR NEGATIVE NEGATIVE Final    Comment: (NOTE) The Xpert Xpress SARS-CoV-2/FLU/RSV plus assay is intended as an aid in the diagnosis of influenza from Nasopharyngeal swab specimens and should not be used as a sole basis for treatment. Nasal washings and aspirates are unacceptable for Xpert Xpress SARS-CoV-2/FLU/RSV testing.  Fact Sheet for Patients: BloggerCourse.com  Fact Sheet for Healthcare Providers: SeriousBroker.it  This test is not yet approved or cleared by the [place] FDA and has been authorized for detection and/or diagnosis of SARS-CoV-2 by FDA under an Emergency Use Authorization (EUA). This EUA will remain in effect (meaning this test can be used) for the duration of the COVID-19 declaration under Section  564(b)(1) of the Act, 21 U.S.C. section 360bbb-3(b)(1), unless the authorization is terminated or revoked.  Performed at [facility], [address] [place], [place] [address]   CSF culture w Gram Stain     Status: None (Preliminary result)   Collection Time: 01/24/21  4:21 PM   Specimen: CSF; Cerebrospinal Fluid  Result Value Ref Range Status   Specimen Description CSF  Final   Special Requests Normal  Final   Gram Stain   Final  WBC PRESENT, PREDOMINANTLY MONONUCLEAR NO ORGANISMS SEEN CYTOSPIN SMEAR    Culture   Final    NO GROWTH 2 DAYS Performed at [facility], [address], [place], [place] [address]    Report Status PENDING  Incomplete    FURTHER DISCHARGE INSTRUCTIONS:  Get Medicines reviewed and adjusted: Please take all your medications with you for your next visit with your Primary MD  Laboratory/radiological data: Please request your Primary MD to go over all hospital tests and procedure/radiological results at the follow up, please ask your Primary MD to get all Hospital records sent to his/her office.  In some cases, they will be blood work, cultures and biopsy results pending at the time of your discharge. Please request that your primary care M.D. goes through all the records of your hospital data and follows up on these results.  Also Note the following: If you experience worsening of your admission symptoms, develop shortness of breath, life threatening emergency, suicidal or homicidal thoughts you must seek medical attention immediately by calling 911 or calling your MD immediately  if symptoms less severe.  You must read complete instructions/literature along with all the possible adverse reactions/side effects for all the Medicines you take and that have been prescribed to you. Take any new Medicines after you have completely understood and accpet all the possible adverse reactions/side effects.   Do not drive when taking Pain medications  or sleeping medications (Benzodaizepines)  Do not take more than prescribed Pain, Sleep and Anxiety Medications. It is not advisable to combine anxiety,sleep and pain medications without talking with your primary care practitioner  Special Instructions: If you have smoked or chewed Tobacco  in the last 2 yrs please stop smoking, stop any regular Alcohol  and or any Recreational drug use.  Wear Seat belts while driving.  Please note: You were cared for by a hospitalist during your hospital stay. Once you are discharged, your primary care physician will handle any further medical issues. Please note that NO REFILLS for any discharge medications will be authorized once you are discharged, as it is imperative that you return to your primary care physician (or establish a relationship with a primary care physician if you do not have one) for your post hospital discharge needs so that they can reassess your need for medications and monitor your lab values.  Total Time spent coordinating discharge including counseling, education and face to face time equals 35 minutes.  SignedJeoffrey Massed 01/26/2021 11:24 AM

## 2021-01-26 NOTE — Progress Notes (Signed)
Patient discharging home. Vital signs stable at time of discharge as reflected in discharge summary. Discharge instructions given and verbal understanding returned. Patient discharging with medications from pharmacy.

## 2021-01-26 NOTE — Progress Notes (Signed)
Neurology Progress Note Subjective: No acute overnight events Patient endorses improvement in weakness and pain in his lower extremities. States that today he is able to stand and ambulate a short distance without assistance.  Claims he is ready to get out of the hospital.   Exam: Vitals:   01/26/21 0438 01/26/21 0744  BP: (!) 152/90 (!) 131/94  Pulse: 97 97  Resp: 20 16  Temp: 98.2 F (36.8 C) 98.4 F (36.9 C)  SpO2: 100%    Gen: Sitting up in recliner at bedside, in no acute distress Resp: non-labored breathing, no acute distress Abd: soft, nt  NEURO:  Mental Status: alert and oriented to person, place, time, and situation. Patient is able to give a clear and coherent history of present illness. Speech is without aphasia or dysarthria. Naming, repetition, fluency, and comprehension intact. Cranial Nerves: II: Visual fields full  III, IV, VI: EOMI. Eyelids elevate symmetrically.  V: Sensation is intact to light touch and symmetrical to face.  VII: Face is symmetric resting and smiling VIII: Hearing intact to voice. IX, X: Phonation is normal.  GY:JEHUDJSH shrug symmetric Motor:  5/5 to BUEs. 5/5 to Bilateral thighs and with kicking out with his lower legs. 4/5 at the right hip, 5/5 at the left hip. 4-/5 strength to RLE plantar and dorsiflexion. 4+/5 to right dorsiflexion and plantar flexion.  Tone is normal and bulk is normal Sensation: Intact to light touch bilaterally. Extinction absent to light touch to DSS. Sensation to cool temperature intact but with cooler temperature felt proximally bilaterally.  Coordination: FTN and HKS intact bilaterally. No pronator drift.  DTRs: 0+ LEs. 2+ biceps symmetrically   Pertinent Labs: CBC    Component Value Date/Time   WBC 8.2 01/26/2021 0154   RBC 2.99 (L) 01/26/2021 0154   HGB 9.7 (L) 01/26/2021 0154   HCT 29.3 (L) 01/26/2021 0154   PLT 238 01/26/2021 0154   MCV 98.0 01/26/2021 0154   MCH 32.4 01/26/2021 0154   MCHC 33.1  01/26/2021 0154   RDW 17.6 (H) 01/26/2021 0154   LYMPHSABS 1.2 01/26/2021 0154   MONOABS 1.2 (H) 01/26/2021 0154   EOSABS 0.0 01/26/2021 0154   BASOSABS 0.0 01/26/2021 0154   CMP     Component Value Date/Time   NA 133 (L) 01/26/2021 0154   K 3.6 01/26/2021 0154   CL 99 01/26/2021 0154   CO2 24 01/26/2021 0154   GLUCOSE 126 (H) 01/26/2021 0154   BUN 19 01/26/2021 0154   CREATININE 0.77 01/26/2021 0154   CALCIUM 8.3 (L) 01/26/2021 0154   PROT 6.0 (L) 01/26/2021 0154   ALBUMIN 3.3 (L) 01/26/2021 0154   AST 55 (H) 01/26/2021 0154   ALT 26 01/26/2021 0154   ALKPHOS 85 01/26/2021 0154   BILITOT 1.0 01/26/2021 0154   GFRNONAA >60 01/26/2021 0154   GFRAA >60 11/02/2019 2036   Lab Results  Component Value Date   TSH 1.491 01/25/2021   Results for orders placed or performed during the hospital encounter of 01/22/21  Resp Panel by RT-PCR (Flu A&B, Covid) Nasopharyngeal Swab     Status: Abnormal   Collection Time: 01/22/21  3:41 PM   Specimen: Nasopharyngeal Swab; Nasopharyngeal(NP) swabs in vial transport medium  Result Value Ref Range Status   SARS Coronavirus 2 by RT PCR POSITIVE (A) NEGATIVE Final    Comment: RESULT CALLED TO, READ BACK BY AND VERIFIED WITH: Thana Ates RN [id number] 01/22/21 A BROWNING (NOTE) SARS-CoV-2 target nucleic acids are DETECTED.  The SARS-CoV-2  RNA is generally detectable in upper respiratory specimens during the acute phase of infection. Positive results are indicative of the presence of the identified virus, but do not rule out bacterial infection or co-infection with other pathogens not detected by the test. Clinical correlation with patient history and other diagnostic information is necessary to determine patient infection status. The expected result is Negative.  Fact Sheet for Patients: EntrepreneurPulse.com.au  Fact Sheet for Healthcare Providers: IncredibleEmployment.be  This test is not yet approved or  cleared by the [place] FDA and  has been authorized for detection and/or diagnosis of SARS-CoV-2 by FDA under an Emergency Use Authorization (EUA).  This EUA will remain in effect (meaning this test can  be used) for the duration of  the COVID-19 declaration under Section 564(b)(1) of the Act, 21 U.S.C. section 360bbb-3(b)(1), unless the authorization is terminated or revoked sooner.     Influenza A by PCR NEGATIVE NEGATIVE Final   Influenza B by PCR NEGATIVE NEGATIVE Final    Comment: (NOTE) The Xpert Xpress SARS-CoV-2/FLU/RSV plus assay is intended as an aid in the diagnosis of influenza from Nasopharyngeal swab specimens and should not be used as a sole basis for treatment. Nasal washings and aspirates are unacceptable for Xpert Xpress SARS-CoV-2/FLU/RSV testing.  Fact Sheet for Patients: EntrepreneurPulse.com.au  Fact Sheet for Healthcare Providers: IncredibleEmployment.be  This test is not yet approved or cleared by the [place] FDA and has been authorized for detection and/or diagnosis of SARS-CoV-2 by FDA under an Emergency Use Authorization (EUA). This EUA will remain in effect (meaning this test can be used) for the duration of the COVID-19 declaration under Section 564(b)(1) of the Act, 21 U.S.C. section 360bbb-3(b)(1), unless the authorization is terminated or revoked.  Performed at [facility], [place] [address] [place], [place] [address]   CSF culture w Gram Stain     Status: None (Preliminary result)   Collection Time: 01/24/21  4:21 PM   Specimen: CSF; Cerebrospinal Fluid  Result Value Ref Range Status   Specimen Description CSF  Final   Special Requests Normal  Final   Gram Stain   Final    WBC PRESENT, PREDOMINANTLY MONONUCLEAR NO ORGANISMS SEEN CYTOSPIN SMEAR    Culture   Final    NO GROWTH 2 DAYS Performed at [facility], [place] [address] [place], [place] [address]    Report Status PENDING   Incomplete   ANA negative ESR normal- 8 Rheumatoid factor < 10  Imaging: MRI L-spine 1. Small 2 cm area of soft tissue inflammation/myositis in the left erector spinous muscle at L4-L5. There is regional chronic L4-L5 facet degeneration (see #2) but no associated osseous abnormality and no other acute or inflammatory process in the lumbar spine. 2. 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 But normal lumbar discs with no spinal stenosis or neural impingement.  Assessment : 35 yo male with a presentation resembling GBS, however his CSF testing is not consistent with this. Consider other types of neuropathy including ETOH neuropathy with improved strength on assessment 01/26/2021 and subjective improvement in pain. MRI with myopathy vs inflammation in a localized area at L4-5-possibly secondary to the LP that he had had just before MRI.  - DTRs absent in lower extremities with subjective improvement in painful neuropathy in bilateral feet, numbness, and strength in bilateral lower extremities.  - CSF without evidence supporting GBS diagnosis - Patient with history of alcohol abuse, at least 12 beers daily, and  history of DTs with alcohol withdrawal. - Further nephropathy work-up ordered/pending: heavy metals serum and urine, thyroid function tests, ANA, ESR, urine/blood for porphyrins, RA, Sjogren testing, Anti Hu antibodies, Methylmalonic acid, homocysteine levels. Also [facility] send outs-antimyelin associated glycoprotein and Anti GM1 antibodies.  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 - Try lidocaine cream with a capsaicin layer over that BID.  - Continue PT/OT - Pending labs: heavy metals serum, porphyrins, ANA, MAG IgM antibodies, Vitamin B6, Sjogren's syndrome testing, methylmalonic acid, homocysteine, antimyelin  associated glycoprotein, anti GM1 antibodies - Continue B12 and Thiamine repletion.  - Will not initiate IVIG at this time, low suspicion for GBS with negative CSF studies and improvement in pain, numbness, and weakness with B12 and thiamine repletion.  - Recommend follow up with outpatient neurology in 6-8 weeks to follow up on labs; consider EMG studies with persistent neuropathy at that time.   SAnibal Henderson AGACNP-BC [facility] [phone]  Attending addendum Patient was discharged prior to me seeing the patient. I discussed the case with SAnibal HendersonNP and Dr. GSloan LeiterPatient was still areflexic in lower extremities but had reflexes in the upper extremity. Today he is able to walk much more comfortably than yesterday. This is way too quick for Guillain-Barr to resolve on its own. I do not suspect that he had any sort of Guillain-Barr syndrome based on the CSF finding and absence of preceding infection. 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 He should follow-up with outpatient neurology for EMG nerve conduction studies.  -- AAmie Portland MD Neurologist [facility] Pager: [phone]

## 2021-01-26 NOTE — Discharge Instructions (Signed)
Person Under Monitoring Name: Shane Holmes  Location: [address] [place] [address]   Infection Prevention Recommendations for Individuals Confirmed to have, or Being Evaluated for, 2019 Novel Coronavirus (COVID-19) Infection Who Receive Care at Home  Individuals who are confirmed to have, or are being evaluated for, COVID-19 should follow the prevention steps below until a healthcare provider or local or state health department says they can return to normal activities.  Stay home except to get medical care You should restrict activities outside your home, except for getting medical care. Do not go to work, school, or public areas, and do not use public transportation or taxis.  Call ahead before visiting your doctor Before your medical appointment, call the healthcare provider and tell them that you have, or are being evaluated for, COVID-19 infection. This will help the healthcare providers office take steps to keep other people from getting infected. Ask your healthcare provider to call the local or state health department.  Monitor your symptoms Seek prompt medical attention if your illness is worsening (e.g., difficulty breathing). Before going to your medical appointment, call the healthcare provider and tell them that you have, or are being evaluated for, COVID-19 infection. Ask your healthcare provider to call the local or state health department.  Wear a facemask You should wear a facemask that covers your nose and mouth when you are in the same room with other people and when you visit a healthcare provider. People who live with or visit you should also wear a facemask while they are in the same room with you.  Separate yourself from other people in your home As much as possible, you should stay in a different room from other people in your home. Also, you should use a separate bathroom, if available.  Avoid sharing household items You should not share  dishes, drinking glasses, cups, eating utensils, towels, bedding, or other items with other people in your home. After using these items, you should wash them thoroughly with soap and water.  Cover your coughs and sneezes Cover your mouth and nose with a tissue when you cough or sneeze, or you can cough or sneeze into your sleeve. Throw used tissues in a lined trash can, and immediately wash your hands with soap and water for at least 20 seconds or use an alcohol-based hand rub.  Wash your [employer] your hands often and thoroughly with soap and water for at least 20 seconds. You can use an alcohol-based hand sanitizer if soap and water are not available and if your hands are not visibly dirty. Avoid touching your eyes, nose, and mouth with unwashed hands.   Prevention Steps for Caregivers and Household Members of Individuals Confirmed to have, or Being Evaluated for, COVID-19 Infection Being Cared for in the Home  If you live with, or provide care at home for, a person confirmed to have, or being evaluated for, COVID-19 infection please follow these guidelines to prevent infection:  Follow healthcare providers instructions Make sure that you understand and can help the patient follow any healthcare provider instructions for all care.  Provide for the patients basic needs You should help the patient with basic needs in the home and provide support for getting groceries, prescriptions, and other personal needs.  Monitor the patients symptoms If they are getting sicker, call his or her medical provider and tell them that the patient has, or is being evaluated for, COVID-19 infection. This will help the healthcare providers office  take steps to keep other people from getting infected. Ask the healthcare provider to call the local or state health department.  Limit the number of people who have contact with the patient  If possible, have only one caregiver for the patient.  Other  household members should stay in another home or place of residence. If this is not possible, they should stay  in another room, or be separated from the patient as much as possible. Use a separate bathroom, if available.  Restrict visitors who do not have an essential need to be in the home.  Keep older adults, very young children, and other sick people away from the patient Keep older adults, very young children, and those who have compromised immune systems or chronic health conditions away from the patient. This includes people with chronic heart, lung, or kidney conditions, diabetes, and cancer.  Ensure good ventilation Make sure that shared spaces in the home have good air flow, such as from an air conditioner or an opened window, weather permitting.  Wash your hands often  Wash your hands often and thoroughly with soap and water for at least 20 seconds. You can use an alcohol based hand sanitizer if soap and water are not available and if your hands are not visibly dirty.  Avoid touching your eyes, nose, and mouth with unwashed hands.  Use disposable paper towels to dry your hands. If not available, use dedicated cloth towels and replace them when they become wet.  Wear a facemask and gloves  Wear a disposable facemask at all times in the room and gloves when you touch or have contact with the patients blood, body fluids, and/or secretions or excretions, such as sweat, saliva, sputum, nasal mucus, vomit, urine, or feces.  Ensure the mask fits over your nose and mouth tightly, and do not touch it during use.  Throw out disposable facemasks and gloves after using them. Do not reuse.  Wash your hands immediately after removing your facemask and gloves.  If your personal clothing becomes contaminated, carefully remove clothing and launder. Wash your hands after handling contaminated clothing.  Place all used disposable facemasks, gloves, and other waste in a lined container before  disposing them with other household waste.  Remove gloves and wash your hands immediately after handling these items.  Do not share dishes, glasses, or other household items with the patient  Avoid sharing household items. You should not share dishes, drinking glasses, cups, eating utensils, towels, bedding, or other items with a patient who is confirmed to have, or being evaluated for, COVID-19 infection.  After the person uses these items, you should wash them thoroughly with soap and water.  Wash laundry thoroughly  Immediately remove and wash clothes or bedding that have blood, body fluids, and/or secretions or excretions, such as sweat, saliva, sputum, nasal mucus, vomit, urine, or feces, on them.  Wear gloves when handling laundry from the patient.  Read and follow directions on labels of laundry or clothing items and detergent. In general, wash and dry with the warmest temperatures recommended on the label.  Clean all areas the individual has used often  Clean all touchable surfaces, such as counters, tabletops, doorknobs, bathroom fixtures, toilets, phones, keyboards, tablets, and bedside tables, every day. Also, clean any surfaces that may have blood, body fluids, and/or secretions or excretions on them.  Wear gloves when cleaning surfaces the patient has come in contact with.  Use a diluted bleach solution (e.g., dilute bleach with 1 part  bleach and 10 parts water) or a household disinfectant with a label that says EPA-registered for coronaviruses. To make a bleach solution at home, add 1 tablespoon of bleach to 1 quart (4 cups) of water. For a larger supply, add  cup of bleach to 1 gallon (16 cups) of water.  Read labels of cleaning products and follow recommendations provided on product labels. Labels contain instructions for safe and effective use of the cleaning product including precautions you should take when applying the product, such as wearing gloves or eye protection  and making sure you have good ventilation during use of the product.  Remove gloves and wash hands immediately after cleaning.  Monitor yourself for signs and symptoms of illness Caregivers and household members are considered close contacts, should monitor their health, and will be asked to limit movement outside of the home to the extent possible. Follow the monitoring steps for close contacts listed on the symptom monitoring form.   ? If you have additional questions, contact your local health department or call the epidemiologist on call at [phone] (available 24/7). ? This guidance is subject to change. For the most up-to-date guidance from [facility], please refer to their website: YouBlogs.pl

## 2021-01-27 LAB — HEAVY METALS, BLOOD
Arsenic: 1 ug/L (ref 0–9)
Lead: <1 ug/dL (ref 0–4)
Mercury: <1 ug/L (ref 0.0–14.9)

## 2021-01-28 LAB — CSF CULTURE W GRAM STAIN
Culture: NO GROWTH
Special Requests: NORMAL

## 2021-01-29 ENCOUNTER — Telehealth (INDEPENDENT_AMBULATORY_CARE_PROVIDER_SITE_OTHER): Payer: HRSA Program | Admitting: Nurse Practitioner

## 2021-01-29 DIAGNOSIS — E871 Hypo-osmolality and hyponatremia: Secondary | ICD-10-CM

## 2021-01-29 DIAGNOSIS — K921 Melena: Secondary | ICD-10-CM

## 2021-01-29 DIAGNOSIS — J4521 Mild intermittent asthma with (acute) exacerbation: Secondary | ICD-10-CM

## 2021-01-29 DIAGNOSIS — Z8616 Personal history of COVID-19: Secondary | ICD-10-CM | POA: Insufficient documentation

## 2021-01-29 DIAGNOSIS — U071 COVID-19: Secondary | ICD-10-CM

## 2021-01-29 DIAGNOSIS — E876 Hypokalemia: Secondary | ICD-10-CM

## 2021-01-29 DIAGNOSIS — K922 Gastrointestinal hemorrhage, unspecified: Secondary | ICD-10-CM

## 2021-01-29 NOTE — Patient Instructions (Signed)
History of Covid:  Hematochezia: Suspicion for rectal fissure-bleeding has almost resolved this morning-remains on MiraLAX and lidocaine gel.   Will place referral to GI  Acute blood loss anemia: Secondary to above-hemoglobin stable  -follow CBC periodically through new PCP  Asthma exacerbation: Much improved-continue bronchodilators-continue to taper steroids.  COVID-19 infection: No hypoxia-no pneumonia on imaging- Recieved Remdesivir  Hypokalemia: Secondary to EtOH use-repleted. Follow periodically with new PCP  Hyponatremia:Likely due to beer potomania-sodium slowly improving. 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    Follow up with new PCP  Follow up:  If needed  Appointment scheduled for new PCP

## 2021-01-29 NOTE — Assessment & Plan Note (Signed)
Hematochezia: Suspicion for rectal fissure-bleeding has almost resolved this morning-remains on MiraLAX and lidocaine gel.   Will place referral to GI  Acute blood loss anemia: Secondary to above-hemoglobin stable  -follow CBC periodically through new PCP  Asthma exacerbation: Much improved-continue bronchodilators-continue to taper steroids.  COVID-19 infection: No hypoxia-no pneumonia on imaging- Recieved Remdesivir  Hypokalemia: Secondary to EtOH use-repleted. Follow periodically with new PCP  Hyponatremia:Likely due to beer potomania-sodium slowly improving. 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    Follow up with new PCP  Follow up:  If needed  Appointment scheduled for new PCP

## 2021-01-29 NOTE — Progress Notes (Signed)
Virtual Visit via Telephone Note  I connected with Shane Holmes on 01/29/21 at  1:30 PM EST by telephone and verified that I am speaking with the correct person using two identifiers.  Location: Patient: home Provider: office   I discussed the limitations, risks, security and privacy concerns of performing an evaluation and management service by telephone and the availability of in person appointments. I also discussed with the patient that there may be a patient responsible charge related to this service. The patient expressed understanding and agreed to proceed.   35 year old male with history of asthma, alcohol abuse, tobacco abuse, back pain.   Recent Significant events:  Brief Narrative: Patient is a9 y.o.malewith PMHx of alcohol use, asthma-presenting to the hospital with lower GI bleeding, asthma exacerbation-and lower extremity weakness/neuropathic pain. See below for further details  COVID-19 vaccinated status: Unvaccinated  Significant Events: 2/21>> Admit to [facility] for lower GI bleed/asthma exacerbation/Covid infection  Significant studies: 2/21>>Chest x-ray: No pneumonia 2/24>>MRI LS spine: Small-2 cm area of soft tissue inflammation in the left erector spinous muscle at L4-L5. No inflammatory process in the lumbar spine  COVID-19 medications: Remdesivir: 2/21>>  Antibiotics: None  Microbiology data: None  Procedures: 2/23>>lumbar puncture  Consults: Neurology, GI   History of Present Illness:  Patient presents today for post COVID care clinic visit through televisit.  Patient is being seen for hospital follow-up.  He was admitted to the hospital on 01/22/2021 and discharged on 01/26/2021.  Patient was evaluated for hematochezia which was suspected to be from a rectal fissure bleeding.  Patient was treated with MiraLAX and lidocaine gel.  ED note states the patient will need a GI referral for possible endoscopy.  Patient was positive for Covid  but imaging showed no pneumonia.  Patient was also treated for hypokalemia and hyponatremia this is most likely due to EtOH use.  Patient complained of bilateral lower extremity weakness and bilateral foot pain.  ED physician noted that this could be related to alcohol mediated neuropathy and myopathy.  Patient states that he has been doing well since hospital discharge.  He does still have pain to his lower extremities.  Patient does not currently have a PCP and we discussed that we will set him up to establish care with PCP before and visit today. Denies f/c/s, n/v/d, hemoptysis, PND, chest pain or edema.     Observations/Objective:   Assessment and Plan:  History of Covid:  Hematochezia: Suspicion for rectal fissure-bleeding has almost resolved this morning-remains on MiraLAX and lidocaine gel.   Will place referral to GI  Acute blood loss anemia: Secondary to above-hemoglobin stable  -follow CBC periodically through new PCP  Asthma exacerbation: Much improved-continue bronchodilators-continue to taper steroids.  COVID-19 infection: No hypoxia-no pneumonia on imaging- Recieved Remdesivir  Hypokalemia: Secondary to EtOH use-repleted. Follow periodically with new PCP  Hyponatremia:Likely due to beer potomania-sodium slowly improving. 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    Follow up with new PCP     Follow Up Instructions:  Follow up:  If needed  Appointment scheduled for new PCP     I discussed the assessment and treatment plan with the patient. The patient was provided an opportunity to ask questions and all were answered. The patient agreed with the plan and demonstrated an understanding of the  instructions.   The  patient was advised to call back or seek an in-person evaluation if the symptoms worsen or if the condition fails to improve as anticipated.  I provided 25 minutes of non-face-to-face time during this encounter.   Ivonne Andrew, NP

## 2021-01-30 LAB — MAG IGM ANTIBODIES: MAG IgM Antibodies: 14310 BTU — ABNORMAL HIGH (ref 0–999)

## 2021-01-30 LAB — VITAMIN B6: Vitamin B6: 12 ug/L (ref 5.3–46.7)

## 2021-02-01 ENCOUNTER — Other Ambulatory Visit: Payer: Self-pay

## 2021-02-01 ENCOUNTER — Ambulatory Visit (INDEPENDENT_AMBULATORY_CARE_PROVIDER_SITE_OTHER): Payer: Self-pay | Admitting: Nurse Practitioner

## 2021-02-01 ENCOUNTER — Encounter: Payer: Self-pay | Admitting: Nurse Practitioner

## 2021-02-01 VITALS — BP 137/87 | HR 108 | Ht 71.0 in | Wt 193.0 lb

## 2021-02-01 DIAGNOSIS — J45909 Unspecified asthma, uncomplicated: Secondary | ICD-10-CM

## 2021-02-01 DIAGNOSIS — Z7689 Persons encountering health services in other specified circumstances: Secondary | ICD-10-CM

## 2021-02-01 DIAGNOSIS — F1010 Alcohol abuse, uncomplicated: Secondary | ICD-10-CM

## 2021-02-01 DIAGNOSIS — K602 Anal fissure, unspecified: Secondary | ICD-10-CM

## 2021-02-01 DIAGNOSIS — K921 Melena: Secondary | ICD-10-CM

## 2021-02-01 DIAGNOSIS — G609 Hereditary and idiopathic neuropathy, unspecified: Secondary | ICD-10-CM

## 2021-02-01 DIAGNOSIS — Z8616 Personal history of COVID-19: Secondary | ICD-10-CM

## 2021-02-01 DIAGNOSIS — Z716 Tobacco abuse counseling: Secondary | ICD-10-CM

## 2021-02-01 LAB — MISC LABCORP TEST (SEND OUT): Labcorp test code: 140280

## 2021-02-01 LAB — METHYLMALONIC ACID, SERUM: Methylmalonic Acid, Quantitative: 257 nmol/L (ref 0–378)

## 2021-02-01 NOTE — Progress Notes (Signed)
[facility] Patient [facility] [address] [place], [place]  [address] Phone:  [phone]   Fax:  [phone]  Virtual Visit via Telephone Note  I connected with Shane Holmes on 02/04/21 at  1:00 PM EST by telephone and verified that I am speaking with the correct person using two identifiers.   I discussed the limitations, risks, security and privacy concerns of performing an evaluation and management service by telephone and the availability of in person appointments. I also discussed with the patient that there may be a patient responsible charge related to this service. The patient expressed understanding and agreed to proceed.  Patient home Provider Office  History of Present Illness: He presents to establish care. He tested positive for COVID 10 days ago in the ED. He was asymptomatic and has not been vaccinated. He admits that he had one episode of cough. He did receive Remidesivir for 3 days. He was admitted for Hematochezia which suspected to be related to rectal fissure that has resolved.  He is most concern about this weakness and pain in his legs. This is thought to be related to ETOH abuse. He admits that he is improving and he is grateful. He is concern that the Gabapentin is causing more problems and is not very effective. He is :super groggy", "gassy," and "swimmy"  Review of Systems  Constitutional: Negative.  Negative for chills and fever.  HENT: Negative.  Negative for ear pain and sinus pain.        Seasonal allergies  Eyes: Negative.   Respiratory: Positive for shortness of breath (occasional ). Negative for cough (not really) and wheezing.   Cardiovascular: Positive for leg swelling. Negative for chest pain.  Gastrointestinal: Negative for blood in stool, constipation, diarrhea and nausea.       Rectal fissure has cleared  Genitourinary: Negative.   Musculoskeletal:       Pain, soreness and swelling.  This is from his knee caps into his toes.  PT was  effective in the hospital. He was able to walk on Friday after starting to walk.  He is 50 % better of  He is unable to set but so fast He continues to deal with pain and soreness The swelling has improved. This was from the knee down.   Skin: Negative.        Bumps on forehead and cheeks related to gabapentin  Neurological:       Numbness in toe and feet He has pins and needles. He has pain and numbness.  He has some burning with increased active   Endo/Heme/Allergies: Negative.    Observations/Objective: Virtual Visit no exam   Assessment and Plan: Assessment  Primary Diagnosis & Pertinent Problem List: The primary encounter diagnosis was Encounter to establish care. Diagnoses of Mild asthma without complication, unspecified whether persistent, History of COVID-19, Tobacco abuse counseling, Hereditary and idiopathic peripheral neuropathy, Anal fissure, Hematochezia, and Alcohol abuse were also pertinent to this visit.  Visit Diagnosis: 1. Encounter to establish care  Virtual visit will follow up in 4 weeks Discussed male health maintenance; self exams of breast and testicles  and annual exams. Discussed prostate age related concerns Discussed general safety in vehicle and COVID Discussed regular hydration with water Discussed healthy diet and exercise and weight management Discussed sexual health  Discussed mental health Encouraged to call our office for an appointment with in ongoing concerns for questions.     2. Mild asthma without complication, unspecified whether persistent  Stable on albuterol  3. History of COVID-19  Asymptomatic treated in hospital Encouraged COVID-19 vaccination patient, family and friends  Discussed precautionary measures due to the risk of reinfection; wearing proper fitting mask, avoiding handling the mask especially the outside without proper hand hygiene each time, washing hands often when coming in contact with known and unknown surfaces,  using sanitizer when handwashing stations are not available, social distancing, and encouraging family members to do the same.  Most importantly when you are sick and in close contact with others, all are considered exposed and should remain in quarantine for 5 to 14 days days. It is a case by case bases for quarantining.   If you have questions please contact our office via phone or MyChart.     4. Tobacco abuse counseling   5. Hereditary and idiopathic peripheral neuropathy  Persistent declined preglabin or Duloxetine   6. Anal fissure  Resolved   7. Hematochezia  Resolve  8. Alcohol abuse  Patient determined to no longer use alcohol    Follow Up Instructions: 4  weeks   I discussed the assessment and treatment plan with the patient. The patient was provided an opportunity to ask questions and all were answered. The patient agreed with the plan and demonstrated an understanding of the instructions.   The patient was advised to call back or seek an in-person evaluation if the symptoms worsen or if the condition fails to improve as anticipated.  I provided 29 minutes of video- face-to-face time during this encounter.   Barbette Merino, NP

## 2021-02-01 NOTE — Patient Instructions (Signed)
Neuropathic Arthropathy Neuropathic arthropathy is a condition that results from poor circulation and numbness in the foot and ankle. Poor blood supply and numbness can cause foot or ankle injuries that are too small to be noticed or treated (microtrauma). You may continue to walk on your damaged foot and make the condition worse. Poor circulation can also cause poor healing and bone weakness. Over time, this condition can lead to:  Broken bones.  Bone infection.  Joint damage or dislocation.  Deformities, such as a collapsed foot arch.  Disability of the foot or ankle. Neuropathic arthropathy is also called Charcot arthropathy or Charcot foot. What are the causes? This condition may be caused by any disease that damages nerves and blood vessels of the foot and ankle. Diabetes is the most common cause. In this case, neuropathic arthropathy may also be called diabetic foot.  Less common causes of this condition include:  Polio.  Leprosy.  Syphilis.  Spinal cord damage.  Long-term (chronic) alcoholism. What are the signs or symptoms? Early symptoms of this condition include:  Foot swelling without a known injury and with very little pain.  Warmth and redness. Later symptoms may include:  Foot deformity.  Ankle instability.  Foot sores (ulcers). How is this diagnosed? This condition may be diagnosed based on:  Your symptoms and medical history.  A physical exam.  Tests, such as: ? Blood tests to check for signs of infection or poorly controlled diabetes. ? X-rays of the ankle and foot to look for bone damage. ? Imaging tests to check for soft tissue or joint damage, such as an MRI or ultrasound. ? A bone scan to check for a bone infection. How is this treated? Treatment depends on the severity of the damage. If the damage is minor, treatment may include:  Wearing a brace, boot, or cast to protect and support the foot and ankle.  Using an assistive device such as a  walker, a knee walker, a wheelchair, or crutches to keep weight off the foot so it can heal. If you have a bone or soft tissue infection, you may receive antibiotics. More severe cases may require surgery to repair fractures, remove diseased bone or tissues, or reconstruct the foot or ankle. After treatment, you may need to wear a special cushioned shoe or boot for protection. Follow these instructions at home: If you have a brace, boot, or shoe:  Wear it as told by your health care provider. Remove it only as told by your health care provider.  Loosen it if your toes tingle, become numb, or turn cold and blue.  Keep it clean and dry. If you have a cast:  Do not put pressure on any part of the cast until it is fully hardened. This may take several hours.  Do not stick anything inside the cast to scratch your skin. Doing that increases your risk of infection.  Check the skin around the cast every day. Tell your health care provider about any concerns.  You may put lotion on dry skin around the edges of the cast. Do not put lotion on the skin underneath the cast.  Keep it clean and dry. Bathing If you have a cast, brace, boot, or shoe that is not waterproof:  Do not let it get wet.  Cover it with a watertight covering when you take a bath or shower. Activity  Return to your normal activities as told by your health care provider. Ask your health care provider what activities are   safe for you.  Avoid sitting for a long time without moving. Get up to take short walks every 1-2 hours. This is important to improve blood flow and breathing. Ask for help if you feel weak or unsteady.  Do not use the injured limb to support your body weight until your health care provider says that you can. Use a walker, a knee walker, a wheelchair, or crutches as told by your health care provider.   General instructions  Check your feet every day for any redness, warmth, swelling, or ulceration.  Do  not walk barefooted. This could lead to injuries to your foot. Wear well-fitted footwear.  Take over-the-counter and prescription medicines only as told by your health care provider.  If you were prescribed an antibiotic medicine, take it as told by your health care provider. Do not stop using the antibiotic even if you start to feel better.  Do not use any products that contain nicotine or tobacco, such as cigarettes, e-cigarettes, and chewing tobacco. If you need help quitting, ask your health care provider.  Keep all follow-up visits as told by your health care provider. This is important.      Contact a health care provider if you have:  A fever or chills.  Any new or worsening symptoms, such as redness, discharge, swelling, warmth, or ulcers on your foot.  Any problems with your cast, brace, or boot. Summary  Neuropathic arthropathy is a condition that results from poor circulation and numbness in the foot and ankle.  Poor blood supply and numbness can cause very small injuries (microtrauma) to your foot or ankle. Continuing to walk on your damaged foot can make the condition worse.  Any disease that damages nerves and blood vessels of the foot and ankle can cause neuropathic arthropathy. Diabetes is the most common cause.  Foot swelling without a known injury is the most common early symptom.  If the damage is minor, treatment may include wearing a brace, boot, or cast and using a walker, a knee walker, a wheelchair, or crutches. This information is not intended to replace advice given to you by your health care provider. Make sure you discuss any questions you have with your health care provider. Document Revised: 06/09/2019 Document Reviewed: 05/04/2019 Elsevier Patient Education  2021 Elsevier Inc.  

## 2021-02-27 LAB — PORPHYRINS, FRACTIONATION-PLASMA
Coproporphyrin.: <1 ug/dL (ref <1.0)
Heptacarboxyl Porphyrins: <1 ug/dL (ref <1.0)
Hexacarboxyl Porphyrins: <1.5 ug/dL (ref <1.5)
Pentacarboxyl Porphyrins: <1 ug/dL (ref <1.0)
Protoporphyrin: <1.5 ug/dL (ref <1.5)
Uroporphyrin: <1.5 ug/dL (ref <1.5)

## 2021-03-01 ENCOUNTER — Ambulatory Visit: Payer: Self-pay | Admitting: Nurse Practitioner

## 2021-03-05 ENCOUNTER — Ambulatory Visit: Payer: Self-pay | Admitting: Nurse Practitioner

## 2021-04-02 ENCOUNTER — Telehealth: Payer: Self-pay

## 2021-04-02 ENCOUNTER — Encounter: Payer: Self-pay | Admitting: Diagnostic Neuroimaging

## 2021-04-02 ENCOUNTER — Ambulatory Visit: Payer: MEDICAID | Admitting: Diagnostic Neuroimaging

## 2021-04-02 NOTE — Telephone Encounter (Signed)
Pt no showed 04/02/21 appt with Dr. Marjory Lies.

## 2021-11-30 ENCOUNTER — Other Ambulatory Visit: Payer: Self-pay

## 2021-11-30 ENCOUNTER — Emergency Department
Admission: EM | Admit: 2021-11-30 | Discharge: 2021-12-01 | Disposition: A | Discharge status: Home / Self Care | Payer: Self-pay | Attending: Family Medicine | Admitting: Family Medicine

## 2021-11-30 DIAGNOSIS — J45909 Unspecified asthma, uncomplicated: Secondary | ICD-10-CM | POA: Insufficient documentation

## 2021-11-30 DIAGNOSIS — K047 Periapical abscess without sinus: Secondary | ICD-10-CM | POA: Insufficient documentation

## 2021-11-30 DIAGNOSIS — F17210 Nicotine dependence, cigarettes, uncomplicated: Secondary | ICD-10-CM | POA: Insufficient documentation

## 2021-11-30 DIAGNOSIS — Z8616 Personal history of COVID-19: Secondary | ICD-10-CM | POA: Insufficient documentation

## 2021-11-30 MED ORDER — AMOXICILLIN 500 MG PO TABS: 500.0000 mg | ORAL_TABLET | Freq: Three times a day (TID) | ORAL | 0 refills | Status: DC

## 2021-11-30 NOTE — ED Triage Notes (Signed)
Pt comes with c/o right sided abscess/ dental pain. Pt states he noticed it last night and now has gotten worse.  Pt has swelling noted.

## 2021-11-30 NOTE — ED Provider Notes (Signed)
[facility] Emergency Department Provider Note ____________________________________________  Time seen: Approximately 5:47 PM  I have reviewed the triage vital signs and the nursing notes.   HISTORY  Chief Complaint Cough   HPI Shane Holmes is a 35 y.o. male presents to the emergency department for treatment and evaluation of dental pain and facial swelling. Pain started yesterday and worsened overnight and during work today.    Past Medical History:  Diagnosis Date   Asthma    Back pain     Patient Active Problem List   Diagnosis Date Noted   History of COVID-19 01/29/2021   Weakness of both lower extremities    Hereditary and idiopathic peripheral neuropathy    Dyschezia    Anal fissure    Hematochezia    Alcohol abuse    Acute blood loss anemia    Lower GI bleed 01/22/2021   Alcohol withdrawal syndrome with complication, with unspecified complication (HCC) 01/22/2021   COVID-19 virus infection 01/22/2021   Acute asthma exacerbation 01/22/2021   Hypokalemia 01/22/2021   Tobacco abuse 01/22/2021   Hyponatremia 01/22/2021   Dehydration 01/22/2021    No past surgical history on file.  Prior to Admission medications   Medication Sig Start Date End Date Taking? Authorizing Provider  amoxicillin (AMOXIL) 500 MG tablet Take 1 tablet (500 mg total) by mouth 3 (three) times daily. 11/30/21  Yes Starasia Sinko B, FNP  albuterol (VENTOLIN HFA) 108 (90 Base) MCG/ACT inhaler Inhale 2 puffs into the lungs every 6 (six) hours as needed for wheezing or shortness of breath. 01/26/21   Ghimire, Werner Lean, MD  albuterol (VENTOLIN HFA) 108 (90 Base) MCG/ACT inhaler INHALE 2 PUFFS INTO THE LUNGS EVERY SIX HOURS AS NEEDED FOR WHEEZING OR SHORTNESS OF BREATH. 01/26/21 01/26/22  Ghimire, Werner Lean, MD  capsaicin (ZOSTRIX) 0.025 % cream Apply topically 2 (two) times daily. Apply to bilateral feet to knees bid after placing lidocaine cream 01/26/21   Ghimire, Werner Lean,  MD  capsaicin (ZOSTRIX) 0.025 % cream APPLY TOPICALLY TWO TIMES DAILY. APPLY TO BILATERAL FEET TO KNEES TWICE DAILY AFTER PLACING LIDOCAINE CREAM 01/26/21 01/26/22  Ghimire, Werner Lean, MD  folic acid (FOLVITE) 1 MG tablet Take 1 tablet (1 mg total) by mouth daily. 01/26/21   Ghimire, Werner Lean, MD  folic acid (FOLVITE) 1 MG tablet TAKE 1 TABLET (1 MG TOTAL) BY MOUTH DAILY. 01/26/21 01/26/22  Ghimire, Werner Lean, MD  gabapentin (NEURONTIN) 400 MG capsule Take 1 capsule (400 mg total) by mouth 3 (three) times daily. 01/26/21   Ghimire, Werner Lean, MD  gabapentin (NEURONTIN) 400 MG capsule TAKE 1 CAPSULE (400 MG TOTAL) BY MOUTH THREE TIMES DAILY. 01/26/21 01/26/22  Maretta Bees, MD  Multiple Vitamin (MULTIVITAMIN WITH MINERALS) TABS tablet Take 1 tablet by mouth daily. 01/27/21   Ghimire, Werner Lean, MD  polyethylene glycol powder (GLYCOLAX/MIRALAX) 17 GM/SCOOP powder DISSOLVE 1 CAPFUL IN WATER AND TAKE BY MOUTH DAILY 01/26/21 01/26/22  Ghimire, Werner Lean, MD  thiamine 100 MG tablet Take 1 tablet (100 mg total) by mouth daily. 01/27/21   Ghimire, Werner Lean, MD  thiamine 100 MG tablet TAKE 1 TABLET (100 MG TOTAL) BY MOUTH DAILY. 01/26/21 01/26/22  Ghimire, Werner Lean, MD  vitamin B-12 (CYANOCOBALAMIN) 1000 MCG tablet Take 1 tablet (1,000 mcg total) by mouth daily. 01/26/21   Ghimire, Werner Lean, MD  vitamin B-12 (CYANOCOBALAMIN) 1000 MCG tablet TAKE 1 TABLET (1,000 MCG TOTAL) BY MOUTH DAILY. 01/26/21 01/26/22  Ghimire, Werner Lean,  MD  dicyclomine (BENTYL) 20 MG tablet Take 1 tablet (20 mg total) by mouth 2 (two) times daily. Patient not taking: Reported on 11/02/2019 03/02/14 11/03/19  [name], PA-C    Allergies Lidocaine-glycerin  Family History  Problem Relation Age of Onset   Cirrhosis Mother    Cirrhosis Father    Diabetes Neg Hx    Hypertension Neg Hx     Social History Social History   Tobacco Use   Smoking status: Some Days    Types: Cigarettes   Smokeless tobacco: Never  Vaping Use   Vaping  Use: Never used  Substance Use Topics   Alcohol use: Not Currently    Alcohol/week: 6.0 standard drinks    Types: 6 Cans of beer per week   Drug use: No    Review of Systems Constitutional: Negative for fever or recent illness. ENT: Positive for dental pain. Musculoskeletal: Negative for trismus of the jaw.  Skin: Negative for wound or lesion. ____________________________________________   PHYSICAL EXAM:  VITAL SIGNS: ED Triage Vitals  Enc Vitals Group     BP 11/30/21 1746 (!) 140/97     Pulse Rate 11/30/21 1746 (!) 106     Resp 11/30/21 1746 15     Temp 11/30/21 1746 98.6 F (37 C)     Temp Source 11/30/21 1746 Oral     SpO2 11/30/21 1746 100 %     Weight --      Height --      Head Circumference --      Peak Flow --      Pain Score 11/30/21 1743 8     Pain Loc --      Pain Edu? --      Excl. in GC? --     Constitutional: Alert and oriented. Well appearing and in no acute distress. Eyes: Conjunctiva are clear without discharge or drainage. Mouth/Throat: Right side facial swelling. Sublingual surface soft. Periodontal Exam Hematological/Lymphatic/Immunilogical: No palpable adenopathy. Respiratory: Respirations even and unlabored. Musculoskeletal: Full ROM of the jaw. Neurologic: Awake, alert, oriented.  Skin:  Right side facial swelling Psychiatric: Affect and behavior intact.  ____________________________________________   LABS (all labs ordered are listed, but only abnormal results are displayed)  Labs Reviewed - No data to display ____________________________________________   RADIOLOGY  Not indicated. ____________________________________________   PROCEDURES  Procedure(s) performed:   Procedures  Critical Care performed: No ____________________________________________   INITIAL IMPRESSION / ASSESSMENT AND PLAN / ED COURSE  Shane Holmes is a 35 y.o. male presents to the emergency department for treatment and evaluation of dental pain and  facial swelling. See HPI.   Will treat with Amoxicillin and have him follow up with the dentist within 2 weeks. Advised to return to the ER for symptoms that change or worsen if unable to schedule an appointment.  Pertinent labs & imaging results that were available during my care of the patient were reviewed by me and considered in my medical decision making (see chart for details).  ____________________________________________   FINAL CLINICAL IMPRESSION(S) / ED DIAGNOSES  Final diagnoses:  Dental abscess    Discharge Medication List as of 11/30/2021  5:59 PM     START taking these medications   Details  amoxicillin (AMOXIL) 500 MG tablet Take 1 tablet (500 mg total) by mouth 3 (three) times daily., Starting Fri 11/30/2021, Normal        If controlled substance prescribed during this visit, 12 month history viewed on the [facility] prior to  issuing an initial prescription for Schedule II or III opiod.  Note:  This document was prepared using Dragon voice recognition software and may include unintentional dictation errors.    Chinita Pester, FNP 12/01/21 2137    Minna Antis, MD 12/03/21 1221

## 2021-11-30 NOTE — Discharge Instructions (Signed)
Please call and schedule a dental appointment as soon as possible. You will need to be seen within the next 14 days. Return to the emergency department for symptoms that change or worsen if you're unable to schedule an appointment.  OPTIONS FOR DENTAL FOLLOW UP CARE  [place] Department of Health and Human Services - Local Safety Net Dental Clinics http://www.ncdhhs.gov/dph/oralhealth/services/safetynetclinics.htm   [facility] ([phone])  [facility] ([phone])  [facility] ([phone])  [facility] ([phone])  [facility] ([phone]) This clinic caters to the indigent population and is on a lottery system. Location: [facility], [address], [place] Clinic Hours: Wednesdays from 6pm - 9pm, patients seen by a lottery system. For dates, call or go to [email or web address].[facility].edu/[facility]/patients/[email or web address]-[facility] Services: Cleanings, fillings and simple extractions. Payment Options: DENTAL WORK IS FREE OF CHARGE. Bring proof of income or support. Best way to get seen: Arrive at 5:15 pm - this is a lottery, NOT first come/first serve, so arriving earlier will not increase your chances of being seen.     [facility] [phone] Select option 1 for emergencies   Location: [facility], [address], [place] Clinic Hours: No walk-ins accepted - call the day before to schedule an appointment. Check in times are 9:30 am and 1:30 pm. Services: Simple extractions, temporary fillings, pulpectomy/pulp debridement, uncomplicated abscess drainage. Payment Options: PAYMENT IS DUE AT THE TIME OF SERVICE.  Fee is usually $100-200, additional surgical procedures (e.g. abscess drainage) may be extra. Cash, checks, Visa/MasterCard accepted.  Can file Medicaid if patient is covered for dental - patient should call case worker to check. No  discount for [facility] patients. Best way to get seen: MUST call the day before and get onto the schedule. Can usually be seen the next 1-2 days. No walk-ins accepted.     [facility] [phone]   Location: [facility], [address], [place] Clinic Hours: M, W, Th, F 8am or 1:30pm, Tues 9a or 1:30 - first come/first served. Services: Simple extractions, temporary fillings, uncomplicated abscess drainage.  You do not need to be an [place] resident. Payment Options: PAYMENT IS DUE AT THE TIME OF SERVICE. Dental insurance, otherwise sliding scale - bring proof of income or support. Depending on income and treatment needed, cost is usually $50-200. Best way to get seen: Arrive early as it is first come/first served.     [facility] [phone]   Location: [address] Clinic Hours: Mon-Thu 8a-5p Services: Most basic dental services including extractions and fillings. Payment Options: PAYMENT IS DUE AT THE TIME OF SERVICE. Sliding scale, up to 50% off - bring proof if income or support. Medicaid with dental option accepted. Best way to get seen: Call to schedule an appointment, can usually be seen within 2 weeks OR they will try to see walk-ins - show up at 8a or 2p (you may have to wait).     [facility] [phone] [place] RESIDENTS ONLY   Location: [facility], [address], [place], [place] [address] Clinic Hours: By appointment only. Monday - Thursday 8am-5pm, Friday 8am-12pm Services: Cleanings, fillings, extractions. Payment Options: PAYMENT IS DUE AT THE TIME OF SERVICE. Cash, Visa or MasterCard. Sliding scale - $30 minimum per service. Best way to get seen: Come in to office, complete packet and make an appointment -   need proof of income or support monies for each household member and proof of [place]  residence. Usually takes about a month to get in.     [facility] [phone]   Location: [address] [place] Clinic Hours: Walk-in Urgent Care Dental Services are offered Monday-Friday mornings only. The numbers of emergencies accepted daily is limited to the number of providers available. Maximum 15 - Mondays, Wednesdays & Thursdays Maximum 10 - Tuesdays & Fridays Services: You do not need to be a [place] resident to be seen for a dental emergency. Emergencies are defined as pain, swelling, abnormal bleeding, or dental trauma. Walkins will receive x-rays if needed. NOTE: Dental cleaning is not an emergency. Payment Options: PAYMENT IS DUE AT THE TIME OF SERVICE. Minimum co-pay is $40.00 for uninsured patients. Minimum co-pay is $3.00 for Medicaid with dental coverage. Dental Insurance is accepted and must be presented at time of visit. Medicare does not cover dental. Forms of payment: Cash, credit card, checks. Best way to get seen: If not previously registered with the clinic, walk-in dental registration begins at 7:15 am and is on a first come/first serve basis. If previously registered with the clinic, call to make an appointment.     [facility] [phone] [place] RESIDENTS ONLY   Location: [address], [place], [place] Clinic Hours: Mon-Thu 10a-2p Services: Extractions only! Payment Options: FREE (donations accepted) - bring proof of income or support Best way to get seen: Call and schedule an appointment OR come at 8am on the 1st Monday of every month (except for holidays) when it is first come/first served.     [facility] [phone]   Location: [address], [place] Clinic Hours: Friday mornings Services, Payment Options, Best way to get seen: Call for info  

## 2022-01-09 ENCOUNTER — Encounter (HOSPITAL_COMMUNITY): Payer: Self-pay | Admitting: *Deleted

## 2022-01-09 ENCOUNTER — Emergency Department (HOSPITAL_COMMUNITY): Payer: Self-pay

## 2022-01-09 ENCOUNTER — Emergency Department (HOSPITAL_COMMUNITY)
Admission: EM | Admit: 2022-01-09 | Discharge: 2022-01-10 | Disposition: A | Discharge status: Home / Self Care | Payer: Self-pay | Attending: Emergency Medicine | Admitting: Emergency Medicine

## 2022-01-09 ENCOUNTER — Other Ambulatory Visit: Payer: Self-pay

## 2022-01-09 DIAGNOSIS — I510 Cardiac septal defect, acquired: Secondary | ICD-10-CM | POA: Insufficient documentation

## 2022-01-09 DIAGNOSIS — R531 Weakness: Secondary | ICD-10-CM | POA: Insufficient documentation

## 2022-01-09 DIAGNOSIS — Z20822 Contact with and (suspected) exposure to covid-19: Secondary | ICD-10-CM | POA: Insufficient documentation

## 2022-01-09 DIAGNOSIS — G0491 Myelitis, unspecified: Secondary | ICD-10-CM

## 2022-01-09 DIAGNOSIS — Z79899 Other long term (current) drug therapy: Secondary | ICD-10-CM | POA: Insufficient documentation

## 2022-01-09 HISTORY — DX: Other intervertebral disc degeneration, lumbar region: M51.36

## 2022-01-09 HISTORY — DX: Other intervertebral disc degeneration, lumbar region without mention of lumbar back pain or lower extremity pain: M51.369

## 2022-01-09 HISTORY — DX: Polyneuropathy, unspecified: G62.9

## 2022-01-09 LAB — DIFFERENTIAL
Abs Immature Granulocytes: 0.03 10*3/uL (ref 0.00–0.07)
Basophils Absolute: 0 10*3/uL (ref 0.0–0.1)
Basophils Relative: 1 %
Eosinophils Absolute: 0.1 10*3/uL (ref 0.0–0.5)
Eosinophils Relative: 1 %
Immature Granulocytes: 1 %
Lymphocytes Relative: 26 %
Lymphs Abs: 1.7 10*3/uL (ref 0.7–4.0)
Monocytes Absolute: 0.5 10*3/uL (ref 0.1–1.0)
Monocytes Relative: 7 %
Neutro Abs: 4.3 10*3/uL (ref 1.7–7.7)
Neutrophils Relative %: 64 %

## 2022-01-09 LAB — PROTIME-INR
INR: 1.1 (ref 0.8–1.2)
Prothrombin Time: 14.4 seconds (ref 11.4–15.2)

## 2022-01-09 LAB — COMPREHENSIVE METABOLIC PANEL
ALT: 47 U/L — ABNORMAL HIGH (ref 0–44)
AST: 122 U/L — ABNORMAL HIGH (ref 15–41)
Albumin: 3.7 g/dL (ref 3.5–5.0)
Alkaline Phosphatase: 124 U/L (ref 38–126)
Anion gap: 16 — ABNORMAL HIGH (ref 5–15)
BUN: 9 mg/dL (ref 6–20)
CO2: 19 mmol/L — ABNORMAL LOW (ref 22–32)
Calcium: 8.9 mg/dL (ref 8.9–10.3)
Chloride: 98 mmol/L (ref 98–111)
Creatinine, Ser: 0.72 mg/dL (ref 0.61–1.24)
GFR, Estimated: >60 mL/min (ref >60)
Glucose, Bld: 299 mg/dL — ABNORMAL HIGH (ref 70–99)
Potassium: 3.4 mmol/L — ABNORMAL LOW (ref 3.5–5.1)
Sodium: 133 mmol/L — ABNORMAL LOW (ref 135–145)
Total Bilirubin: 0.9 mg/dL (ref 0.3–1.2)
Total Protein: 7 g/dL (ref 6.5–8.1)

## 2022-01-09 LAB — CBC
HCT: 40.1 % (ref 39.0–52.0)
Hemoglobin: 13.4 g/dL (ref 13.0–17.0)
MCH: 30.2 pg (ref 26.0–34.0)
MCHC: 33.4 g/dL (ref 30.0–36.0)
MCV: 90.3 fL (ref 80.0–100.0)
Platelets: 140 10*3/uL — ABNORMAL LOW (ref 150–400)
RBC: 4.44 MIL/uL (ref 4.22–5.81)
RDW: 13.5 % (ref 11.5–15.5)
WBC: 6.5 10*3/uL (ref 4.0–10.5)
nRBC: 0 % (ref 0.0–0.2)

## 2022-01-09 LAB — URINALYSIS, ROUTINE W REFLEX MICROSCOPIC
Bacteria, UA: NONE SEEN
Bilirubin Urine: NEGATIVE
Glucose, UA: >=500 mg/dL — AB
Ketones, ur: 20 mg/dL — AB
Leukocytes,Ua: NEGATIVE
Nitrite: NEGATIVE
Protein, ur: NEGATIVE mg/dL
Specific Gravity, Urine: 1.04 — ABNORMAL HIGH (ref 1.005–1.030)
pH: 5 (ref 5.0–8.0)

## 2022-01-09 LAB — RESP PANEL BY RT-PCR (FLU A&B, COVID) ARPGX2
Influenza A by PCR: NEGATIVE
Influenza B by PCR: NEGATIVE
SARS Coronavirus 2 by RT PCR: NEGATIVE

## 2022-01-09 LAB — I-STAT CHEM 8, ED
BUN: 9 mg/dL (ref 6–20)
Calcium, Ion: 1.09 mmol/L — ABNORMAL LOW (ref 1.15–1.40)
Chloride: 99 mmol/L (ref 98–111)
Creatinine, Ser: 0.9 mg/dL (ref 0.61–1.24)
Glucose, Bld: 292 mg/dL — ABNORMAL HIGH (ref 70–99)
HCT: 44 % (ref 39.0–52.0)
Hemoglobin: 15 g/dL (ref 13.0–17.0)
Potassium: 3.4 mmol/L — ABNORMAL LOW (ref 3.5–5.1)
Sodium: 138 mmol/L (ref 135–145)
TCO2: 24 mmol/L (ref 22–32)

## 2022-01-09 LAB — RAPID URINE DRUG SCREEN, HOSP PERFORMED
Amphetamines: NOT DETECTED
Barbiturates: NOT DETECTED
Benzodiazepines: NOT DETECTED
Cocaine: POSITIVE — AB
Opiates: NOT DETECTED
Tetrahydrocannabinol: NOT DETECTED

## 2022-01-09 LAB — ETHANOL: Alcohol, Ethyl (B): 274 mg/dL — ABNORMAL HIGH (ref <10)

## 2022-01-09 LAB — APTT: aPTT: 30 seconds (ref 24–36)

## 2022-01-09 MED ORDER — FENTANYL CITRATE PF 50 MCG/ML IJ SOSY
50.0000 ug | PREFILLED_SYRINGE | Freq: Once | INTRAMUSCULAR | Status: AC
  Administered 2022-01-09: 50 ug via INTRAVENOUS
  Filled 2022-01-09: qty 1

## 2022-01-09 MED ORDER — LORAZEPAM 2 MG/ML IJ SOLN
1.0000 mg | Freq: Once | INTRAMUSCULAR | Status: AC
  Administered 2022-01-09: 1 mg via INTRAVENOUS
  Filled 2022-01-09: qty 1

## 2022-01-09 MED ORDER — FENTANYL CITRATE PF 50 MCG/ML IJ SOSY: 12.5000 ug | PREFILLED_SYRINGE | Freq: Once | INTRAMUSCULAR | Status: DC

## 2022-01-09 NOTE — ED Notes (Signed)
Pt remains in MRI - will update vitals when pt returns

## 2022-01-09 NOTE — Consult Note (Signed)
NEURO HOSPITALIST CONSULT NOTE   Requestig physician: Dr. Langston Masker  Reason for Consult: Acute onset of RUE and BLE weakness  History obtained from:  Patient and Chart     HPI:                                                                                                                                          Shane Holmes is an 36 y.o. male with a PMHx of back pain, bulging lumbar disc, asthma and neuropathy, who presents to the ED from work for assessment of acute onset BLE numbness and weakness, in conjunction with RUE weakness and numbness. The patient states that he has had one prior episode of similar symptoms which were worked up at [facility] about 1 year ago, followed by spontaneous resolution. He states that he was diagnosed with a neuropathy involving his BLE.   The patient states that he was in his US[employer] at work today and that at the end of his shift he got into his truck without any problems. While in the truck he experienced acute onset of numbness and weakness involving his entire RUE to the shoulder; this was noticed when he tried to turn the key in the ignition with his right hand. He then noticed right lateral abdominal numbness as well as numbness with flaccid weakness of the entirety of both lower extremities from his hips to his feet. He called for help and when FD arrived, they had to lift him out of the cab of his truck to a stretcher. He was then transported to the [facility] ED.   He denies bowel or bladder incontinence, no recent urinary retention or constipation, no vision changes, no confusion or speech deficit, no CP, no SOB, no abdominal pain, no saddle anesthesia or pain and no fevers. He endorses a mild headache as well as chronic back pain, with no recent changes to the latter. No recent trauma. No prodromal symptoms prior to onset of his weakness.   Chart review reveals information regarding his last admission for similar symptoms: Dr. Johny Chess note describes LP  findings at that time which were benign. 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 Some muscle inflammation had also been seen on L-spine MRI. Excerpt from note dated 01/26/21 is as follows: "36 yo male with a presentation resembling GBS, however his CSF testing is not consistent with this. Consider other types of neuropathy including ETOH neuropathy with improved strength on assessment 01/26/2021 and subjective improvement in pain. MRI with myopathy vs inflammation in a localized area at L4-5-possibly secondary to the LP that he had had just before MRI. - DTRs absent in lower extremities with subjective improvement in painful  neuropathy in bilateral feet, numbness, and strength in bilateral lower extremities. - CSF without evidence supporting GBS diagnosis - Patient with history of alcohol abuse, at least 12 beers daily, and history of DTs with alcohol withdrawal. - Further nephropathy work-up ordered/pending: heavy metals serum and urine, thyroid function tests, ANA, ESR, urine/blood for porphyrins, RA, Sjogren testing, Anti Hu antibodies, Methylmalonic acid, homocysteine levels. Also [facility] send outs-antimyelin associated glycoprotein and Anti GM1 antibodies." Outpatient follow up had been recommended at that time.     Past Medical History:  Diagnosis Date   Asthma    Back pain    Bulging lumbar disc    Neuropathy     History reviewed. No pertinent surgical history.  Family History  Problem Relation Age of Onset   Cirrhosis Mother    Cirrhosis Father    Diabetes Neg Hx    Hypertension Neg Hx               Social History:  reports that he has been smoking cigarettes. He has never used smokeless tobacco. He reports that he does not currently use alcohol after a past usage of about 6.0 standard drinks per week. He reports that he does not use drugs.  Allergies  Allergen Reactions   Lidocaine-Glycerin  Other (See Comments)    HOME MEDICATIONS:                                                                                                                      No current facility-administered medications on file prior to encounter.   Current Outpatient Medications on File Prior to Encounter  Medication Sig Dispense Refill   albuterol (VENTOLIN HFA) 108 (90 Base) MCG/ACT inhaler Inhale 2 puffs into the lungs every 6 (six) hours as needed for wheezing or shortness of breath. 6.7 g 0   albuterol (VENTOLIN HFA) 108 (90 Base) MCG/ACT inhaler INHALE 2 PUFFS INTO THE LUNGS EVERY SIX HOURS AS NEEDED FOR WHEEZING OR SHORTNESS OF BREATH. 18 g 0   amoxicillin (AMOXIL) 500 MG tablet Take 1 tablet (500 mg total) by mouth 3 (three) times daily. 30 tablet 0   capsaicin (ZOSTRIX) 0.025 % cream Apply topically 2 (two) times daily. Apply to bilateral feet to knees bid after placing lidocaine cream 60 g 0   capsaicin (ZOSTRIX) 0.025 % cream APPLY TOPICALLY TWO TIMES DAILY. APPLY TO BILATERAL FEET TO KNEES TWICE DAILY AFTER PLACING LIDOCAINE CREAM 60 g 0   folic acid (FOLVITE) 1 MG tablet Take 1 tablet (1 mg total) by mouth daily. 30 tablet 0   folic acid (FOLVITE) 1 MG tablet TAKE 1 TABLET (1 MG TOTAL) BY MOUTH DAILY. 30 tablet 0   gabapentin (NEURONTIN) 400 MG capsule Take 1 capsule (400 mg total) by mouth 3 (three) times daily. 90 capsule 0   gabapentin (NEURONTIN) 400 MG capsule TAKE 1 CAPSULE (400 MG TOTAL) BY MOUTH THREE TIMES DAILY. 90 capsule 0   Multiple Vitamin (MULTIVITAMIN WITH MINERALS) TABS tablet Take 1 tablet  by mouth daily. 30 tablet 0   polyethylene glycol powder (GLYCOLAX/MIRALAX) 17 GM/SCOOP powder DISSOLVE 1 CAPFUL IN WATER AND TAKE BY MOUTH DAILY 510 g 0   thiamine 100 MG tablet Take 1 tablet (100 mg total) by mouth daily. 30 tablet 0   thiamine 100 MG tablet TAKE 1 TABLET (100 MG TOTAL) BY MOUTH DAILY. 30 tablet 0   vitamin B-12 (CYANOCOBALAMIN) 1000 MCG tablet Take 1 tablet (1,000 mcg  total) by mouth daily. 30 tablet 0   vitamin B-12 (CYANOCOBALAMIN) 1000 MCG tablet TAKE 1 TABLET (1,000 MCG TOTAL) BY MOUTH DAILY. 30 tablet 0   [DISCONTINUED] dicyclomine (BENTYL) 20 MG tablet Take 1 tablet (20 mg total) by mouth 2 (two) times daily. (Patient not taking: Reported on 11/02/2019) 20 tablet 0     ROS:                                                                                                                                       As per HPI.    Blood pressure 129/87, pulse (!) 125, temperature 98.2 F (36.8 C), temperature source Oral, resp. rate 17, SpO2 92 %.   General Examination:                                                                                                       Physical Exam  HEENT-  [place]/AT   Lungs- Respirations unlabored Abdomen- Round.  Extremities- Warm and well perfused   Neurological Examination Mental Status: Awake, alert and oriented. Speech fluent without evidence of aphasia.  Able to demonstrate comprehension of all commands without difficulty. Cranial Nerves: II: Temporal visual fields intact with no extinction to DSS. PERRL.   III,IV, VI: No ptosis. EOMI. No nystagmus.  V: Temp sensation equal bilaterally VII: Smile symmetric VIII: Hearing intact to voice IX,X: No hypophonia or hoarseness XI: Head is midline.  XII: Midline tongue extension Motor: RUE with normal tone. Can elevate forearm antigravity, but not upper arm. Grip strength 3-4/5 with and effort-dependent component. Biceps and triceps 3-4/5 with some variation in force exerted suggestive of effort dependence.  LUE 5/5 proximally and distally BLE with no movement to commands or to any stimuli, but with normal bulk and tone. Of note, feet and knees are positioned relative to bed most consistent with normal strength (at rest, feet and knees are at approximately 45 degree angles relative to the horizontal) Sensory: Subjective severe loss of FT sensation to RUE, but present.  States no  temperature sensation when RUE tested proximally and distally. Subjectively with no sensation to FT or pressure BLE proximally and distally; sensation normalizes above hip on left and at mid-thoracic level on the right. No responses to noxious plantar stimulation bilaterally, stating he cannot feel the stimuli.  Deep Tendon Reflexes: 2+ bilateral brachioradialis and biceps. 0 bilateral patellae and achilles. Toes mute bilaterally  Cerebellar: No ataxia with FNF on the left.  Gait: Unable to assess   Lab Results: Basic Metabolic Panel: Recent Labs  Lab 01/09/22 2013  NA 133*  K 3.4*  CL 98  CO2 19*  GLUCOSE 299*  BUN 9  CREATININE 0.72  CALCIUM 8.9    CBC: Recent Labs  Lab 01/09/22 2013  WBC 6.5  NEUTROABS 4.3  HGB 13.4  HCT 40.1  MCV 90.3  PLT 140*    Cardiac Enzymes: No results for input(s): CKTOTAL, CKMB, CKMBINDEX, TROPONINI in the last 168 hours.  Lipid Panel: No results for input(s): CHOL, TRIG, HDL, CHOLHDL, VLDL, LDLCALC in the last 168 hours.  Imaging: CT HEAD CODE STROKE WO CONTRAST  Result Date: 01/09/2022 CLINICAL DATA:  Code stroke. Initial evaluation for neuro deficit, stroke suspected. EXAM: CT HEAD WITHOUT CONTRAST TECHNIQUE: Contiguous axial images were obtained from the base of the skull through the vertex without intravenous contrast. RADIATION DOSE REDUCTION: This exam was performed according to the departmental dose-optimization program which includes automated exposure control, adjustment of the mA and/or kV according to patient size and/or use of iterative reconstruction technique. COMPARISON:  None. FINDINGS: Brain: Cerebral volume within normal limits for patient age. No evidence for acute intracranial hemorrhage. No findings to suggest acute large vessel territory infarct. No mass lesion, midline shift, or mass effect. Ventricles are normal in size without evidence for hydrocephalus. No extra-axial fluid collection identified. Vascular: No  hyperdense vessel identified. Skull: Scalp soft tissues demonstrate no acute abnormality. Calvarium intact. Sinuses/Orbits: Globes and orbital soft tissues within normal limits. Mild scattered mucosal thickening noted within the ethmoidal air cells. Paranasal sinuses are otherwise largely clear. No mastoid effusion. ASPECTS [facility] Stroke Program Early CT Score) - Ganglionic level infarction (caudate, lentiform nuclei, internal capsule, insula, M1-M3 cortex): 7 - Supraganglionic infarction (M4-M6 cortex): 3 Total score (0-10 with 10 being normal): 10 IMPRESSION: 1. Negative head CT.  No acute intracranial abnormality. 2. ASPECTS is 10. These results were communicated to Dr. Cheral Marker at 8:35 pm on 01/09/2022 by text page via the [facility] messaging system. Electronically Signed   By: Jeannine Boga M.D.   On: 01/09/2022 20:37     Assessment: 36 y.o. male with a PMHx of back pain, bulging lumbar disc, asthma and neuropathy, who presents to the ED from work for assessment of acute onset BLE numbness and weakness, in conjunction with RUE weakness and numbness. The patient states that he has had one prior episode of similar symptoms which were worked up at [facility] about 1 year ago, followed by spontaneous resolution. He states that he was diagnosed with a neuropathy involving his BLE.  1. Exam reveals findings suggestive of functional weakness, as documented above. DDx also includes acute transverse myelitis and cord compression. GBS would not present with sudden weakness. No pain, saddle anesthesia, urinary or bowel incontinence to suggest an acute conus medullaris or cauda equina syndrome. Although flaccid BLE weakness can be seen with acute spinal cord infarction, the lack of vascular risk factors, atypical age, lack of bowel/bladder symptoms, lack of autonomic symptoms militate against this component of the DDx. Additionally, sensory  loss with a spinal cord infarction would be expected to be less severe than with the  current pattern of subjective complete sensory loss endorsed by the patient. 2. Also suggestive of a nonorganic etiology is the combination of RUE deficits with BLE deficits, which are difficult to localize.    Recommendations: 1. STAT MRI of cervical and thoracic spine. Will obtain without contrast initially so as not to impair additional possible scans of brain and L-spine if needed.  2. Further recommendations pending MRI results  Addendum:  - The patient refused to comply with requirements to stay still for MRI, despite Ativan premedication - Fentanyl 50 mcg IV has been ordered by EDP for second attempt at MRI - Patient also has been threatening RN to leave AMA   Electronically signed: Dr. Kerney Elbe 01/09/2022, 8:51 PM

## 2022-01-09 NOTE — ED Provider Notes (Signed)
[facility] EMERGENCY DEPARTMENT Provider Note   CSN: [id number] Arrival date & time: 01/09/22  1945  An emergency department physician performed an initial assessment on this suspected stroke patient at (S) 2017.  History  CC: Weakness   Shane Holmes is a 36 y.o. male presenting to E dwith acute onset weakness, 530 pm today, reports he lost function in his right arm, right leg, and left leg, cannot feel anything, can wiggle his right finger only.  Left arm completely unaffected.  No headache, neck pain, back injuries or spinal surgeries.    No fevers, chills.  Reports frequent etoh use, daily, including today, and hx of withdrawal symptoms as well.  Hospitalized 1 year ago in Feb, had neuro w/u for LE weakness and paresthesias, had unremarkable LP and MRI L spine with no significant spinal lesions or cord compression at the time.   HPI     Home Medications Prior to Admission medications   Medication Sig Start Date End Date Taking? Authorizing Provider  albuterol (VENTOLIN HFA) 108 (90 Base) MCG/ACT inhaler Inhale 2 puffs into the lungs every 6 (six) hours as needed for wheezing or shortness of breath. 01/26/21   Ghimire, Werner Lean, MD  albuterol (VENTOLIN HFA) 108 (90 Base) MCG/ACT inhaler INHALE 2 PUFFS INTO THE LUNGS EVERY SIX HOURS AS NEEDED FOR WHEEZING OR SHORTNESS OF BREATH. 01/26/21 01/26/22  Ghimire, Werner Lean, MD  amoxicillin (AMOXIL) 500 MG tablet Take 1 tablet (500 mg total) by mouth 3 (three) times daily. 11/30/21   Triplett, Cari B, FNP  capsaicin (ZOSTRIX) 0.025 % cream Apply topically 2 (two) times daily. Apply to bilateral feet to knees bid after placing lidocaine cream 01/26/21   Ghimire, Werner Lean, MD  capsaicin (ZOSTRIX) 0.025 % cream APPLY TOPICALLY TWO TIMES DAILY. APPLY TO BILATERAL FEET TO KNEES TWICE DAILY AFTER PLACING LIDOCAINE CREAM 01/26/21 01/26/22  Ghimire, Werner Lean, MD  folic acid (FOLVITE) 1 MG tablet Take 1 tablet (1 mg total) by mouth  daily. 01/26/21   Ghimire, Werner Lean, MD  folic acid (FOLVITE) 1 MG tablet TAKE 1 TABLET (1 MG TOTAL) BY MOUTH DAILY. 01/26/21 01/26/22  Ghimire, Werner Lean, MD  gabapentin (NEURONTIN) 400 MG capsule Take 1 capsule (400 mg total) by mouth 3 (three) times daily. 01/26/21   Ghimire, Werner Lean, MD  gabapentin (NEURONTIN) 400 MG capsule TAKE 1 CAPSULE (400 MG TOTAL) BY MOUTH THREE TIMES DAILY. 01/26/21 01/26/22  Maretta Bees, MD  Multiple Vitamin (MULTIVITAMIN WITH MINERALS) TABS tablet Take 1 tablet by mouth daily. 01/27/21   Ghimire, Werner Lean, MD  polyethylene glycol powder (GLYCOLAX/MIRALAX) 17 GM/SCOOP powder DISSOLVE 1 CAPFUL IN WATER AND TAKE BY MOUTH DAILY 01/26/21 01/26/22  Ghimire, Werner Lean, MD  thiamine 100 MG tablet Take 1 tablet (100 mg total) by mouth daily. 01/27/21   Ghimire, Werner Lean, MD  thiamine 100 MG tablet TAKE 1 TABLET (100 MG TOTAL) BY MOUTH DAILY. 01/26/21 01/26/22  Ghimire, Werner Lean, MD  vitamin B-12 (CYANOCOBALAMIN) 1000 MCG tablet Take 1 tablet (1,000 mcg total) by mouth daily. 01/26/21   Ghimire, Werner Lean, MD  vitamin B-12 (CYANOCOBALAMIN) 1000 MCG tablet TAKE 1 TABLET (1,000 MCG TOTAL) BY MOUTH DAILY. 01/26/21 01/26/22  Ghimire, Werner Lean, MD  dicyclomine (BENTYL) 20 MG tablet Take 1 tablet (20 mg total) by mouth 2 (two) times daily. Patient not taking: Reported on 11/02/2019 03/02/14 11/03/19  [name], PA-C      Allergies    Lidocaine-glycerin  Review of Systems   Review of Systems  Physical Exam Updated Vital Signs BP (!) 146/91 (BP Location: Left Arm)    Pulse (!) 135    Temp 98.2 F (36.8 C) (Oral)    Resp 20    SpO2 95%  Physical Exam Constitutional:      General: He is not in acute distress. Cardiovascular:     Rate and Rhythm: Tachycardia present.     Pulses: Normal pulses.  Pulmonary:     Effort: Pulmonary effort is normal. No respiratory distress.     Breath sounds: Normal breath sounds.  Neurological:     Mental Status: He is alert.     Comments:  CN intact, no facial droop, speech is clear 2/5 strength in right upper extremity on arrival, 1/5 strength in bilateral lower extremities, 5/5 strength in left upper extremity Patient reporting paresthesias in lower extremities and abdomen to approx T12 dermatome    ED Results / Procedures / Treatments   Labs (all labs ordered are listed, but only abnormal results are displayed) Labs Reviewed  CBC - Abnormal; Notable for the following components:      Result Value   Platelets 140 (*)    All other components within normal limits  RESP PANEL BY RT-PCR (FLU A&B, COVID) ARPGX2  PROTIME-INR  APTT  DIFFERENTIAL  ETHANOL  COMPREHENSIVE METABOLIC PANEL  RAPID URINE DRUG SCREEN, HOSP PERFORMED  URINALYSIS, ROUTINE W REFLEX MICROSCOPIC  I-STAT CHEM 8, ED    EKG EKG Interpretation  Date/Time:  Wednesday January 09 2022 19:55:42 EST Ventricular Rate:  136 PR Interval:  162 QRS Duration: 82 QT Interval:  278 QTC Calculation: 418 R Axis:   33 Text Interpretation: Sinus tachycardia Right atrial enlargement Septal infarct , age undetermined T wave abnormality, consider inferior ischemia Abnormal ECG When compared with ECG of 22-Jan-2021 15:43, PREVIOUS ECG IS PRESENT Confirmed by Alvester Chou [phone]) on 01/09/2022 8:13:06 PM  Radiology No results found.  Procedures Procedures    Medications Ordered in ED Medications - No data to display  ED Course/ Medical Decision Making/ A&P Clinical Course as of 01/10/22 0000  Wed Jan 09, 2022  2051 MRI tech contacted and will prioritize patient for imaging [MT]  2150 Informed by staff that patient had refused MRI scan at the machine because he was asking for pain meds.  Patient returned to ED.  I had asked him earlier if he was comfortable at MRI and he did not mention concerns for pain, and it is not clear still where he is having pain. I have made clear to him that this delay in scanning can result of permanent neurological damage if he has  an operative or surgical lesion [MT]  2252 Patient back at MRI for 2nd attempt [MT]    Clinical Course User Index [MT] Joron Velis, Kermit Balo, MD                           Medical Decision Making Amount and/or Complexity of Data Reviewed Radiology: ordered.  Risk Prescription drug management.   Patient presenting with acute onset of right sided weakness, left leg weakness this afternoon.  Differential includes CVA vs spinal lesion vs hypokalemic paralysis vs drug-induced/etoh related complication vs other  GCS 15 on arrival, protecting his airway, no emergent indication for intubation.  He was triaged and activated as code stroke, taken to CT where he was immediately assessed by myself and the neurologist Dr Otelia Limes.  CTH ordered and personally interpreted - no acute infarct or CVA noted, I agree with radiologist interpretation.  External records reviewed - hospital course and neurology consult from Feb 2022, including MRI L spine and LP, which did not show acute process such as GBS or spinal lesion to explain lower extremity paresthesias and weakness at that time.  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  He reports he continues drinking etoh daily.  Etoh level elevated here.   Per his neurological exam, the neurologist recommended stat MRI C and T spine without contrast to evaluate for upper spinal lesion regarding arm weakness in addition to leg weakness.  With upper extremity symptoms this seems less likely a cauda equina syndrome or L spine pathology. The neurologist and I felt this pattern of symptoms were also inconsistent with a brain lesion or stroke.  The patient refused initial scan at MRI, citing pain concerns.  He was given pain medications, ativan for suspected etoh withdrawal (tachycarda), and returned for MRI, which is now pending.  He was aware of the need for timely diagnosis and imaging from my discussion with  him earlier.  Labs reviewed, interpreted by myself -notable for etoh elevation, UDS+cocaine, mild anion gap. K 3.4 - unlikely an issue of hypokalemic paralysis.   ECG on arrival showing sinus rhythm, no acute ischemic findings per my interpretation.    Neurology consulted - final recommendations pending.  On re-evaluation patient appeared to have regained some function of his right arm and improvement of leg weakness.  He was signed out at 11:30 pm to Dr Pilar Plate EDP pending MRI results, f/u on neurology final recommendations, continued monitoring for etoh withdrawal, and likely admission.        Final Clinical Impression(s) / ED Diagnoses Final diagnoses:  None    Rx / DC Orders ED Discharge Orders     None         Nickey Canedo, Kermit Balo, MD 01/10/22 [phone]

## 2022-01-09 NOTE — ED Notes (Signed)
Pt states that if we do not give him a sip of water he is going to leave - MD Trifan notified and pt allowed to drink at this time

## 2022-01-09 NOTE — ED Notes (Signed)
Pt taken to mri

## 2022-01-09 NOTE — ED Triage Notes (Signed)
Pt states he cannot feel anything in his right arm (up through his shoulder) or feel anything in his legs ( up through his hips). Pt states that he had the same issue about 1.5 years ago and was told it was due to "alcoholic neuropathy"

## 2022-01-09 NOTE — ED Notes (Signed)
Pt given more water - MD Trifan notified of pt brought back - Pt educated on importance of getting scans

## 2022-01-09 NOTE — ED Provider Triage Note (Signed)
Emergency Medicine Provider Triage Evaluation Note  Shane Holmes , a 36 y.o. male  was evaluated in triage.  Pt complains of sudden onset right upper extremity and bilateral lower extremity weakness at 630.  Patient has history of the same, discharged from facility on 01/26/2021 due to alcoholic neuropathy leading to sudden onset inability to use right upper extremity, bilateral lower extremities.  Patient states that he was in his usual state of health today when all of a sudden he was unable to use his right arm, bilateral lower extremities.  Patient does endorse EtOH use.  Review of Systems  Positive: Weakness Negative: Chest pain, shortness of breath, nausea, vomiting  Physical Exam  BP (!) 146/91 (BP Location: Left Arm)    Pulse (!) 135    Temp 98.2 F (36.8 C) (Oral)    Resp 20    SpO2 95%  Gen:   Awake, no distress   Resp:  Normal effort  MSK:   Moves extremities without difficulty  Other:    Medical Decision Making  Medically screening exam initiated at 8:08 PM.  Appropriate orders placed.  Shane Holmes was informed that the remainder of the evaluation will be completed by another provider, this initial triage assessment does not replace that evaluation, and the importance of remaining in the ED until their evaluation is complete.     Al Decant, PA-C 01/09/22 2009

## 2022-01-09 NOTE — ED Provider Notes (Signed)
°  Provider Note MRN:  [id number]  Arrival date & time: 01/10/22    ED Course and Medical Decision Making  Assumed care from Dr. Langston Masker at shift change.  Patient presenting with numbness and weakness to 3 extremities, possibly withdrawing as well.  Symptoms improving, awaiting MRI and we will touch base again with neurology.  Discussed MRI results with Dr. Cheral Marker of neurology, plan is to obtain further MRI imaging.  Signed out to oncoming provider at shift change.  Procedures  Final Clinical Impressions(s) / ED Diagnoses     ICD-10-CM   1. Weakness  R53.1       ED Discharge Orders     None       Discharge Instructions   None     Barth Kirks. Sedonia Small, [place] [email or web address]    Maudie Flakes, MD 01/10/22 [phone]

## 2022-01-09 NOTE — ED Notes (Signed)
LSN [id number]

## 2022-01-09 NOTE — ED Notes (Signed)
Per MD Trifan okay to go ahead and give pt pain meds

## 2022-01-09 NOTE — ED Triage Notes (Signed)
Pt arrives via [facility], from home. Per report...Marland KitchenMarland KitchenPt walked out to his truck about an hour ago, sat down and could not get back up. New onset of immobility. Can't move either of his legs or his right arm. Had similar issue about a year ago d/t neuropathy. Cbg 399, no hx of diabetes. Stopped gabapentin about 6 months ago. 140hr, 100% ra, 122/84. GCS 15.

## 2022-01-09 NOTE — ED Notes (Addendum)
Pt back from MRI - placed back on monitor - MD lindzen to place order for pain medicine

## 2022-01-09 NOTE — ED Notes (Signed)
Pt placed on transport monitor and to be taken to MRI again

## 2022-01-09 NOTE — ED Notes (Signed)
MRI has called out pt - per MRI pt will not lay still for scans and is requesting pain medicine - MRI tried to cont with scans but called back again and said that pt is now laying over the side of the bed and requesting water - Pt to be brought back to room - MD and Neuro notified

## 2022-01-10 ENCOUNTER — Emergency Department (HOSPITAL_COMMUNITY): Payer: Self-pay

## 2022-01-10 MED ORDER — SODIUM CHLORIDE 0.9 % IV BOLUS
1000.0000 mL | Freq: Once | INTRAVENOUS | Status: AC
Start: 2022-01-10 — End: 2022-01-10
  Administered 2022-01-10: 1000 mL via INTRAVENOUS

## 2022-01-10 MED ORDER — GADOBUTROL 1 MMOL/ML IV SOLN
8.0000 mL | Freq: Once | INTRAVENOUS | Status: AC | PRN
  Administered 2022-01-10: 8 mL via INTRAVENOUS

## 2022-01-10 MED ORDER — HYDROMORPHONE HCL 1 MG/ML IJ SOLN
INTRAMUSCULAR | Status: AC
  Filled 2022-01-10: qty 1

## 2022-01-10 NOTE — ED Notes (Signed)
MD Bero placed new orders for MRI - MRI called by this RN - 1mg  of dilaudid ordered by MD Bero verbally to be given prior to MRI

## 2022-01-10 NOTE — ED Notes (Signed)
Pt was given water and a snack by the nurse.

## 2022-01-10 NOTE — ED Notes (Addendum)
Pt given meds and taken to MRI on transport monitor at this time

## 2022-01-10 NOTE — Progress Notes (Signed)
Neurology Progress Note  Brief HPI: Shane Holmes is an 36 y.o. male with a PMHx of back pain, bulging lumbar disc, asthma and neuropathy, who presents to the ED from work for assessment of acute onset BLE numbness and weakness, in conjunction with RUE weakness and numbness.   Subjective: Patient reports RUE weakness and numbness has improved. Patient reports lower extremities are back to baseline. Patient still endorses chronic back pain and right leg pain. Discussed MRI findings and discussed having patient follow up outpatient neurosurgery regarding mass should it continue to be symptomatic. Patient states he would consider it but would like to leave now due to work.  Exam: Vitals:   01/10/22 0945 01/10/22 1000  BP: (!) 158/121 (!) 154/123  Pulse: 95 89  Resp: 10 12  Temp:    SpO2: 93% 94%   Gen: In bed, NAD Resp: non-labored breathing, no acute distress Abd: soft, nt  Neuro: Mental Status: Awake, alert and oriented. Speech fluent without evidence of aphasia.  Able to demonstrate comprehension of all commands without difficulty. Cranial Nerves:II-XII intact Motor: 5/5 in all extremities Sensory: Sensation to temperature, light touch, and pain equal and symmetric in upper and lower extremities DTR: 2+ bilatreal brachioradialis and bicepts. 2+ Left patella, 0 Right patella. Gait: Deferred  Pertinent Labs: CBC    Component Value Date/Time   WBC 6.5 01/09/2022 2013   RBC 4.44 01/09/2022 2013   HGB 15.0 01/09/2022 2056   HCT 44.0 01/09/2022 2056   PLT 140 (L) 01/09/2022 2013   MCV 90.3 01/09/2022 2013   MCH 30.2 01/09/2022 2013   MCHC 33.4 01/09/2022 2013   RDW 13.5 01/09/2022 2013   LYMPHSABS 1.7 01/09/2022 2013   MONOABS 0.5 01/09/2022 2013   EOSABS 0.1 01/09/2022 2013   BASOSABS 0.0 01/09/2022 2013   BMP Latest Ref Rng & Units 01/09/2022 01/09/2022 01/26/2021  Glucose 70 - 99 mg/dL 876(O) 115(B) 262(M)  BUN 6 - 20 mg/dL 9 9 19   Creatinine 0.61 - 1.24 mg/dL 3.55 9.74   Sodium 135 - 145 mmol/L 138 133(L) 133(L)  Potassium 3.5 - 5.1 mmol/L 3.4(L) 3.4(L) 3.6  Chloride 98 - 111 mmol/L 99 98 99  CO2 22 - 32 mmol/L - 19(L) 24  Calcium 8.9 - 10.3 mg/dL - 8.9 1.63)     Imaging Reviewed: MRI brain: no acute intracranial abnormalities MRI Spine: 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 Normal cord and cauda equina signal. Chronic L5 pars defects  Assessment: 36 y.o. male with a PMHx of back pain, bulging lumbar disc, asthma and neuropathy, who presents to the ED from work for assessment of acute onset BLE numbness and weakness, in conjunction with RUE weakness and numbness. Symptoms appear to have spontaneously resolved. Patient has eloped from ED.   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

## 2022-01-10 NOTE — ED Notes (Signed)
Fluids finished - MD notified of vitals - no further orders at this time

## 2022-01-10 NOTE — ED Notes (Signed)
Pt remains in MRI at this time - will obtain vitals when pt is back

## 2022-01-10 NOTE — ED Notes (Signed)
Pt seen fully clothed and walking out with no difficulty

## 2022-01-10 NOTE — ED Notes (Signed)
Pt tachy in the 130s-140s - MD bero notified and verbal order for bolus obtained

## 2022-01-10 NOTE — ED Notes (Signed)
Notified Belfi MD about pt wanting to leave. While Belfi MD and this RN were on the way to pt room, pt was noted to be eloping. Pt w/ steady gait upon departure.

## 2022-01-10 NOTE — ED Provider Notes (Signed)
Care was taken over from Dr. Sedonia Small.  Patient is a 36 year old who presented with lower extremity and right upper extremity weakness.  Patient was waiting for MRIs.  He had extensive MRI imaging done which shows no acute abnormalities.  I consulted back with neurology who is coming to evaluate the patient and patient was noted to be walking out of the emergency department.  He had previously told a nurse that he was ready to leave and did not want stay any longer.  When I went to try to go see him, he had already left.   Malvin Johns, MD 01/10/22 1048

## 2022-01-12 ENCOUNTER — Emergency Department
Admission: EM | Admit: 2022-01-12 | Discharge: 2022-01-12 | Disposition: A | Discharge status: Home / Self Care | Payer: Self-pay | Attending: Emergency Medicine | Admitting: Emergency Medicine

## 2022-01-12 ENCOUNTER — Other Ambulatory Visit: Payer: Self-pay

## 2022-01-12 DIAGNOSIS — Z5321 Procedure and treatment not carried out due to patient leaving prior to being seen by health care provider: Secondary | ICD-10-CM | POA: Insufficient documentation

## 2022-01-12 DIAGNOSIS — K0889 Other specified disorders of teeth and supporting structures: Secondary | ICD-10-CM | POA: Insufficient documentation

## 2022-01-12 DIAGNOSIS — K047 Periapical abscess without sinus: Secondary | ICD-10-CM | POA: Insufficient documentation

## 2022-01-12 MED ORDER — AMOXICILLIN 500 MG PO CAPS: 500.0000 mg | ORAL_CAPSULE | Freq: Once | ORAL | Status: DC

## 2022-01-12 MED ORDER — AMOXICILLIN 875 MG PO TABS: 875.0000 mg | ORAL_TABLET | Freq: Two times a day (BID) | ORAL | 0 refills | Status: AC

## 2022-01-12 NOTE — ED Triage Notes (Signed)
Pt with right sided facial swelling. Pt states has been present since yesterday. Pt with swelling noted to upper right jaw. Pt states he believes he has a dental infection.

## 2022-01-12 NOTE — Discharge Instructions (Signed)
OPTIONS FOR DENTAL FOLLOW UP CARE ° °[place] [facility] - Local Safety Net Dental Clinics °http://www.ncdhhs.gov/dph/oralhealth/services/safetynetclinics.htm °  °[facility] (336-562-3123) ° °[facility] [place] (919-933-9087) ° °[facility] ([phone] ext [phone]) ° °[facility] ([phone]) ° °[facility] ([phone]) °This clinic caters to the indigent population and is on a lottery system. °Location: °[facility], [address], [place] °Clinic Hours: °Wednesdays from 6pm - 9pm, patients seen by a lottery system. °For dates, call or go to [email or web address].[facility].edu/[facility]/patients/Dental-[facility] °Services: °Cleanings, fillings and simple extractions. °Payment Options: °DENTAL WORK IS FREE OF CHARGE. Bring proof of income or support. °Best way to get seen: °Arrive at 5:15 pm - this is a lottery, NOT first come/first serve, so arriving earlier will not increase your chances of being seen. °  °  °[facility] °[phone] °Select option 1 for emergencies °  °Location: °[facility], [address], [place] °Clinic Hours: °No walk-ins accepted - call the day before to schedule an appointment. °Check in times are 9:30 am and 1:30 pm. °Services: °Simple extractions, temporary fillings, pulpectomy/pulp debridement, uncomplicated abscess drainage. °Payment Options: °PAYMENT IS DUE AT THE TIME OF SERVICE.  Fee is usually $100-200, additional surgical procedures (e.g. abscess drainage) may be extra. °Cash, checks, Visa/MasterCard accepted.  Can file Medicaid if patient is covered for dental - patient should call case worker to check. °No discount for [facility] patients. °Best way to get seen: °MUST call the day before and get onto the schedule. Can usually be seen the next 1-2 days. No walk-ins accepted. °  °  °[facility] °[phone] °   °Location: °[facility], [address], [place] °Clinic Hours: °M, W, Th, F 8am or 1:30pm, Tues 9a or 1:30 - first come/first served. °Services: °Simple extractions, temporary fillings, uncomplicated abscess drainage.  You do not need to be an [place] resident. °Payment Options: °PAYMENT IS DUE AT THE TIME OF SERVICE. °Dental insurance, otherwise sliding scale - bring proof of income or support. °Depending on income and treatment needed, cost is usually $50-200. °Best way to get seen: °Arrive early as it is first come/first served. °  °  °[facility] °[phone] °  °Location: °[address] °Clinic Hours: °Mon-Thu 8a-5p °Services: °Most basic dental services including extractions and fillings. °Payment Options: °PAYMENT IS DUE AT THE TIME OF SERVICE. °Sliding scale, up to 50% off - bring proof if income or support. °Medicaid with dental option accepted. °Best way to get seen: °Call to schedule an appointment, can usually be seen within 2 weeks OR they will try to see walk-ins - show up at 8a or 2p (you may have to wait). °  °  °[facility] °[phone] °[place] RESIDENTS ONLY °  °Location: °[facility], [address], [place], [place] [address] °Clinic Hours: By appointment only. °Monday - Thursday 8am-5pm, Friday 8am-12pm °Services: Cleanings, fillings, extractions. °Payment Options: °PAYMENT IS DUE AT THE TIME OF SERVICE. °Cash, Visa or MasterCard. Sliding scale - $30 minimum per service. °Best way to get seen: °Come in to office, complete packet and make an appointment - need proof of income °or support monies for each household member and proof of [place] residence. °Usually takes about a month to get in. °  °  °[facility] °[phone] °  °Location: °[address]   [place] °Clinic Hours: Walk-in Urgent Care Dental Services are offered Monday-Friday  mornings only. °The numbers of emergencies accepted daily is limited to the number of °providers available. °Maximum 15 - Mondays, Wednesdays & Thursdays °Maximum 10 - Tuesdays & Fridays °Services: °You do not need to be a [place] resident to be seen for a dental emergency. °Emergencies are defined as pain, swelling, abnormal bleeding, or dental trauma. Walkins will receive x-rays if needed. °NOTE: Dental cleaning is not an emergency. °Payment Options: °PAYMENT IS DUE AT THE TIME OF SERVICE. °Minimum co-pay is $40.00 for uninsured patients. °Minimum co-pay is $3.00 for Medicaid with dental coverage. °Dental Insurance is accepted and must be presented at time of visit. °Medicare does not cover dental. °Forms of payment: Cash, credit card, checks. °Best way to get seen: °If not previously registered with the clinic, walk-in dental registration begins at 7:15 am and is on a first come/first serve basis. °If previously registered with the clinic, call to make an appointment. °  °  °[facility] °[phone] °[place] RESIDENTS ONLY °  °Location: °[address], [place],  °Clinic Hours: °Mon-Thu 10a-2p °Services: Extractions only! °Payment Options: °FREE (donations accepted) - bring proof of income or support °Best way to get seen: °Call and schedule an appointment OR come at 8am on the 1st Monday of every month (except for holidays) when it is first come/first served. °  °  °[facility] °[phone] °  °Location: °[address], [place] °Clinic Hours: °Friday mornings °Services, Payment Options, Best way to get seen: °Call for info °

## 2022-01-12 NOTE — ED Notes (Signed)
Recheck of patient's heart rate per provider. Patient stated that he just wanted his "damn antibiotics and go home." I explained that the provider was just concerned that his heart rate was high and he stated he was aggravated. I notified provider.

## 2022-01-12 NOTE — ED Triage Notes (Signed)
Pt has swelling of the right mouth, and states he has a dental infection. Pt states he has a gum disease that causes his teeth to root from the inside. He has saved up enough for the Sx but needs antibiotics to clear out the infection.

## 2022-01-12 NOTE — ED Provider Notes (Signed)
[facility] Provider Note  Patient Contact: 10:06 PM (approximate)   History   Dental Problem   HPI  Shane Holmes is a 36 y.o. male presents to the emergency department with right upper jaw swelling for the past 2 days.  Patient denies fever and chills or pain underneath the tongue.  Patient does not currently have an appointment with a local dentist.      Physical Exam   Triage Vital Signs: ED Triage Vitals  Enc Vitals Group     BP 01/12/22 2006 (!) 130/103     Pulse Rate 01/12/22 2006 (!) 121     Resp 01/12/22 2006 17     Temp 01/12/22 2006 98.3 F (36.8 C)     Temp Source 01/12/22 2006 Oral     SpO2 01/12/22 2006 98 %     Weight 01/12/22 2007 191 lb (86.6 kg)     Height 01/12/22 2007 5\' 11"  (1.803 m)     Head Circumference --      Peak Flow --      Pain Score 01/12/22 2024 0     Pain Loc --      Pain Edu? --      Excl. in [place]? --     Most recent vital signs: Vitals:   01/12/22 2007 01/12/22 2155  BP:    Pulse: (!) 122 (!) 122  Resp: 18 18  Temp:    SpO2: 100% 97%     General: Alert and in no acute distress. Eyes:  PERRL. EOMI. Head: No acute traumatic findings ENT:      Ears:       Nose: No congestion/rhinnorhea.      Mouth/Throat: Mucous membranes are moist.  Patient has swelling of right upper jaw.  No pain to palpation underneath the tongue. Neck: No stridor. No cervical spine tenderness to palpation. Cardiovascular:  Good peripheral perfusion Respiratory: Normal respiratory effort without tachypnea or retractions. Lungs CTAB. Good air entry to the bases with no decreased or absent breath sounds. Musculoskeletal: Full range of motion to all extremities.  Neurologic:  No gross focal neurologic deficits are appreciated.  Skin:   No rash noted Other:   ED Results / Procedures / Treatments   Labs (all labs ordered are listed, but only abnormal results are displayed) Labs Reviewed - No data to  display      PROCEDURES:  Critical Care performed: No  Procedures   MEDICATIONS ORDERED IN ED: Medications  amoxicillin (AMOXIL) capsule 500 mg (has no administration in time range)     IMPRESSION / MDM / ASSESSMENT AND PLAN / ED COURSE  I reviewed the triage vital signs and the nursing notes.                              Assessment and plan:  Dental pain:   Differential diagnosis includes, but is not limited to, dental abscess, dental pain  36 year old male presents to the emergency department with dental abscess and right upper jaw swelling.  Patient was tachycardic at triage and hypertensive but vital signs were otherwise reassuring.  When discussing patient's heart rate, patient stated that he was irritated and was not going to stay in the emergency department.  I asked patient if he had any fever and chills and he repeated that he was aggravated and that he would "just go to [place] long".  Patient was given prescription for amoxicillin  and advised to establish care with a local dentist.     FINAL CLINICAL IMPRESSION(S) / ED DIAGNOSES   Final diagnoses:  Pain, dental     Rx / DC Orders   ED Discharge Orders          Ordered    amoxicillin (AMOXIL) 875 MG tablet  2 times daily        01/12/22 2201             Note:  This document was prepared using Dragon voice recognition software and may include unintentional dictation errors.   Vallarie Mare [place], Hershal Coria 01/12/22 2218    Blake Divine, MD 01/13/22 [phone]

## 2022-01-12 NOTE — ED Notes (Signed)
Patient states he needs antibiotics for his dental absess  in the upper right jaw. Patient states that he has ongoing problems with his teeth. He plans to find a new dentist this week with his wife's help. Patient denies any other needs or complaints at this time.

## 2022-05-22 ENCOUNTER — Other Ambulatory Visit: Payer: Self-pay

## 2022-05-22 ENCOUNTER — Emergency Department (HOSPITAL_BASED_OUTPATIENT_CLINIC_OR_DEPARTMENT_OTHER)
Admission: EM | Admit: 2022-05-22 | Discharge: 2022-05-23 | Disposition: A | Discharge status: Home / Self Care | Payer: Self-pay | Attending: Emergency Medicine | Admitting: Emergency Medicine

## 2022-05-22 ENCOUNTER — Encounter (HOSPITAL_BASED_OUTPATIENT_CLINIC_OR_DEPARTMENT_OTHER): Payer: Self-pay

## 2022-05-22 DIAGNOSIS — R739 Hyperglycemia, unspecified: Secondary | ICD-10-CM | POA: Insufficient documentation

## 2022-05-22 DIAGNOSIS — G629 Polyneuropathy, unspecified: Secondary | ICD-10-CM | POA: Insufficient documentation

## 2022-05-22 DIAGNOSIS — E876 Hypokalemia: Secondary | ICD-10-CM | POA: Insufficient documentation

## 2022-05-22 DIAGNOSIS — M79604 Pain in right leg: Secondary | ICD-10-CM

## 2022-05-22 DIAGNOSIS — D649 Anemia, unspecified: Secondary | ICD-10-CM | POA: Insufficient documentation

## 2022-05-22 MED ORDER — KETOROLAC TROMETHAMINE 30 MG/ML IJ SOLN
30.0000 mg | Freq: Once | INTRAMUSCULAR | Status: AC
  Administered 2022-05-23: 30 mg via INTRAVENOUS

## 2022-05-22 MED ORDER — METHOCARBAMOL 500 MG PO TABS
500.0000 mg | ORAL_TABLET | Freq: Once | ORAL | Status: AC
Start: 2022-05-23 — End: 2022-05-23
  Administered 2022-05-23: 500 mg via ORAL

## 2022-05-22 MED ORDER — METHOCARBAMOL 1000 MG/10ML IJ SOLN: 1000.0000 mg | Freq: Once | INTRAMUSCULAR | Status: DC

## 2022-05-22 NOTE — ED Triage Notes (Signed)
Patient here POV from Home.  Endorses Bilateral Leg Pain that began 2.5-3 Weeks PTA. States Historically this has occurred due to Neuropathy.  Constant in [place] and Worsens with Certain Movements. Has attempted Exercises which Helps at Times but More recently it has helped less.   No Acute Trauma or Injury. Pulses Palpable Distally.  NAD Noted during Triage. A&Ox4. GCS 15. Ambulatory.

## 2022-05-22 NOTE — ED Provider Notes (Signed)
[facility] EMERGENCY DEPT  Provider Note  CSN: [id number] Arrival date & time: 05/22/22 2046  History Chief Complaint  Patient presents with   Leg Pain    Shane Holmes is a 36 y.o. male with history of alcohol use and peripheral neuropathy reports he has had 2-3 weeks of lower extremity cramping/spasms and lightning pain, similar to previous neuropathy pain but getting worse. Pain is more severe on the right. He is not taking anything for his neuropathy because prior trial of gabapentin caused too much sedation. He continues to drink alcohol regularly. No falls or injuries. No fevers or infectious symptoms. No recent travel. No leg swelling.    Home Medications Prior to Admission medications   Medication Sig Start Date End Date Taking? Authorizing Provider  metFORMIN (GLUCOPHAGE) 500 MG tablet Take 1 tablet (500 mg total) by mouth 2 (two) times daily with a meal. 05/23/22 06/22/22 Yes Pollyann Savoy, MD  albuterol (VENTOLIN HFA) 108 (90 Base) MCG/ACT inhaler INHALE 2 PUFFS INTO THE LUNGS EVERY SIX HOURS AS NEEDED FOR WHEEZING OR SHORTNESS OF BREATH. 01/26/21 01/26/22  Ghimire, Werner Lean, MD  folic acid (FOLVITE) 1 MG tablet Take 1 tablet (1 mg total) by mouth daily. Patient not taking: Reported on 01/10/2022 01/26/21   Maretta Bees, MD  gabapentin (NEURONTIN) 400 MG capsule Take 1 capsule (400 mg total) by mouth 3 (three) times daily. 01/26/21   Ghimire, Werner Lean, MD  ibuprofen (ADVIL) 200 MG tablet Take 400-600 mg by mouth every 6 (six) hours as needed for fever, headache or mild pain.    [provider]  Multiple Vitamin (MULTIVITAMIN WITH MINERALS) TABS tablet Take 1 tablet by mouth daily. 01/27/21   Ghimire, Werner Lean, MD  thiamine 100 MG tablet Take 1 tablet (100 mg total) by mouth daily. Patient not taking: Reported on 01/10/2022 01/27/21   Maretta Bees, MD  vitamin B-12 (CYANOCOBALAMIN) 1000 MCG tablet Take 1 tablet (1,000 mcg total) by mouth  daily. Patient not taking: Reported on 01/10/2022 01/26/21   Maretta Bees, MD  dicyclomine (BENTYL) 20 MG tablet Take 1 tablet (20 mg total) by mouth 2 (two) times daily. Patient not taking: Reported on 11/02/2019 03/02/14 11/03/19  [name], PA-C     Allergies    Lidocaine-glycerin   Review of Systems   Review of Systems Please see HPI for pertinent positives and negatives  Physical Exam BP (!) 126/92   Pulse (!) 102   Temp 97.9 F (36.6 C) (Temporal)   Resp 18   Ht 5\' 11"  (1.803 m)   Wt 86.6 kg   SpO2 97%   BMI 26.63 kg/m   Physical Exam Vitals and nursing note reviewed.  Constitutional:      Appearance: Normal appearance.  HENT:     Head: Normocephalic and atraumatic.     Nose: Nose normal.     Mouth/Throat:     Mouth: Mucous membranes are moist.  Eyes:     Extraocular Movements: Extraocular movements intact.     Conjunctiva/sclera: Conjunctivae normal.  Cardiovascular:     Rate and Rhythm: Normal rate.     Pulses: Normal pulses.  Pulmonary:     Effort: Pulmonary effort is normal.     Breath sounds: Normal breath sounds.  Abdominal:     General: Abdomen is flat.     Palpations: Abdomen is soft.     Tenderness: There is no abdominal tenderness.  Musculoskeletal:  General: No swelling or tenderness (soft tissue). Normal range of motion.     Cervical back: Neck supple.     Right lower leg: No edema.     Left lower leg: No edema.     Comments: Occasional spasm of muscles in R upper leg  Skin:    General: Skin is warm and dry.  Neurological:     General: No focal deficit present.     Mental Status: He is alert.  Psychiatric:        Mood and Affect: Mood normal.     ED Results / Procedures / Treatments   EKG None  Procedures Procedures  Medications Ordered in the ED Medications  methocarbamol (ROBAXIN) 500 MG tablet (  Not Given 05/23/22 0026)  ketorolac (TORADOL) 30 MG/ML injection (  Not Given 05/23/22 0026)  nicotine (NICODERM CQ -  dosed in mg/24 hours) patch 21 mg (21 mg Transdermal Patch Applied 05/23/22 0136)  HYDROcodone-acetaminophen (NORCO/VICODIN) 5-325 MG per tablet 1 tablet (has no administration in time range)  ketorolac (TORADOL) 30 MG/ML injection 30 mg (30 mg Intravenous Given 05/23/22 0007)  methocarbamol (ROBAXIN) tablet 500 mg (500 mg Oral Given 05/23/22 0002)  lactated ringers bolus 1,000 mL (0 mLs Intravenous Stopped 05/23/22 0257)  fentaNYL (SUBLIMAZE) injection 50 mcg (50 mcg Intravenous Given 05/23/22 0128)    Initial Impression and Plan  Patient with muscle spasm and/or neuropathic pain. Will check labs to evaluate blood counts and electrolyte abnormalities. Toradol/Robaxin for pain.   ED Course   Clinical Course as of 05/23/22 0304  Thu May 23, 2022  0031 CBC with anemia, worse from baseline but not in need of transfusion.  [CS]  0110 CMP shows mild hypokalemia, mild hyperglycemia, no documented history of DM. Will give IVF and recheck. LFTs consistent with EtOH use.  [CS]  0210 CK is normal.  [CS]  0300 CBG is improving. Patient reports he would like to go home. Offered Rx for pain meds but he states he wouldn't be able to get it filled. He will try to get an Rx for Metformin filled for his hyperglycemia. Recommend outpatient follow up with a PCP for further management of his hyperglycemia. I suspect his leg pain is ultimately due to underlying neuropathy.  [CS]    Clinical Course User Index [CS] Pollyann Savoy, MD     MDM Rules/Calculators/A&P Medical Decision Making Problems Addressed: Hyperglycemia: acute illness or injury Pain of right lower extremity: chronic illness or injury with exacerbation, progression, or side effects of treatment Peripheral polyneuropathy: chronic illness or injury  Amount and/or Complexity of Data Reviewed Labs: ordered. Decision-making details documented in ED Course.  Risk OTC drugs. Prescription drug management. Parenteral controlled  substances.    Final Clinical Impression(s) / ED Diagnoses Final diagnoses:  Pain of right lower extremity  Peripheral polyneuropathy  Hyperglycemia    Rx / DC Orders ED Discharge Orders          Ordered    metFORMIN (GLUCOPHAGE) 500 MG tablet  2 times daily with meals        05/23/22 0303             Pollyann Savoy, MD 05/23/22 [phone]

## 2022-05-23 LAB — CBC WITH DIFFERENTIAL/PLATELET
Abs Immature Granulocytes: 0.04 10*3/uL (ref 0.00–0.07)
Basophils Absolute: 0.1 10*3/uL (ref 0.0–0.1)
Basophils Relative: 1 %
Eosinophils Absolute: 0 10*3/uL (ref 0.0–0.5)
Eosinophils Relative: 1 %
HCT: 33.3 % — ABNORMAL LOW (ref 39.0–52.0)
Hemoglobin: 11.7 g/dL — ABNORMAL LOW (ref 13.0–17.0)
Immature Granulocytes: 1 %
Lymphocytes Relative: 23 %
Lymphs Abs: 1.7 10*3/uL (ref 0.7–4.0)
MCH: 30.5 pg (ref 26.0–34.0)
MCHC: 35.1 g/dL (ref 30.0–36.0)
MCV: 86.9 fL (ref 80.0–100.0)
Monocytes Absolute: 0.9 10*3/uL (ref 0.1–1.0)
Monocytes Relative: 11 %
Neutro Abs: 4.8 10*3/uL (ref 1.7–7.7)
Neutrophils Relative %: 63 %
Platelets: 99 10*3/uL — ABNORMAL LOW (ref 150–400)
RBC: 3.83 MIL/uL — ABNORMAL LOW (ref 4.22–5.81)
RDW: 17.3 % — ABNORMAL HIGH (ref 11.5–15.5)
WBC: 7.4 10*3/uL (ref 4.0–10.5)
nRBC: 0 % (ref 0.0–0.2)

## 2022-05-23 LAB — COMPREHENSIVE METABOLIC PANEL
ALT: 39 U/L (ref 0–44)
AST: 105 U/L — ABNORMAL HIGH (ref 15–41)
Albumin: 4 g/dL (ref 3.5–5.0)
Alkaline Phosphatase: 155 U/L — ABNORMAL HIGH (ref 38–126)
Anion gap: 15 (ref 5–15)
BUN: 6 mg/dL (ref 6–20)
CO2: 25 mmol/L (ref 22–32)
Calcium: 9.4 mg/dL (ref 8.9–10.3)
Chloride: 94 mmol/L — ABNORMAL LOW (ref 98–111)
Creatinine, Ser: 0.7 mg/dL (ref 0.61–1.24)
GFR, Estimated: >60 mL/min (ref >60)
Glucose, Bld: 318 mg/dL — ABNORMAL HIGH (ref 70–99)
Potassium: 3.3 mmol/L — ABNORMAL LOW (ref 3.5–5.1)
Sodium: 134 mmol/L — ABNORMAL LOW (ref 135–145)
Total Bilirubin: 3.3 mg/dL — ABNORMAL HIGH (ref 0.3–1.2)
Total Protein: 7.5 g/dL (ref 6.5–8.1)

## 2022-05-23 LAB — CK: Total CK: 89 U/L (ref 49–397)

## 2022-05-23 LAB — CBG MONITORING, ED: Glucose-Capillary: 238 mg/dL — ABNORMAL HIGH (ref 70–99)

## 2022-05-23 MED ORDER — LACTATED RINGERS IV BOLUS
1000.0000 mL | Freq: Once | INTRAVENOUS | Status: AC
  Administered 2022-05-23: 1000 mL via INTRAVENOUS

## 2022-05-23 MED ORDER — METFORMIN HCL 500 MG PO TABS: 500.0000 mg | ORAL_TABLET | Freq: Two times a day (BID) | ORAL | 0 refills | Status: DC

## 2022-05-23 MED ORDER — KETOROLAC TROMETHAMINE 30 MG/ML IJ SOLN
INTRAMUSCULAR | Status: AC
  Filled 2022-05-23: qty 1

## 2022-05-23 MED ORDER — FENTANYL CITRATE PF 50 MCG/ML IJ SOSY
50.0000 ug | PREFILLED_SYRINGE | Freq: Once | INTRAMUSCULAR | Status: AC
  Administered 2022-05-23: 50 ug via INTRAVENOUS
  Filled 2022-05-23: qty 1

## 2022-05-23 MED ORDER — METHOCARBAMOL 500 MG PO TABS
ORAL_TABLET | ORAL | Status: AC
  Filled 2022-05-23: qty 1

## 2022-05-23 MED ORDER — HYDROCODONE-ACETAMINOPHEN 5-325 MG PO TABS
1.0000 | ORAL_TABLET | Freq: Once | ORAL | Status: AC
  Administered 2022-05-23: 1 via ORAL
  Filled 2022-05-23: qty 1

## 2022-05-23 MED ORDER — NICOTINE 21 MG/24HR TD PT24
21.0000 mg | MEDICATED_PATCH | Freq: Once | TRANSDERMAL | Status: DC
  Administered 2022-05-23: 21 mg via TRANSDERMAL
  Filled 2022-05-23: qty 1

## 2022-05-23 NOTE — ED Notes (Signed)
Pt verbalizes understanding of discharge instructions. Opportunity for questioning and answers were provided. Pt discharged from ED to home with significant other.   ? ?

## 2022-06-05 ENCOUNTER — Encounter (HOSPITAL_BASED_OUTPATIENT_CLINIC_OR_DEPARTMENT_OTHER): Payer: Self-pay | Admitting: Urology

## 2022-06-05 DIAGNOSIS — Z5321 Procedure and treatment not carried out due to patient leaving prior to being seen by health care provider: Secondary | ICD-10-CM | POA: Insufficient documentation

## 2022-06-05 DIAGNOSIS — G5783 Other specified mononeuropathies of bilateral lower limbs: Secondary | ICD-10-CM | POA: Insufficient documentation

## 2022-06-05 LAB — CBC WITH DIFFERENTIAL/PLATELET
Abs Immature Granulocytes: 0.04 10*3/uL (ref 0.00–0.07)
Basophils Absolute: 0 10*3/uL (ref 0.0–0.1)
Basophils Relative: 1 %
Eosinophils Absolute: 0.1 10*3/uL (ref 0.0–0.5)
Eosinophils Relative: 1 %
HCT: 32.2 % — ABNORMAL LOW (ref 39.0–52.0)
Hemoglobin: 11.1 g/dL — ABNORMAL LOW (ref 13.0–17.0)
Immature Granulocytes: 1 %
Lymphocytes Relative: 17 %
Lymphs Abs: 1.5 10*3/uL (ref 0.7–4.0)
MCH: 31.4 pg (ref 26.0–34.0)
MCHC: 34.5 g/dL (ref 30.0–36.0)
MCV: 91 fL (ref 80.0–100.0)
Monocytes Absolute: 0.8 10*3/uL (ref 0.1–1.0)
Monocytes Relative: 9 %
Neutro Abs: 6 10*3/uL (ref 1.7–7.7)
Neutrophils Relative %: 71 %
Platelets: 144 10*3/uL — ABNORMAL LOW (ref 150–400)
RBC: 3.54 MIL/uL — ABNORMAL LOW (ref 4.22–5.81)
RDW: 16.7 % — ABNORMAL HIGH (ref 11.5–15.5)
WBC: 8.4 10*3/uL (ref 4.0–10.5)
nRBC: 0 % (ref 0.0–0.2)

## 2022-06-05 LAB — COMPREHENSIVE METABOLIC PANEL
ALT: 27 U/L (ref 0–44)
AST: 112 U/L — ABNORMAL HIGH (ref 15–41)
Albumin: 3.8 g/dL (ref 3.5–5.0)
Alkaline Phosphatase: 173 U/L — ABNORMAL HIGH (ref 38–126)
Anion gap: 14 (ref 5–15)
BUN: <5 mg/dL — ABNORMAL LOW (ref 6–20)
CO2: 30 mmol/L (ref 22–32)
Calcium: 9.4 mg/dL (ref 8.9–10.3)
Chloride: 92 mmol/L — ABNORMAL LOW (ref 98–111)
Creatinine, Ser: 0.61 mg/dL (ref 0.61–1.24)
GFR, Estimated: >60 mL/min (ref >60)
Glucose, Bld: 248 mg/dL — ABNORMAL HIGH (ref 70–99)
Potassium: 3.1 mmol/L — ABNORMAL LOW (ref 3.5–5.1)
Sodium: 136 mmol/L (ref 135–145)
Total Bilirubin: 3 mg/dL — ABNORMAL HIGH (ref 0.3–1.2)
Total Protein: 7.6 g/dL (ref 6.5–8.1)

## 2022-06-05 LAB — CK: Total CK: 62 U/L (ref 49–397)

## 2022-06-05 MED ORDER — OXYCODONE-ACETAMINOPHEN 5-325 MG PO TABS
1.0000 | ORAL_TABLET | Freq: Once | ORAL | Status: AC
  Administered 2022-06-05: 1 via ORAL
  Filled 2022-06-05: qty 1

## 2022-06-05 NOTE — ED Triage Notes (Signed)
Pt states seen x 1 month ago for same pain Bilateral leg pain with neuropathy worsening over past two days, worse when laying down  States pain radiating to lower back  No swelling noted  Took ibuprofen and tylenol with no relief

## 2022-06-05 NOTE — ED Notes (Signed)
Pt states unable to urinate at this time, given urine spec cup in lobby while waiting

## 2022-06-06 ENCOUNTER — Emergency Department (HOSPITAL_BASED_OUTPATIENT_CLINIC_OR_DEPARTMENT_OTHER)
Admission: EM | Admit: 2022-06-06 | Discharge: 2022-06-06 | Discharge status: Against Medical Advice | Payer: Self-pay | Attending: Emergency Medical Services | Admitting: Emergency Medical Services

## 2022-06-07 ENCOUNTER — Encounter (HOSPITAL_BASED_OUTPATIENT_CLINIC_OR_DEPARTMENT_OTHER): Payer: Self-pay

## 2022-06-07 ENCOUNTER — Other Ambulatory Visit: Payer: Self-pay

## 2022-06-07 ENCOUNTER — Emergency Department (HOSPITAL_BASED_OUTPATIENT_CLINIC_OR_DEPARTMENT_OTHER)
Admission: EM | Admit: 2022-06-07 | Discharge: 2022-06-07 | Disposition: A | Discharge status: Home / Self Care | Payer: Self-pay | Attending: Emergency Medicine | Admitting: Emergency Medicine

## 2022-06-07 DIAGNOSIS — E876 Hypokalemia: Secondary | ICD-10-CM | POA: Insufficient documentation

## 2022-06-07 DIAGNOSIS — M62838 Other muscle spasm: Secondary | ICD-10-CM | POA: Insufficient documentation

## 2022-06-07 HISTORY — DX: Encounter for other specified special examinations: Z01.89

## 2022-06-07 HISTORY — DX: COVID-19: U07.1

## 2022-06-07 HISTORY — DX: Gastrointestinal hemorrhage, unspecified: K92.2

## 2022-06-07 HISTORY — DX: Melena: K92.1

## 2022-06-07 HISTORY — DX: Hypo-osmolality and hyponatremia: E87.1

## 2022-06-07 HISTORY — DX: Hypokalemia: E87.6

## 2022-06-07 HISTORY — DX: Alcohol abuse, uncomplicated: F10.10

## 2022-06-07 LAB — ETHANOL: Alcohol, Ethyl (B): 202 mg/dL — ABNORMAL HIGH (ref <10)

## 2022-06-07 LAB — COMPREHENSIVE METABOLIC PANEL
ALT: 25 U/L (ref 0–44)
AST: 107 U/L — ABNORMAL HIGH (ref 15–41)
Albumin: 3.6 g/dL (ref 3.5–5.0)
Alkaline Phosphatase: 171 U/L — ABNORMAL HIGH (ref 38–126)
Anion gap: 13 (ref 5–15)
BUN: 5 mg/dL — ABNORMAL LOW (ref 6–20)
CO2: 28 mmol/L (ref 22–32)
Calcium: 9 mg/dL (ref 8.9–10.3)
Chloride: 95 mmol/L — ABNORMAL LOW (ref 98–111)
Creatinine, Ser: 0.54 mg/dL — ABNORMAL LOW (ref 0.61–1.24)
GFR, Estimated: >60 mL/min (ref >60)
Glucose, Bld: 300 mg/dL — ABNORMAL HIGH (ref 70–99)
Potassium: 3.1 mmol/L — ABNORMAL LOW (ref 3.5–5.1)
Sodium: 136 mmol/L (ref 135–145)
Total Bilirubin: 2.5 mg/dL — ABNORMAL HIGH (ref 0.3–1.2)
Total Protein: 7.2 g/dL (ref 6.5–8.1)

## 2022-06-07 LAB — CBC WITH DIFFERENTIAL/PLATELET
Abs Immature Granulocytes: 0.02 10*3/uL (ref 0.00–0.07)
Basophils Absolute: 0.1 10*3/uL (ref 0.0–0.1)
Basophils Relative: 1 %
Eosinophils Absolute: 0.1 10*3/uL (ref 0.0–0.5)
Eosinophils Relative: 1 %
HCT: 32.1 % — ABNORMAL LOW (ref 39.0–52.0)
Hemoglobin: 10.7 g/dL — ABNORMAL LOW (ref 13.0–17.0)
Immature Granulocytes: 0 %
Lymphocytes Relative: 22 %
Lymphs Abs: 1.6 10*3/uL (ref 0.7–4.0)
MCH: 30.6 pg (ref 26.0–34.0)
MCHC: 33.3 g/dL (ref 30.0–36.0)
MCV: 91.7 fL (ref 80.0–100.0)
Monocytes Absolute: 0.9 10*3/uL (ref 0.1–1.0)
Monocytes Relative: 12 %
Neutro Abs: 4.9 10*3/uL (ref 1.7–7.7)
Neutrophils Relative %: 64 %
Platelets: 138 10*3/uL — ABNORMAL LOW (ref 150–400)
RBC: 3.5 MIL/uL — ABNORMAL LOW (ref 4.22–5.81)
RDW: 16.6 % — ABNORMAL HIGH (ref 11.5–15.5)
WBC: 7.6 10*3/uL (ref 4.0–10.5)
nRBC: 0 % (ref 0.0–0.2)

## 2022-06-07 LAB — RAPID URINE DRUG SCREEN, HOSP PERFORMED
Amphetamines: NOT DETECTED
Barbiturates: NOT DETECTED
Benzodiazepines: NOT DETECTED
Cocaine: NOT DETECTED
Opiates: NOT DETECTED
Tetrahydrocannabinol: NOT DETECTED

## 2022-06-07 LAB — CK: Total CK: 59 U/L (ref 49–397)

## 2022-06-07 MED ORDER — METHOCARBAMOL 500 MG PO TABS: 500.0000 mg | ORAL_TABLET | Freq: Three times a day (TID) | ORAL | 0 refills | Status: DC | PRN

## 2022-06-07 MED ORDER — KETOROLAC TROMETHAMINE 30 MG/ML IJ SOLN
30.0000 mg | Freq: Once | INTRAMUSCULAR | Status: AC
  Administered 2022-06-07: 30 mg via INTRAVENOUS
  Filled 2022-06-07: qty 1

## 2022-06-07 MED ORDER — OXYCODONE-ACETAMINOPHEN 5-325 MG PO TABS
1.0000 | ORAL_TABLET | Freq: Once | ORAL | Status: AC
  Administered 2022-06-07: 1 via ORAL
  Filled 2022-06-07: qty 1

## 2022-06-07 MED ORDER — FENTANYL CITRATE PF 50 MCG/ML IJ SOSY
50.0000 ug | PREFILLED_SYRINGE | Freq: Once | INTRAMUSCULAR | Status: AC
  Administered 2022-06-07: 50 ug via INTRAVENOUS
  Filled 2022-06-07: qty 1

## 2022-06-07 MED ORDER — METHOCARBAMOL 500 MG PO TABS
1000.0000 mg | ORAL_TABLET | Freq: Once | ORAL | Status: AC
  Administered 2022-06-07: 1000 mg via ORAL
  Filled 2022-06-07: qty 2

## 2022-06-07 MED ORDER — POTASSIUM CHLORIDE CRYS ER 20 MEQ PO TBCR: 20.0000 meq | EXTENDED_RELEASE_TABLET | Freq: Every day | ORAL | 0 refills | Status: DC

## 2022-06-07 MED ORDER — LACTATED RINGERS IV BOLUS
1000.0000 mL | Freq: Once | INTRAVENOUS | Status: AC
  Administered 2022-06-07: 1000 mL via INTRAVENOUS

## 2022-06-07 MED ORDER — POTASSIUM CHLORIDE CRYS ER 20 MEQ PO TBCR
40.0000 meq | EXTENDED_RELEASE_TABLET | Freq: Once | ORAL | Status: AC
  Administered 2022-06-07: 40 meq via ORAL
  Filled 2022-06-07: qty 2

## 2022-06-07 NOTE — ED Provider Notes (Signed)
[facility] EMERGENCY DEPT Provider Note   CSN: [id number] Arrival date & time: 06/07/22  0231     History  Chief Complaint  Patient presents with   Leg Spams    Shane Holmes is a 36 y.o. male.  Patient presents to the emergency department for evaluation of leg spasms.  Patient reports that he has leg spasms every day when he lays down at night.       Home Medications Prior to Admission medications   Medication Sig Start Date End Date Taking? Authorizing Provider  methocarbamol (ROBAXIN) 500 MG tablet Take 1 tablet (500 mg total) by mouth every 8 (eight) hours as needed for muscle spasms. 06/07/22  Yes Harleigh Civello, Canary Brim, MD  potassium chloride SA (KLOR-CON M) 20 MEQ tablet Take 1 tablet (20 mEq total) by mouth daily. 06/07/22  Yes Valera Vallas, Canary Brim, MD  albuterol (VENTOLIN HFA) 108 (90 Base) MCG/ACT inhaler INHALE 2 PUFFS INTO THE LUNGS EVERY SIX HOURS AS NEEDED FOR WHEEZING OR SHORTNESS OF BREATH. 01/26/21 01/26/22  Ghimire, Werner Lean, MD  folic acid (FOLVITE) 1 MG tablet Take 1 tablet (1 mg total) by mouth daily. Patient not taking: Reported on 01/10/2022 01/26/21   Maretta Bees, MD  gabapentin (NEURONTIN) 400 MG capsule Take 1 capsule (400 mg total) by mouth 3 (three) times daily. 01/26/21   Ghimire, Werner Lean, MD  ibuprofen (ADVIL) 200 MG tablet Take 400-600 mg by mouth every 6 (six) hours as needed for fever, headache or mild pain.    [provider]  metFORMIN (GLUCOPHAGE) 500 MG tablet Take 1 tablet (500 mg total) by mouth 2 (two) times daily with a meal. 05/23/22 06/22/22  Pollyann Savoy, MD  Multiple Vitamin (MULTIVITAMIN WITH MINERALS) TABS tablet Take 1 tablet by mouth daily. 01/27/21   Ghimire, Werner Lean, MD  thiamine 100 MG tablet Take 1 tablet (100 mg total) by mouth daily. Patient not taking: Reported on 01/10/2022 01/27/21   Maretta Bees, MD  vitamin B-12 (CYANOCOBALAMIN) 1000 MCG tablet Take 1 tablet (1,000 mcg total) by mouth  daily. Patient not taking: Reported on 01/10/2022 01/26/21   Maretta Bees, MD  dicyclomine (BENTYL) 20 MG tablet Take 1 tablet (20 mg total) by mouth 2 (two) times daily. Patient not taking: Reported on 11/02/2019 03/02/14 11/03/19  [name], PA-C      Allergies    Lidocaine-glycerin    Review of Systems   Review of Systems  Physical Exam Updated Vital Signs BP 109/70   Pulse 95   Temp (P) 97.7 F (36.5 C) (Oral)   Resp 20   SpO2 95%  Physical Exam Vitals and nursing note reviewed.  Constitutional:      General: He is not in acute distress.    Appearance: He is well-developed.  HENT:     Head: Normocephalic and atraumatic.     Mouth/Throat:     Mouth: Mucous membranes are moist.  Eyes:     General: Vision grossly intact. Gaze aligned appropriately.     Extraocular Movements: Extraocular movements intact.     Conjunctiva/sclera: Conjunctivae normal.  Cardiovascular:     Rate and Rhythm: Normal rate and regular rhythm.     Pulses: Normal pulses.     Heart sounds: Normal heart sounds, S1 normal and S2 normal. No murmur heard.    No friction rub. No gallop.  Pulmonary:     Effort: Pulmonary effort is normal. No respiratory distress.     Breath sounds:  Normal breath sounds.  Abdominal:     Palpations: Abdomen is soft.     Tenderness: There is no abdominal tenderness. There is no guarding or rebound.     Hernia: No hernia is present.  Musculoskeletal:        General: No swelling.     Cervical back: Full passive range of motion without pain, normal range of motion and neck supple. No pain with movement, spinous process tenderness or muscular tenderness. Normal range of motion.     Right upper leg: Tenderness present.     Left upper leg: Tenderness present.     Right lower leg: Tenderness present. No edema.     Left lower leg: Tenderness present. No edema.  Skin:    General: Skin is warm and dry.     Capillary Refill: Capillary refill takes less than 2 seconds.      Findings: No ecchymosis, erythema, lesion or wound.  Neurological:     Mental Status: He is alert and oriented to person, place, and time.     GCS: GCS eye subscore is 4. GCS verbal subscore is 5. GCS motor subscore is 6.     Cranial Nerves: Cranial nerves 2-12 are intact.     Sensory: Sensation is intact.     Motor: Motor function is intact. No weakness or abnormal muscle tone.     Coordination: Coordination is intact.  Psychiatric:        Mood and Affect: Mood normal.        Speech: Speech normal.        Behavior: Behavior normal.     ED Results / Procedures / Treatments   Labs (all labs ordered are listed, but only abnormal results are displayed) Labs Reviewed  CBC WITH DIFFERENTIAL/PLATELET - Abnormal; Notable for the following components:      Result Value   RBC 3.50 (*)    Hemoglobin 10.7 (*)    HCT 32.1 (*)    RDW 16.6 (*)    Platelets 138 (*)    All other components within normal limits  COMPREHENSIVE METABOLIC PANEL - Abnormal; Notable for the following components:   Potassium 3.1 (*)    Chloride 95 (*)    Glucose, Bld 300 (*)    BUN 5 (*)    Creatinine, Ser 0.54 (*)    AST 107 (*)    Alkaline Phosphatase 171 (*)    Total Bilirubin 2.5 (*)    All other components within normal limits  ETHANOL - Abnormal; Notable for the following components:   Alcohol, Ethyl (B) 202 (*)    All other components within normal limits  CK  RAPID URINE DRUG SCREEN, HOSP PERFORMED    EKG None  Radiology No results found.  Procedures Procedures    Medications Ordered in ED Medications  potassium chloride SA (KLOR-CON M) CR tablet 40 mEq (has no administration in time range)  lactated ringers bolus 1,000 mL (0 mLs Intravenous Stopped 06/07/22 0430)  oxyCODONE-acetaminophen (PERCOCET/ROXICET) 5-325 MG per tablet 1 tablet (1 tablet Oral Given 06/07/22 0315)  ketorolac (TORADOL) 30 MG/ML injection 30 mg (30 mg Intravenous Given 06/07/22 0453)  methocarbamol (ROBAXIN) tablet 1,000  mg (1,000 mg Oral Given 06/07/22 0453)  fentaNYL (SUBLIMAZE) injection 50 mcg (50 mcg Intravenous Given 06/07/22 0453)    ED Course/ Medical Decision Making/ A&P                           Medical  Decision Making Amount and/or Complexity of Data Reviewed Labs: ordered.  Risk Prescription drug management.   Patient presents to the emergency department for evaluation of muscle spasms in his legs.  Patient reports that this has been happening for a while.  Patient reports that he was hospitalized once for.  Reviewing his records he was hospitalized because of GI bleeding.  During that time he had muscle spasms with leg weakness.  He had a work-up including neurology evaluation and LP at that time.  Diagnosis was not clear.  It appears that he did not do the follow-up as an outpatient.  Patient experiencing intermittent episodes of myoclonus and experiencing leg pain.  Symptoms are bilateral.  No obvious external trauma.  No swelling.  Good pulses bilaterally.  Normal sensation bilaterally.  Records from previous hospitalization indicate hyponatremia and hypokalemia.  Electrolytes are fairly normal except for slightly reduced potassium, will replace.  Patient given IV fluids.  No evidence of rhabdomyolysis or compartment syndromes.  Etiology at this point is unclear, will refer to neurology for outpatient follow-up.        Final Clinical Impression(s) / ED Diagnoses Final diagnoses:  Muscle spasm  Hypokalemia    Rx / DC Orders ED Discharge Orders          Ordered    Ambulatory referral to Neurology       Comments: An appointment is requested in approximately: 2 weeks   06/07/22 0448    methocarbamol (ROBAXIN) 500 MG tablet  Every 8 hours PRN        06/07/22 0449    potassium chloride SA (KLOR-CON M) 20 MEQ tablet  Daily        06/07/22 0450              Gilda Crease, MD 06/07/22 [phone]

## 2022-06-07 NOTE — ED Notes (Signed)
Late entry -- Entered room to find pt lying awake and alert in bed with visitor at bedside-- educated pt on plan for bloodwork, urine sample and IVF -- pt then inquired if pain medicine would be ordered- when I explained that pain medication had not yet been ordered pt began to express frustration at not being prescribed pain med- pt initially refusing labs/PIV and has  threatened to leave and go to another hospital -- stating he wants to know what's causing symptoms but also wants help for pain.  This nurse reached out to provider via secure chat and provider subsequently has ordered percocet -- pt updated on pain med order and after med administered patient agreeable to continue with work up.   Have discussed pt history of being told symptoms of BLE jerking/spasms being related to neuropathy -- expressed to patient that neuropathy typically involves nerve related pain but does not normally cause jerking -- inquired if pt had ever been diagnosed with RLS which pt states he had and had previously been prescribed a robaxin to manage - pt also reports h/o spinal mass, bulging disc and ?sciatica

## 2022-06-07 NOTE — ED Notes (Signed)
Pt agreeable with d/c plan as discussed by provider- this nurse to verbally reinforce d/c instructions and provide pt with written copy -- pt significant other at bedside and will be patient driver at discharge

## 2022-06-07 NOTE — ED Notes (Signed)
LR bolus now infusing; lab results pending and urine collection still pending-- pt has been provided with urinal.  Call bell in reach.  Will monitor for acute changes and maintain plan of care.

## 2022-06-07 NOTE — ED Notes (Signed)
ED Provider at bedside. 

## 2022-06-07 NOTE — ED Triage Notes (Signed)
Pt returning d/t worsening chronic bilateral leg spasms ongoing for the past year. Has had neuro consult and MRIs. Endorses continues ETOH 6 beers/day. Last drink 4 hours ago. States he has been told ETOH exacerbates symptoms and is trying to wean down intake. Take OTC tylenol/ibuprofen without relief.

## 2022-06-12 ENCOUNTER — Encounter: Payer: Self-pay | Admitting: Neurology

## 2022-06-18 ENCOUNTER — Other Ambulatory Visit: Payer: Self-pay

## 2022-06-18 ENCOUNTER — Inpatient Hospital Stay (HOSPITAL_COMMUNITY)
Admission: EM | Admit: 2022-06-18 | Discharge: 2022-06-20 | DRG: 378 | Disposition: A | Discharge status: Home / Self Care | Payer: Self-pay | Attending: Family Medicine | Admitting: Family Medicine

## 2022-06-18 ENCOUNTER — Encounter (HOSPITAL_COMMUNITY): Payer: Self-pay | Admitting: Emergency Medicine

## 2022-06-18 DIAGNOSIS — K922 Gastrointestinal hemorrhage, unspecified: Secondary | ICD-10-CM | POA: Diagnosis present

## 2022-06-18 DIAGNOSIS — F17210 Nicotine dependence, cigarettes, uncomplicated: Secondary | ICD-10-CM | POA: Diagnosis present

## 2022-06-18 DIAGNOSIS — K264 Chronic or unspecified duodenal ulcer with hemorrhage: Secondary | ICD-10-CM | POA: Diagnosis present

## 2022-06-18 DIAGNOSIS — E860 Dehydration: Secondary | ICD-10-CM | POA: Diagnosis present

## 2022-06-18 DIAGNOSIS — J45909 Unspecified asthma, uncomplicated: Secondary | ICD-10-CM | POA: Diagnosis present

## 2022-06-18 DIAGNOSIS — Z8616 Personal history of COVID-19: Secondary | ICD-10-CM

## 2022-06-18 DIAGNOSIS — D62 Acute posthemorrhagic anemia: Secondary | ICD-10-CM | POA: Diagnosis present

## 2022-06-18 DIAGNOSIS — K449 Diaphragmatic hernia without obstruction or gangrene: Secondary | ICD-10-CM | POA: Diagnosis present

## 2022-06-18 DIAGNOSIS — I700 Atherosclerosis of aorta: Secondary | ICD-10-CM | POA: Diagnosis present

## 2022-06-18 DIAGNOSIS — D649 Anemia, unspecified: Secondary | ICD-10-CM | POA: Diagnosis present

## 2022-06-18 DIAGNOSIS — E1142 Type 2 diabetes mellitus with diabetic polyneuropathy: Secondary | ICD-10-CM | POA: Diagnosis present

## 2022-06-18 DIAGNOSIS — I8510 Secondary esophageal varices without bleeding: Secondary | ICD-10-CM | POA: Diagnosis present

## 2022-06-18 DIAGNOSIS — K921 Melena: Secondary | ICD-10-CM

## 2022-06-18 DIAGNOSIS — F1010 Alcohol abuse, uncomplicated: Secondary | ICD-10-CM | POA: Diagnosis present

## 2022-06-18 DIAGNOSIS — I10 Essential (primary) hypertension: Secondary | ICD-10-CM | POA: Diagnosis present

## 2022-06-18 DIAGNOSIS — K766 Portal hypertension: Secondary | ICD-10-CM | POA: Diagnosis present

## 2022-06-18 DIAGNOSIS — K2211 Ulcer of esophagus with bleeding: Secondary | ICD-10-CM | POA: Diagnosis present

## 2022-06-18 DIAGNOSIS — R739 Hyperglycemia, unspecified: Secondary | ICD-10-CM | POA: Diagnosis present

## 2022-06-18 DIAGNOSIS — Z635 Disruption of family by separation and divorce: Secondary | ICD-10-CM

## 2022-06-18 DIAGNOSIS — K700 Alcoholic fatty liver: Secondary | ICD-10-CM | POA: Diagnosis present

## 2022-06-18 DIAGNOSIS — K3189 Other diseases of stomach and duodenum: Secondary | ICD-10-CM | POA: Diagnosis present

## 2022-06-18 DIAGNOSIS — D72829 Elevated white blood cell count, unspecified: Secondary | ICD-10-CM | POA: Diagnosis present

## 2022-06-18 DIAGNOSIS — D6959 Other secondary thrombocytopenia: Secondary | ICD-10-CM | POA: Diagnosis present

## 2022-06-18 DIAGNOSIS — G629 Polyneuropathy, unspecified: Secondary | ICD-10-CM

## 2022-06-18 DIAGNOSIS — Z7984 Long term (current) use of oral hypoglycemic drugs: Secondary | ICD-10-CM

## 2022-06-18 DIAGNOSIS — E538 Deficiency of other specified B group vitamins: Secondary | ICD-10-CM | POA: Diagnosis present

## 2022-06-18 DIAGNOSIS — K254 Chronic or unspecified gastric ulcer with hemorrhage: Principal | ICD-10-CM | POA: Diagnosis present

## 2022-06-18 DIAGNOSIS — E1165 Type 2 diabetes mellitus with hyperglycemia: Secondary | ICD-10-CM | POA: Diagnosis present

## 2022-06-18 DIAGNOSIS — Z79899 Other long term (current) drug therapy: Secondary | ICD-10-CM

## 2022-06-18 DIAGNOSIS — R1013 Epigastric pain: Secondary | ICD-10-CM | POA: Diagnosis present

## 2022-06-18 DIAGNOSIS — K7460 Unspecified cirrhosis of liver: Secondary | ICD-10-CM | POA: Diagnosis present

## 2022-06-18 LAB — CBC WITH DIFFERENTIAL/PLATELET
Abs Immature Granulocytes: 0.05 10*3/uL (ref 0.00–0.07)
Basophils Absolute: 0.1 10*3/uL (ref 0.0–0.1)
Basophils Relative: 1 %
Eosinophils Absolute: 0.1 10*3/uL (ref 0.0–0.5)
Eosinophils Relative: 1 %
HCT: 32.9 % — ABNORMAL LOW (ref 39.0–52.0)
Hemoglobin: 10.6 g/dL — ABNORMAL LOW (ref 13.0–17.0)
Immature Granulocytes: 1 %
Lymphocytes Relative: 16 %
Lymphs Abs: 1.7 10*3/uL (ref 0.7–4.0)
MCH: 31.5 pg (ref 26.0–34.0)
MCHC: 32.2 g/dL (ref 30.0–36.0)
MCV: 97.6 fL (ref 80.0–100.0)
Monocytes Absolute: 0.9 10*3/uL (ref 0.1–1.0)
Monocytes Relative: 8 %
Neutro Abs: 7.8 10*3/uL — ABNORMAL HIGH (ref 1.7–7.7)
Neutrophils Relative %: 73 %
Platelets: 133 10*3/uL — ABNORMAL LOW (ref 150–400)
RBC: 3.37 MIL/uL — ABNORMAL LOW (ref 4.22–5.81)
RDW: 14.5 % (ref 11.5–15.5)
WBC: 10.6 10*3/uL — ABNORMAL HIGH (ref 4.0–10.5)
nRBC: 0 % (ref 0.0–0.2)

## 2022-06-18 LAB — COMPREHENSIVE METABOLIC PANEL
ALT: 21 U/L (ref 0–44)
AST: 96 U/L — ABNORMAL HIGH (ref 15–41)
Albumin: 3.2 g/dL — ABNORMAL LOW (ref 3.5–5.0)
Alkaline Phosphatase: 160 U/L — ABNORMAL HIGH (ref 38–126)
Anion gap: 14 (ref 5–15)
BUN: 9 mg/dL (ref 6–20)
CO2: 24 mmol/L (ref 22–32)
Calcium: 8.5 mg/dL — ABNORMAL LOW (ref 8.9–10.3)
Chloride: 98 mmol/L (ref 98–111)
Creatinine, Ser: 0.61 mg/dL (ref 0.61–1.24)
GFR, Estimated: >60 mL/min (ref >60)
Glucose, Bld: 313 mg/dL — ABNORMAL HIGH (ref 70–99)
Potassium: 3.9 mmol/L (ref 3.5–5.1)
Sodium: 136 mmol/L (ref 135–145)
Total Bilirubin: 1.8 mg/dL — ABNORMAL HIGH (ref 0.3–1.2)
Total Protein: 7.3 g/dL (ref 6.5–8.1)

## 2022-06-18 LAB — LIPASE, BLOOD: Lipase: 64 U/L — ABNORMAL HIGH (ref 11–51)

## 2022-06-18 MED ORDER — LACTATED RINGERS IV BOLUS
1000.0000 mL | Freq: Once | INTRAVENOUS | Status: AC
  Administered 2022-06-19: 1000 mL via INTRAVENOUS

## 2022-06-18 MED ORDER — PANTOPRAZOLE SODIUM 40 MG IV SOLR: 40.0000 mg | Freq: Two times a day (BID) | INTRAVENOUS | Status: DC

## 2022-06-18 MED ORDER — PANTOPRAZOLE INFUSION (NEW) - SIMPLE MED
8.0000 mg/h | INTRAVENOUS | Status: DC
  Administered 2022-06-19 (×3): 8 mg/h via INTRAVENOUS
  Filled 2022-06-18 (×2): qty 80
  Filled 2022-06-18 (×2): qty 100
  Filled 2022-06-18: qty 80

## 2022-06-18 MED ORDER — ONDANSETRON HCL 4 MG/2ML IJ SOLN
4.0000 mg | Freq: Once | INTRAMUSCULAR | Status: AC
  Administered 2022-06-19: 4 mg via INTRAVENOUS
  Filled 2022-06-18: qty 2

## 2022-06-18 MED ORDER — HYDROMORPHONE HCL 1 MG/ML IJ SOLN
1.0000 mg | Freq: Once | INTRAMUSCULAR | Status: AC
  Administered 2022-06-19: 1 mg via INTRAVENOUS
  Filled 2022-06-18: qty 1

## 2022-06-18 MED ORDER — NICOTINE 21 MG/24HR TD PT24
21.0000 mg | MEDICATED_PATCH | Freq: Once | TRANSDERMAL | Status: DC
  Administered 2022-06-19: 21 mg via TRANSDERMAL
  Filled 2022-06-18: qty 1

## 2022-06-18 MED ORDER — PANTOPRAZOLE 80MG IVPB - SIMPLE MED
80.0000 mg | Freq: Once | INTRAVENOUS | Status: AC
  Administered 2022-06-19: 80 mg via INTRAVENOUS
  Filled 2022-06-18: qty 80

## 2022-06-18 MED ORDER — OXYCODONE-ACETAMINOPHEN 5-325 MG PO TABS
1.0000 | ORAL_TABLET | Freq: Once | ORAL | Status: AC
  Administered 2022-06-18: 1 via ORAL
  Filled 2022-06-18: qty 1

## 2022-06-18 NOTE — ED Triage Notes (Signed)
Patient reports chronic legs pain worse this week ; hematemesis and dark stools for several days with upper abdominal pain .

## 2022-06-18 NOTE — ED Provider Triage Note (Signed)
Emergency Medicine Provider Triage Evaluation Note  Shane Holmes , a 36 y.o. male  was evaluated in triage.  Pt complains of abdominal pain, hematemesis and melena. States that same began 3 days ago and has been persistent since. States he is throwing up significant amounts of blood and has pain throughout his abdomen. Hx of alcohol abuse, states that he is trying to reduce his daily alcohol intake but still had 2 beers to drink today. No hx of abdominal surgeries.   Review of Systems  Positive:  Negative:   Physical Exam  BP (!) 170/110 (BP Location: Right Arm)   Pulse (!) 120   Temp 98.1 F (36.7 C)   Resp 16   SpO2 99%  Gen:   Awake, no distress  in obvious discomfort Resp:  Normal effort  MSK:   Moves extremities without difficulty  Other:  Abdominal tenderness with distention. Not currently vomiting  Medical Decision Making  Medically screening exam initiated at 9:44 PM.  Appropriate orders placed.  Shane Holmes was informed that the remainder of the evaluation will be completed by another provider, this initial triage assessment does not replace that evaluation, and the importance of remaining in the ED until their evaluation is complete.  Work-up initiated   Shane Holmes 06/18/22 2147

## 2022-06-18 NOTE — ED Provider Notes (Signed)
[facility] EMERGENCY DEPARTMENT Provider Note   CSN: [id number] Arrival date & time: 06/18/22  2117     History {Add pertinent medical, surgical, social history, OB history to HPI:1} Chief Complaint  Patient presents with   Abdominal Pain / GI Bleed/Legs pain     Shane Holmes is a 36 y.o. male.  Patient presents to the emergency department for evaluation of abdominal pain.  Patient reports that he has been having progressively worsening mid abdominal pain for 3 days.  He has been experiencing vomiting of maroon blood in his stools have been black.       Home Medications Prior to Admission medications   Medication Sig Start Date End Date Taking? Authorizing Provider  albuterol (VENTOLIN HFA) 108 (90 Base) MCG/ACT inhaler INHALE 2 PUFFS INTO THE LUNGS EVERY SIX HOURS AS NEEDED FOR WHEEZING OR SHORTNESS OF BREATH. 01/26/21 01/26/22  Ghimire, Werner Lean, MD  folic acid (FOLVITE) 1 MG tablet Take 1 tablet (1 mg total) by mouth daily. Patient not taking: Reported on 01/10/2022 01/26/21   Maretta Bees, MD  gabapentin (NEURONTIN) 400 MG capsule Take 1 capsule (400 mg total) by mouth 3 (three) times daily. 01/26/21   Ghimire, Werner Lean, MD  ibuprofen (ADVIL) 200 MG tablet Take 400-600 mg by mouth every 6 (six) hours as needed for fever, headache or mild pain.    [provider]  metFORMIN (GLUCOPHAGE) 500 MG tablet Take 1 tablet (500 mg total) by mouth 2 (two) times daily with a meal. 05/23/22 06/22/22  Pollyann Savoy, MD  methocarbamol (ROBAXIN) 500 MG tablet Take 1 tablet (500 mg total) by mouth every 8 (eight) hours as needed for muscle spasms. 06/07/22   Gilda Crease, MD  Multiple Vitamin (MULTIVITAMIN WITH MINERALS) TABS tablet Take 1 tablet by mouth daily. 01/27/21   Ghimire, Werner Lean, MD  potassium chloride SA (KLOR-CON M) 20 MEQ tablet Take 1 tablet (20 mEq total) by mouth daily. 06/07/22   Gilda Crease, MD  thiamine 100 MG tablet Take 1  tablet (100 mg total) by mouth daily. Patient not taking: Reported on 01/10/2022 01/27/21   Maretta Bees, MD  vitamin B-12 (CYANOCOBALAMIN) 1000 MCG tablet Take 1 tablet (1,000 mcg total) by mouth daily. Patient not taking: Reported on 01/10/2022 01/26/21   Maretta Bees, MD  dicyclomine (BENTYL) 20 MG tablet Take 1 tablet (20 mg total) by mouth 2 (two) times daily. Patient not taking: Reported on 11/02/2019 03/02/14 11/03/19  [name], PA-C      Allergies    Lidocaine-glycerin    Review of Systems   Review of Systems  Physical Exam Updated Vital Signs BP 124/90   Pulse (!) 109   Temp 98.1 F (36.7 C)   Resp 14   SpO2 97%  Physical Exam Vitals and nursing note reviewed.  Constitutional:      General: He is not in acute distress.    Appearance: He is well-developed.  HENT:     Head: Normocephalic and atraumatic.     Mouth/Throat:     Mouth: Mucous membranes are moist.  Eyes:     General: Vision grossly intact. Gaze aligned appropriately.     Extraocular Movements: Extraocular movements intact.     Conjunctiva/sclera: Conjunctivae normal.  Cardiovascular:     Rate and Rhythm: Normal rate and regular rhythm.     Pulses: Normal pulses.     Heart sounds: Normal heart sounds, S1 normal and S2 normal. No  murmur heard.    No friction rub. No gallop.  Pulmonary:     Effort: Pulmonary effort is normal. No respiratory distress.     Breath sounds: Normal breath sounds.  Abdominal:     General: There is distension.     Palpations: Abdomen is soft.     Tenderness: There is abdominal tenderness. There is no guarding or rebound.     Hernia: No hernia is present.  Musculoskeletal:        General: No swelling.     Cervical back: Full passive range of motion without pain, normal range of motion and neck supple. No pain with movement, spinous process tenderness or muscular tenderness. Normal range of motion.     Right lower leg: No edema.     Left lower leg: No edema.   Skin:    General: Skin is warm and dry.     Capillary Refill: Capillary refill takes less than 2 seconds.     Findings: No ecchymosis, erythema, lesion or wound.  Neurological:     Mental Status: He is alert and oriented to person, place, and time.     GCS: GCS eye subscore is 4. GCS verbal subscore is 5. GCS motor subscore is 6.     Cranial Nerves: Cranial nerves 2-12 are intact.     Sensory: Sensation is intact.     Motor: Motor function is intact. No weakness or abnormal muscle tone.     Coordination: Coordination is intact.  Psychiatric:        Mood and Affect: Mood normal.        Speech: Speech normal.        Behavior: Behavior normal.     ED Results / Procedures / Treatments   Labs (all labs ordered are listed, but only abnormal results are displayed) Labs Reviewed  COMPREHENSIVE METABOLIC PANEL - Abnormal; Notable for the following components:      Result Value   Glucose, Bld 313 (*)    Calcium 8.5 (*)    Albumin 3.2 (*)    AST 96 (*)    Alkaline Phosphatase 160 (*)    Total Bilirubin 1.8 (*)    All other components within normal limits  LIPASE, BLOOD - Abnormal; Notable for the following components:   Lipase 64 (*)    All other components within normal limits  CBC WITH DIFFERENTIAL/PLATELET - Abnormal; Notable for the following components:   WBC 10.6 (*)    RBC 3.37 (*)    Hemoglobin 10.6 (*)    HCT 32.9 (*)    Platelets 133 (*)    Neutro Abs 7.8 (*)    All other components within normal limits  URINALYSIS, ROUTINE W REFLEX MICROSCOPIC  PROTIME-INR  POC OCCULT BLOOD, ED  TYPE AND SCREEN    EKG None  Radiology No results found.  Procedures Procedures  {Document cardiac monitor, telemetry assessment procedure when appropriate:1}  Medications Ordered in ED Medications  lactated ringers bolus 1,000 mL (has no administration in time range)  pantoprazole (PROTONIX) 80 mg /NS 100 mL IVPB (has no administration in time range)  pantoprozole (PROTONIX) 80  mg /NS 100 mL infusion (has no administration in time range)  pantoprazole (PROTONIX) injection 40 mg (has no administration in time range)  HYDROmorphone (DILAUDID) injection 1 mg (has no administration in time range)  ondansetron (ZOFRAN) injection 4 mg (has no administration in time range)  oxyCODONE-acetaminophen (PERCOCET/ROXICET) 5-325 MG per tablet 1 tablet (1 tablet Oral Given 06/18/22 2146)  ED Course/ Medical Decision Making/ A&P                           Medical Decision Making Amount and/or Complexity of Data Reviewed Labs: ordered.  Risk Prescription drug management.   ***  {Document critical care time when appropriate:1} {Document review of labs and clinical decision tools ie heart score, Chads2Vasc2 etc:1}  {Document your independent review of radiology images, and any outside records:1} {Document your discussion with family members, caretakers, and with consultants:1} {Document social determinants of health affecting pt's care:1} {Document your decision making why or why not admission, treatments were needed:1} Final Clinical Impression(s) / ED Diagnoses Final diagnoses:  None    Rx / DC Orders ED Discharge Orders     None

## 2022-06-19 ENCOUNTER — Emergency Department (HOSPITAL_COMMUNITY): Payer: Self-pay

## 2022-06-19 ENCOUNTER — Encounter (HOSPITAL_COMMUNITY): Payer: Self-pay | Admitting: Internal Medicine

## 2022-06-19 ENCOUNTER — Encounter (HOSPITAL_COMMUNITY)
Admission: EM | Disposition: A | Discharge status: Home / Self Care | Payer: Self-pay | Source: Home / Self Care | Attending: Family Medicine

## 2022-06-19 ENCOUNTER — Observation Stay (HOSPITAL_COMMUNITY): Payer: Self-pay | Admitting: Anesthesiology

## 2022-06-19 DIAGNOSIS — R1013 Epigastric pain: Secondary | ICD-10-CM | POA: Diagnosis present

## 2022-06-19 DIAGNOSIS — E860 Dehydration: Secondary | ICD-10-CM

## 2022-06-19 DIAGNOSIS — K921 Melena: Secondary | ICD-10-CM

## 2022-06-19 DIAGNOSIS — D649 Anemia, unspecified: Secondary | ICD-10-CM | POA: Diagnosis present

## 2022-06-19 DIAGNOSIS — D72829 Elevated white blood cell count, unspecified: Secondary | ICD-10-CM | POA: Diagnosis present

## 2022-06-19 DIAGNOSIS — K269 Duodenal ulcer, unspecified as acute or chronic, without hemorrhage or perforation: Secondary | ICD-10-CM

## 2022-06-19 DIAGNOSIS — I8510 Secondary esophageal varices without bleeding: Secondary | ICD-10-CM

## 2022-06-19 DIAGNOSIS — R739 Hyperglycemia, unspecified: Secondary | ICD-10-CM | POA: Diagnosis present

## 2022-06-19 DIAGNOSIS — K3189 Other diseases of stomach and duodenum: Secondary | ICD-10-CM

## 2022-06-19 DIAGNOSIS — K2210 Ulcer of esophagus without bleeding: Secondary | ICD-10-CM

## 2022-06-19 DIAGNOSIS — K766 Portal hypertension: Secondary | ICD-10-CM

## 2022-06-19 DIAGNOSIS — K920 Hematemesis: Secondary | ICD-10-CM

## 2022-06-19 DIAGNOSIS — K922 Gastrointestinal hemorrhage, unspecified: Secondary | ICD-10-CM | POA: Diagnosis present

## 2022-06-19 DIAGNOSIS — G629 Polyneuropathy, unspecified: Secondary | ICD-10-CM

## 2022-06-19 DIAGNOSIS — F1010 Alcohol abuse, uncomplicated: Secondary | ICD-10-CM

## 2022-06-19 DIAGNOSIS — D62 Acute posthemorrhagic anemia: Secondary | ICD-10-CM

## 2022-06-19 DIAGNOSIS — R7401 Elevation of levels of liver transaminase levels: Secondary | ICD-10-CM

## 2022-06-19 HISTORY — PX: ESOPHAGOGASTRODUODENOSCOPY (EGD) WITH PROPOFOL: SHX5813

## 2022-06-19 HISTORY — PX: BIOPSY: SHX5522

## 2022-06-19 LAB — COMPREHENSIVE METABOLIC PANEL
ALT: 21 U/L (ref 0–44)
AST: 97 U/L — ABNORMAL HIGH (ref 15–41)
Albumin: 2.9 g/dL — ABNORMAL LOW (ref 3.5–5.0)
Alkaline Phosphatase: 134 U/L — ABNORMAL HIGH (ref 38–126)
Anion gap: 8 (ref 5–15)
BUN: 10 mg/dL (ref 6–20)
CO2: 27 mmol/L (ref 22–32)
Calcium: 7.8 mg/dL — ABNORMAL LOW (ref 8.9–10.3)
Chloride: 101 mmol/L (ref 98–111)
Creatinine, Ser: 0.48 mg/dL — ABNORMAL LOW (ref 0.61–1.24)
GFR, Estimated: >60 mL/min (ref >60)
Glucose, Bld: 177 mg/dL — ABNORMAL HIGH (ref 70–99)
Potassium: 3.5 mmol/L (ref 3.5–5.1)
Sodium: 136 mmol/L (ref 135–145)
Total Bilirubin: 2.1 mg/dL — ABNORMAL HIGH (ref 0.3–1.2)
Total Protein: 7 g/dL (ref 6.5–8.1)

## 2022-06-19 LAB — CBC WITH DIFFERENTIAL/PLATELET
Abs Immature Granulocytes: 0.04 10*3/uL (ref 0.00–0.07)
Basophils Absolute: 0 10*3/uL (ref 0.0–0.1)
Basophils Relative: 1 %
Eosinophils Absolute: 0.1 10*3/uL (ref 0.0–0.5)
Eosinophils Relative: 1 %
HCT: 28.9 % — ABNORMAL LOW (ref 39.0–52.0)
Hemoglobin: 9.7 g/dL — ABNORMAL LOW (ref 13.0–17.0)
Immature Granulocytes: 1 %
Lymphocytes Relative: 19 %
Lymphs Abs: 1.5 10*3/uL (ref 0.7–4.0)
MCH: 31.9 pg (ref 26.0–34.0)
MCHC: 33.6 g/dL (ref 30.0–36.0)
MCV: 95.1 fL (ref 80.0–100.0)
Monocytes Absolute: 0.7 10*3/uL (ref 0.1–1.0)
Monocytes Relative: 9 %
Neutro Abs: 5.2 10*3/uL (ref 1.7–7.7)
Neutrophils Relative %: 69 %
Platelets: 109 10*3/uL — ABNORMAL LOW (ref 150–400)
RBC: 3.04 MIL/uL — ABNORMAL LOW (ref 4.22–5.81)
RDW: 14.6 % (ref 11.5–15.5)
WBC: 7.5 10*3/uL (ref 4.0–10.5)
nRBC: 0 % (ref 0.0–0.2)

## 2022-06-19 LAB — URINALYSIS, ROUTINE W REFLEX MICROSCOPIC
Bacteria, UA: NONE SEEN
Bilirubin Urine: NEGATIVE
Glucose, UA: 150 mg/dL — AB
Hgb urine dipstick: NEGATIVE
Ketones, ur: NEGATIVE mg/dL
Leukocytes,Ua: NEGATIVE
Nitrite: NEGATIVE
Protein, ur: 30 mg/dL — AB
Specific Gravity, Urine: >1.046 — ABNORMAL HIGH (ref 1.005–1.030)
pH: 5 (ref 5.0–8.0)

## 2022-06-19 LAB — TYPE AND SCREEN
ABO/RH(D): O POS
Antibody Screen: NEGATIVE

## 2022-06-19 LAB — HEMOGLOBIN AND HEMATOCRIT, BLOOD
HCT: 28.7 % — ABNORMAL LOW (ref 39.0–52.0)
HCT: 28.2 % — ABNORMAL LOW (ref 39.0–52.0)
Hemoglobin: 9.5 g/dL — ABNORMAL LOW (ref 13.0–17.0)
Hemoglobin: 9.4 g/dL — ABNORMAL LOW (ref 13.0–17.0)

## 2022-06-19 LAB — CBG MONITORING, ED: Glucose-Capillary: 186 mg/dL — ABNORMAL HIGH (ref 70–99)

## 2022-06-19 LAB — PHOSPHORUS: Phosphorus: 3.4 mg/dL (ref 2.5–4.6)

## 2022-06-19 LAB — HEMOGLOBIN A1C
Hgb A1c MFr Bld: 7.4 % — ABNORMAL HIGH (ref 4.8–5.6)
Mean Plasma Glucose: 165.68 mg/dL

## 2022-06-19 LAB — GLUCOSE, CAPILLARY
Glucose-Capillary: 169 mg/dL — ABNORMAL HIGH (ref 70–99)
Glucose-Capillary: 132 mg/dL — ABNORMAL HIGH (ref 70–99)

## 2022-06-19 LAB — PROTIME-INR
INR: 1.2 (ref 0.8–1.2)
INR: 1.2 (ref 0.8–1.2)
Prothrombin Time: 15.3 seconds — ABNORMAL HIGH (ref 11.4–15.2)
Prothrombin Time: 15.4 seconds — ABNORMAL HIGH (ref 11.4–15.2)

## 2022-06-19 LAB — MAGNESIUM: Magnesium: 1.4 mg/dL — ABNORMAL LOW (ref 1.7–2.4)

## 2022-06-19 LAB — POC OCCULT BLOOD, ED: Fecal Occult Bld: POSITIVE — AB

## 2022-06-19 LAB — ETHANOL: Alcohol, Ethyl (B): <10 mg/dL (ref <10)

## 2022-06-19 SURGERY — ESOPHAGOGASTRODUODENOSCOPY (EGD) WITH PROPOFOL
Anesthesia: Monitor Anesthesia Care

## 2022-06-19 MED ORDER — NICOTINE 14 MG/24HR TD PT24
14.0000 mg | MEDICATED_PATCH | Freq: Every day | TRANSDERMAL | Status: DC
Start: 2022-06-19 — End: 2022-06-20
  Administered 2022-06-20 (×2): 14 mg via TRANSDERMAL
  Filled 2022-06-19 (×2): qty 1

## 2022-06-19 MED ORDER — PROPOFOL 10 MG/ML IV BOLUS
INTRAVENOUS | Status: DC | PRN
  Administered 2022-06-19 (×3): 50 mg via INTRAVENOUS

## 2022-06-19 MED ORDER — METOCLOPRAMIDE HCL 5 MG/ML IJ SOLN
5.0000 mg | Freq: Once | INTRAMUSCULAR | Status: AC
  Administered 2022-06-19: 5 mg via INTRAVENOUS
  Filled 2022-06-19: qty 2

## 2022-06-19 MED ORDER — INSULIN ASPART 100 UNIT/ML IJ SOLN
0.0000 [IU] | Freq: Four times a day (QID) | INTRAMUSCULAR | Status: DC
  Administered 2022-06-19: 1 [IU] via SUBCUTANEOUS

## 2022-06-19 MED ORDER — FENTANYL CITRATE PF 50 MCG/ML IJ SOSY
50.0000 ug | PREFILLED_SYRINGE | Freq: Once | INTRAMUSCULAR | Status: AC
  Administered 2022-06-19: 50 ug via INTRAVENOUS
  Filled 2022-06-19: qty 1

## 2022-06-19 MED ORDER — ACETAMINOPHEN 325 MG PO TABS
650.0000 mg | ORAL_TABLET | Freq: Four times a day (QID) | ORAL | Status: DC | PRN
  Administered 2022-06-19: 650 mg via ORAL
  Filled 2022-06-19: qty 2

## 2022-06-19 MED ORDER — NICOTINE POLACRILEX 2 MG MT GUM
2.0000 mg | CHEWING_GUM | OROMUCOSAL | Status: DC | PRN
  Filled 2022-06-19: qty 1

## 2022-06-19 MED ORDER — METHOCARBAMOL 500 MG PO TABS
500.0000 mg | ORAL_TABLET | Freq: Once | ORAL | Status: AC
  Administered 2022-06-19: 500 mg via ORAL

## 2022-06-19 MED ORDER — PROPOFOL 500 MG/50ML IV EMUL
INTRAVENOUS | Status: DC | PRN
  Administered 2022-06-19: 200 ug/kg/min via INTRAVENOUS
  Administered 2022-06-19: 150 ug/kg/min via INTRAVENOUS

## 2022-06-19 MED ORDER — MORPHINE SULFATE (PF) 2 MG/ML IV SOLN
0.5000 mg | Freq: Once | INTRAVENOUS | Status: AC
  Administered 2022-06-19: 0.5 mg via INTRAVENOUS
  Filled 2022-06-19: qty 1

## 2022-06-19 MED ORDER — ONDANSETRON HCL 4 MG/2ML IJ SOLN: 4.0000 mg | Freq: Four times a day (QID) | INTRAMUSCULAR | Status: DC | PRN

## 2022-06-19 MED ORDER — LORAZEPAM 1 MG PO TABS
1.0000 mg | ORAL_TABLET | ORAL | Status: DC | PRN
  Administered 2022-06-19 (×2): 2 mg via ORAL
  Filled 2022-06-19 (×2): qty 2

## 2022-06-19 MED ORDER — LACTATED RINGERS IV SOLN
INTRAVENOUS | Status: DC
Start: 2022-06-19 — End: 2022-06-20

## 2022-06-19 MED ORDER — IOHEXOL 300 MG/ML  SOLN
100.0000 mL | Freq: Once | INTRAMUSCULAR | Status: AC | PRN
  Administered 2022-06-19: 100 mL via INTRAVENOUS

## 2022-06-19 MED ORDER — PREGABALIN 25 MG PO CAPS
25.0000 mg | ORAL_CAPSULE | Freq: Two times a day (BID) | ORAL | Status: DC
  Administered 2022-06-19 – 2022-06-20 (×3): 25 mg via ORAL
  Filled 2022-06-19 (×3): qty 1

## 2022-06-19 MED ORDER — FOLIC ACID 1 MG PO TABS
1.0000 mg | ORAL_TABLET | Freq: Every day | ORAL | Status: DC
  Administered 2022-06-19 – 2022-06-20 (×2): 1 mg via ORAL
  Filled 2022-06-19 (×2): qty 1

## 2022-06-19 MED ORDER — ACETAMINOPHEN 650 MG RE SUPP: 650.0000 mg | Freq: Four times a day (QID) | RECTAL | Status: DC | PRN

## 2022-06-19 MED ORDER — LORAZEPAM 2 MG/ML IJ SOLN
1.0000 mg | INTRAMUSCULAR | Status: DC | PRN
  Administered 2022-06-19 – 2022-06-20 (×2): 2 mg via INTRAVENOUS
  Filled 2022-06-19 (×2): qty 1

## 2022-06-19 MED ORDER — ADULT MULTIVITAMIN W/MINERALS CH
1.0000 | ORAL_TABLET | Freq: Every day | ORAL | Status: DC
  Administered 2022-06-19 – 2022-06-20 (×2): 1 via ORAL
  Filled 2022-06-19 (×2): qty 1

## 2022-06-19 MED ORDER — THIAMINE HCL 100 MG/ML IJ SOLN: 100.0000 mg | Freq: Every day | INTRAMUSCULAR | Status: DC

## 2022-06-19 MED ORDER — METHOCARBAMOL 500 MG PO TABS
ORAL_TABLET | ORAL | Status: AC
  Filled 2022-06-19: qty 1

## 2022-06-19 MED ORDER — LIDOCAINE 2% (20 MG/ML) 5 ML SYRINGE
INTRAMUSCULAR | Status: DC | PRN
  Administered 2022-06-19: 100 mg via INTRAVENOUS

## 2022-06-19 MED ORDER — FENTANYL CITRATE PF 50 MCG/ML IJ SOSY: 50.0000 ug | PREFILLED_SYRINGE | INTRAMUSCULAR | Status: DC | PRN

## 2022-06-19 MED ORDER — NALOXONE HCL 0.4 MG/ML IJ SOLN: 0.4000 mg | INTRAMUSCULAR | Status: DC | PRN

## 2022-06-19 MED ORDER — THIAMINE HCL 100 MG PO TABS
100.0000 mg | ORAL_TABLET | Freq: Every day | ORAL | Status: DC
  Administered 2022-06-19 – 2022-06-20 (×2): 100 mg via ORAL
  Filled 2022-06-19 (×2): qty 1

## 2022-06-19 SURGICAL SUPPLY — 15 items

## 2022-06-19 NOTE — Transfer of Care (Signed)
Immediate Anesthesia Transfer of Care Note  Patient: Shane Holmes  Procedure(s) Performed: ESOPHAGOGASTRODUODENOSCOPY (EGD) WITH PROPOFOL BIOPSY  Patient Location: PACU  Anesthesia Type:MAC  Level of Consciousness: drowsy and patient cooperative  Airway & Oxygen Therapy: Patient Spontanous Breathing  Post-op Assessment: Report given to RN and Post -op Vital signs reviewed and stable  Post vital signs: Reviewed and stable  Last Vitals:  Vitals Value Taken Time  BP    Temp    Pulse    Resp    SpO2      Last Pain:  Vitals:   06/19/22 1130  TempSrc: Temporal  PainSc: 7          Complications: No notable events documented.

## 2022-06-19 NOTE — Op Note (Signed)
[facility] Patient Name: Shane Holmes Procedure Date : 06/19/2022 MRN: [id number] Attending MD: Estill Cotta. Loletha Carrow , MD Date of Birth: [birth year] CSN: [id number] Age: 36 Admit Type: Inpatient Procedure:                Upper GI endoscopy Indications:              Acute post hemorrhagic anemia, Hematemesis, Melena Providers:                Mallie Mussel L. Loletha Carrow, MD, Grace Isaac, RN, Benetta Spar, Technician Referring MD:             [facility] Medicines:                Monitored Anesthesia Care Complications:            No immediate complications. Estimated Blood Loss:     Estimated blood loss was minimal. Procedure:                Pre-Anesthesia Assessment:                           - Prior to the procedure, a History and Physical                            was performed, and patient medications and                            allergies were reviewed. [name] tolerance of                            previous anesthesia was also reviewed. The risks                            and benefits of the procedure and the sedation                            options and risks were discussed with the patient.                            All questions were answered, and informed consent                            was obtained. Prior Anticoagulants: The patient has                            taken no previous anticoagulant or antiplatelet                            agents. ASA Grade Assessment: III - A patient with                            severe systemic disease. After reviewing the risks  and benefits, the patient was deemed in                            satisfactory condition to undergo the procedure.                           After obtaining informed consent, the endoscope was                            passed under direct vision. Throughout the                            procedure, the patient's blood pressure, pulse, and                             oxygen saturations were monitored continuously. The                            [id number]-H190 ([id number] Olympus endoscope was introduced                            through the mouth, and advanced to the second part                            of duodenum. The upper GI endoscopy was                            accomplished without difficulty. The patient                            tolerated the procedure well. Scope In: Scope Out: Findings:      There were esophageal mucosal changes suspicious for long-segment       Barrett's esophagus present in the middle third of the esophagus and in       the lower third of the esophagus. The maximum longitudinal extent of       these mucosal changes was 10 cm in length. Two biopsies were taken with       a cold forceps for histology. Examined under WL, NBI and magnification.       No raised or otherwise suspicious areas seen, though mucosal view       somewhat limited by fluid and debris.      One superficial esophageal ulcer with no bleeding and no stigmata of       recent bleeding was found at the gastroesophageal junction. The lesion       was 3 mm in largest dimension.      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 No       stigmata of bleeding.      A 1-2 cm hiatal hernia was present.      Portal hypertensive gastropathy was found in the entire examined stomach.      Multiple diminutive erosions with no bleeding and no stigmata of recent       bleeding were found in the prepyloric region of the stomach. Several       biopsies were obtained on the greater  curvature of the gastric body, on       the lesser curvature of the gastric body, on the greater curvature of       the gastric antrum and on the lesser curvature of the gastric antrum       with cold forceps for histology.      Multiple non-bleeding duodenal ulcers with no stigmata of bleeding were       found in the duodenal bulb.      The exam of the  duodenum was otherwise normal. Impression:               - Esophageal mucosal changes suspicious for                            long-segment Barrett's esophagus. Biopsied. (two Bx                            taken b/c done in setting of upper GI bleeding -                            more extensive Bx will be needed at a follow up EGD                            to look for dysplasia)                           - Esophageal ulcer with no bleeding and no stigmata                            of recent bleeding.                           - Portal hypertensive gastropathy.                           - Erosive gastropathy with no bleeding and no                            stigmata of recent bleeding.                           - Multiple non-bleeding duodenal ulcers with no                            stigmata of bleeding.                           - Several biopsies were obtained on the greater                            curvature of the gastric body, on the lesser                            curvature of the gastric body, on the greater  curvature of the gastric antrum and on the lesser                            curvature of the gastric antrum. Recommendation:           - Return patient to hospital ward for ongoing care.                           - Clear liquid diet.                           - Continue protonix drip until current infusion                            complete, then change to 40 mg IV BID or oral if                            patient able to tolerate PO.                           Follow up pathology                           Discontinue alcohol use.                           - Watch closely for aocohol withdrawal. Procedure Code(s):        --- Professional ---                           [phone], Esophagogastroduodenoscopy, flexible,                            transoral; with biopsy, single or multiple Diagnosis Code(s):        --- Professional ---                            K22.8, Other specified diseases of esophagus                           K22.10, Ulcer of esophagus without bleeding                           K76.6, Portal hypertension                           K31.89, Other diseases of stomach and duodenum                           K26.9, Duodenal ulcer, unspecified as acute or                            chronic, without hemorrhage or perforation                           D62, Acute posthemorrhagic anemia  K92.0, Hematemesis                           K92.1, Melena (includes Hematochezia) CPT copyright 2019 [employer]. All rights reserved. The codes documented in this report are preliminary and upon coder review may  be revised to meet current compliance requirements. Mikiala Fugett L. Loletha Carrow, MD 06/19/2022 12:42:01 PM This report has been signed electronically. Number of Addenda: 0

## 2022-06-19 NOTE — Interval H&P Note (Signed)
History and Physical Interval Note:  06/19/2022 11:56 AM  Shane Holmes  has presented today for surgery, with the diagnosis of Hematemesis, melena, abdominal pain, alcoholic fatty liver disease.  The various methods of treatment have been discussed with the patient and family. After consideration of risks, benefits and other options for treatment, the patient has consented to  Procedure(s): ESOPHAGOGASTRODUODENOSCOPY (EGD) WITH PROPOFOL (N/A) as a surgical intervention.  [name] history has been reviewed, patient examined, no change in status, stable for surgery.  I have reviewed the patient's chart and labs.  Questions were answered to the patient's satisfaction.     Charlie Pitter III

## 2022-06-19 NOTE — ED Notes (Signed)
Pt's wife reports pt heavy drinker - last drink 8:30pm last night

## 2022-06-19 NOTE — Anesthesia Preprocedure Evaluation (Addendum)
Anesthesia Evaluation  Patient identified by MRN, date of birth, ID band Patient awake    Reviewed: Allergy & Precautions, NPO status , Patient's Chart, lab work & pertinent test results  History of Anesthesia Complications Negative for: history of anesthetic complications  Airway Mallampati: II  TM Distance: >3 FB Neck ROM: Full    Dental  (+) Poor Dentition   Pulmonary asthma , Current Smoker and Patient abstained from smoking.,    Pulmonary exam normal        Cardiovascular negative cardio ROS Normal cardiovascular exam     Neuro/Psych negative neurological ROS  negative psych ROS   GI/Hepatic negative GI ROS, (+)     substance abuse (last drink 2 days ago)  alcohol use,   Endo/Other  negative endocrine ROS  Renal/GU negative Renal ROS  negative genitourinary   Musculoskeletal negative musculoskeletal ROS (+)   Abdominal   Peds  Hematology  (+) Blood dyscrasia (Hgb 9.5, Plt 109k), anemia ,   Anesthesia Other Findings Day of surgery medications reviewed with patient.  Reproductive/Obstetrics negative OB ROS                            Anesthesia Physical Anesthesia Plan  ASA: 4  Anesthesia Plan: MAC   Post-op Pain Management: Minimal or no pain anticipated   Induction:   PONV Risk Score and Plan: Treatment may vary due to age or medical condition and Propofol infusion  Airway Management Planned: Natural Airway and Nasal Cannula  Additional Equipment: None  Intra-op Plan:   Post-operative Plan:   Informed Consent: I have reviewed the patients History and Physical, chart, labs and discussed the procedure including the risks, benefits and alternatives for the proposed anesthesia with the patient or authorized representative who has indicated his/her understanding and acceptance.       Plan Discussed with: CRNA  Anesthesia Plan Comments:        Anesthesia Quick  Evaluation

## 2022-06-19 NOTE — Anesthesia Postprocedure Evaluation (Signed)
Anesthesia Post Note  Patient: Shane Holmes  Procedure(s) Performed: ESOPHAGOGASTRODUODENOSCOPY (EGD) WITH PROPOFOL BIOPSY     Patient location during evaluation: PACU Anesthesia Type: MAC Level of consciousness: awake and alert Pain management: pain level controlled Vital Signs Assessment: post-procedure vital signs reviewed and stable Respiratory status: spontaneous breathing, nonlabored ventilation and respiratory function stable Cardiovascular status: blood pressure returned to baseline Postop Assessment: no apparent nausea or vomiting Anesthetic complications: no   No notable events documented.  Last Vitals:  Vitals:   06/19/22 1315 06/19/22 1330  BP: (!) 127/92 (!) 131/95  Pulse: 87 96  Resp: 18 18  Temp:  36.8 C  SpO2: 97% 98%    Last Pain:  Vitals:   06/19/22 1330  TempSrc:   PainSc: [place]

## 2022-06-19 NOTE — H&P (View-Only) (Signed)
[facility] Consult: 8:08 AM 06/19/2022  LOS: 0 days    Referring Provider: Dr Verlon Au  Primary Care Physician:  Pcp, No Primary Gastroenterologist: Althia Forts     Reason for Consultation: Upper GI bleed with hematemesis, black stools.  Normocytic anemia.   HPI: Shane Holmes is a 36 y.o. male.  PMH alcohol use disorder.  Left varicocele.  Severe fatty liver by CT December 2020. Folate deficiency.  DM2.  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  Bloody diarrhea, nonbloody nausea and vomiting, Hb 10.6, INR, platelets normal, T. bili 2, alk phos 62, AST/ALT 50/25 count during admission late February 2022.  He was COVID-positive.  Dr. Bryan Lemma did not pursue additional GI imaging or endoscopy/colonoscopy as patient clinically improved.  Planned outpatient colonoscopy.  Has not had outpatient GI follow-up Smooth muscle antibody negative, mitochondrial antibody negative, HCV nonreactive, HBV surface Ab reactive, HBV surface Ag nonreactive, HBV core Ab reactive, Ceruloplasmin normal, IgG normal, ANA negative.   Multiple visits to the ER this year.  Current encounter is fourth within less than 1 month.  Complaints include leg pain/spasms.  Previous gabapentin abandoned due to excessive sedation. At visits LFTs and ETOH have been elevated.  Continues to consume alcohol.    Currently presented with midline upper abdominal pain progressing over 3 days, maroon emesis, black stools.  Estimates he has had 4 or 5 episodes of dark bloody/maroon emesis, bloody from the start.  Yesterday was the worst, last episode was around 6 AM today.  Pain does not improve after emesis, it is not worsened by food but he has not been eating much.  Had been using 600 mg ibuprofen  every other day for the last couple of weeks but stopped using this on Sunday, 4 days ago.  Had similar pain but no n/v for 2 d last week, resolved spontaneously.  He has baseline is good appetite and no frequent GI symptoms.  Tacky to 120.  Mostly hypertensive blood pressure readings as high as 170/110 on arrival.  No fever.  Oxygen sats in mid to upper 90 percentile FOBT positive Hgb 10.6 ..9.7.  This compares with a range of 11.7-10.7 between late June and early July ( 2 weeks ago).  MCV 95.  Platelets 109, were as low as 99 three weeks ago.  INR 1.2 Ethanol less than 10 A1c 7.4 T. bili 2.1, alk phos 134, AST/ALT 97/21.  Low magnesium, normal phosphorus  CTAP w contrast: Hepatomegaly, fatty liver, splenomegaly, aortic atherosclerosis.  Pancreas, spleen, biliary tree, stomach and bowel unremarkable.  No ascites.  Both parents alcoholics with cirrhosis, both died in their 35s due to liver disease. 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  Lives with his fiance.  She does not drink. EtOH intake of 6 to 8 16 ounce mikes hard lemonades, sometimes with additional shots of liquor on a daily basis.  In the last week he has cut  back, consumed 2.5 bottles of hard lemonade yesterday.  Past Medical History:  Diagnosis Date   Alcohol abuse    Asthma    Back pain    Bulging lumbar disc    COVID    01/2021   Encounter for lumbar puncture    Hematochezia    Hypokalemia    Hyponatremia    Lower GI bleed    Neuropathy     History reviewed. No pertinent surgical history.  Prior to Admission medications   The only medication he is currently taking is alpha lipoid acid.  He is not taking any of the below medications.  Medication Sig Start Date End Date Taking? Authorizing Provider  albuterol (VENTOLIN HFA) 108 (90 Base) MCG/ACT inhaler INHALE 2 PUFFS INTO THE LUNGS EVERY SIX HOURS AS NEEDED FOR WHEEZING OR SHORTNESS OF BREATH. 01/26/21  01/26/22  Ghimire, Henreitta Leber, MD  folic acid (FOLVITE) 1 MG tablet Take 1 tablet (1 mg total) by mouth daily. Patient not taking: Reported on 01/10/2022 01/26/21   Jonetta Osgood, MD  gabapentin (NEURONTIN) 400 MG capsule Take 1 capsule (400 mg total) by mouth 3 (three) times daily. 01/26/21   Ghimire, Henreitta Leber, MD  ibuprofen (ADVIL) 200 MG tablet Take 400-600 mg by mouth every 6 (six) hours as needed for fever, headache or mild pain.    [provider]  metFORMIN (GLUCOPHAGE) 500 MG tablet Take 1 tablet (500 mg total) by mouth 2 (two) times daily with a meal. 05/23/22 06/22/22  Truddie Hidden, MD  methocarbamol (ROBAXIN) 500 MG tablet Take 1 tablet (500 mg total) by mouth every 8 (eight) hours as needed for muscle spasms. 06/07/22   Orpah Greek, MD  Multiple Vitamin (MULTIVITAMIN WITH MINERALS) TABS tablet Take 1 tablet by mouth daily. 01/27/21   Ghimire, Henreitta Leber, MD  potassium chloride SA (KLOR-CON M) 20 MEQ tablet Take 1 tablet (20 mEq total) by mouth daily. 06/07/22   Orpah Greek, MD  thiamine 100 MG tablet Take 1 tablet (100 mg total) by mouth daily. Patient not taking: Reported on 01/10/2022 01/27/21   Jonetta Osgood, MD  vitamin B-12 (CYANOCOBALAMIN) 1000 MCG tablet Take 1 tablet (1,000 mcg total) by mouth daily. Patient not taking: Reported on 01/10/2022 01/26/21   Jonetta Osgood, MD  dicyclomine (BENTYL) 20 MG tablet Take 1 tablet (20 mg total) by mouth 2 (two) times daily. Patient not taking: Reported on 11/02/2019 03/02/14 11/03/19  Margarita Mail, PA-C    Scheduled Meds:  fentaNYL (SUBLIMAZE) injection  50 mcg Intravenous Once   folic acid  1 mg Oral Daily   insulin aspart  0-6 Units Subcutaneous Q6H   multivitamin with minerals  1 tablet Oral Daily   nicotine  21 mg Transdermal Once   [START ON 06/22/2022] pantoprazole  40 mg Intravenous Q12H   thiamine  100 mg Oral Daily   Or   thiamine  100 mg Intravenous Daily   Infusions:  lactated ringers      pantoprazole 8 mg/hr (06/19/22 0033)   PRN Meds: acetaminophen **OR** acetaminophen, fentaNYL (SUBLIMAZE) injection, LORazepam **OR** LORazepam, naLOXone (NARCAN)  injection, ondansetron (ZOFRAN) IV   Allergies as of 06/18/2022 - Review Complete 06/07/2022  Allergen Reaction Noted   Lidocaine-glycerin Other (See Comments) 02/01/2021    Family History  Problem Relation Age of Onset   Cirrhosis Mother    Cirrhosis Father    Diabetes Neg Hx    Hypertension Neg Hx     Social History  Socioeconomic History   Marital status: Legally Separated    Spouse name: Not on file   Number of children: 2   Years of education: Not on file   Highest education level: Not on file  Occupational History   Not on file  Tobacco Use   Smoking status: Some Days    Types: Cigarettes   Smokeless tobacco: Never  Vaping Use   Vaping Use: Never used  Substance and Sexual Activity   Alcohol use: Yes    Alcohol/week: 6.0 standard drinks of alcohol    Types: 6 Cans of beer per week   Drug use: Yes    Types: Cocaine    Comment: cocaine positive 01/2022   Sexual activity: Yes  Other Topics Concern   Not on file  Social History Narrative   Not on file   Social Determinants of Health   Financial Resource Strain: Low Risk  (02/01/2021)   Overall Financial Resource Strain (CARDIA)    Difficulty of Paying Living Expenses: Not hard at all  Food Insecurity: No Food Insecurity (02/01/2021)   Hunger Vital Sign    Worried About Running Out of Food in the Last Year: Never true    Ran Out of Food in the Last Year: Never true  Transportation Needs: No Transportation Needs (02/01/2021)   PRAPARE - Hydrologist (Medical): No    Lack of Transportation (Non-Medical): No  Physical Activity: Not on file  Stress: Not on file  Social Connections: Not on file  Intimate Partner Violence: Not At Risk (02/01/2021)   Humiliation, Afraid, Rape, and Kick questionnaire    Fear of Current or  Ex-Partner: No    Emotionally Abused: No    Physically Abused: No    Sexually Abused: No    REVIEW OF SYSTEMS: Constitutional: No profound fatigue, feels weak with some malaise.  Thinks he is probably gained 10 or 15 pounds over the last several months. ENT:  No nose bleeds Pulm: Denies shortness of breath or productive cough. CV:  No palpitations, no LE edema.  No angina. GU:  No hematuria, no frequency.  Urine occasionally deeper yellow but no amber-colored or bloody urine GI: See HPI. Heme: Other than the hematemesis and tarry stools, no other unusual bleeding or bruising. Transfusions: None. Neuro: Burning electrical type pain in his legs. Derm:  No itching, no rash or sores.  Endocrine:  No sweats or chills.  No polyuria or dysuria Immunization: Reviewed. Travel:  Not queried.   PHYSICAL EXAM: Vital signs in last 24 hours: Vitals:   06/19/22 0345 06/19/22 0400  BP: 131/84 (!) 127/95  Pulse: (!) 110 (!) 107  Resp: 16 15  Temp:    SpO2: 91% 94%   Wt Readings from Last 3 Encounters:  06/05/22 86.6 kg  05/22/22 86.6 kg  01/12/22 86.6 kg    General: Patient looks moderately ill but is alert, resting comfortably on bed. Head: No facial asymmetry or swelling.  No signs of head trauma. Eyes: No conjunctival pallor or scleral icterus. Ears: Not hard of hearing Nose: No congestion or discharge Mouth: Mucosa is moist, pink, clear.  Tongue midline. Neck: No JVD, no masses, no thyromegaly Lungs: No labored breathing or cough.  Lungs clear bilaterally Heart: Regular with mild tachycardia. Abdomen: Soft.  Nontender hepatomegaly.  Tenderness in the upper abdomen/epigastric area without guarding or rebound.  No fluid wave or obvious ascites though abdomen is slightly protuberant. Rectal: Deferred Musc/Skeltl: No joint redness, swelling or  gross deformity. Extremities: Feet are tender to palpation.  No CCE. Neurologic: Oriented x3.  Good historian.  No tremors, no asterixis.   Moves all 4 limbs without gross weakness, strength not tested. Skin: No telangiectasia, rash, sores, suspicious lesions. Nodes: No CCE. Psych: Oriented x3.  Cooperative.  Calm  Intake/Output from previous day: 07/18 0701 - 07/19 0700 In: 1100 [IV Piggyback:1100] Out: -  Intake/Output this shift: No intake/output data recorded.  LAB RESULTS: Recent Labs    06/18/22 2149 06/19/22 0604  WBC 10.6* 7.5  HGB 10.6* 9.7*  HCT 32.9* 28.9*  PLT 133* 109*   BMET Lab Results  Component Value Date   NA 136 06/19/2022   NA 136 06/18/2022   NA 136 06/07/2022   K 3.5 06/19/2022   K 3.9 06/18/2022   K 3.1 (L) 06/07/2022   CL 101 06/19/2022   CL 98 06/18/2022   CL 95 (L) 06/07/2022   CO2 27 06/19/2022   CO2 24 06/18/2022   CO2 28 06/07/2022   GLUCOSE 177 (H) 06/19/2022   GLUCOSE 313 (H) 06/18/2022   GLUCOSE 300 (H) 06/07/2022   BUN 10 06/19/2022   BUN 9 06/18/2022   BUN 5 (L) 06/07/2022   CREATININE 0.48 (L) 06/19/2022   CREATININE 0.61 06/18/2022   CREATININE 0.54 (L) 06/07/2022   CALCIUM 7.8 (L) 06/19/2022   CALCIUM 8.5 (L) 06/18/2022   CALCIUM 9.0 06/07/2022   LFT Recent Labs    06/18/22 2149 06/19/22 0604  PROT 7.3 7.0  ALBUMIN 3.2* 2.9*  AST 96* 97*  ALT 21 21  ALKPHOS 160* 134*  BILITOT 1.8* 2.1*   PT/INR Lab Results  Component Value Date   INR 1.2 06/19/2022   INR 1.2 06/18/2022   INR 1.1 01/09/2022   Hepatitis Panel No results for input(s): "HEPBSAG", "HCVAB", "HEPAIGM", "HEPBIGM" in the last 72 hours. C-Diff No components found for: "CDIFF" Lipase     Component Value Date/Time   LIPASE 64 (H) 06/18/2022 2149    Drugs of Abuse     Component Value Date/Time   LABOPIA NONE DETECTED 06/07/2022 0401   COCAINSCRNUR NONE DETECTED 06/07/2022 0401   LABBENZ NONE DETECTED 06/07/2022 0401   AMPHETMU NONE DETECTED 06/07/2022 0401   THCU NONE DETECTED 06/07/2022 0401   LABBARB NONE DETECTED 06/07/2022 0401     RADIOLOGY STUDIES: CT ABDOMEN PELVIS W  CONTRAST  Result Date: 06/19/2022 CLINICAL DATA:  Abdominal pain. EXAM: CT ABDOMEN AND PELVIS WITH CONTRAST TECHNIQUE: Multidetector CT imaging of the abdomen and pelvis was performed using the standard protocol following bolus administration of intravenous contrast. RADIATION DOSE REDUCTION: This exam was performed according to the departmental dose-optimization program which includes automated exposure control, adjustment of the mA and/or kV according to patient size and/or use of iterative reconstruction technique. CONTRAST:  168m OMNIPAQUE IOHEXOL 300 MG/ML  SOLN COMPARISON:  CT abdomen pelvis dated 11/03/2019. FINDINGS: Lower chest: The visualized lung bases are clear. No intra-abdominal free air or free fluid. Hepatobiliary: Fatty liver. The liver is enlarged measuring 20 cm in midclavicular length. No biliary dilatation. The gallbladder is unremarkable. Pancreas: Unremarkable. No pancreatic ductal dilatation or surrounding inflammatory changes. Spleen: The spleen is enlarged measuring 16 cm in length. Adrenals/Urinary Tract: The adrenal glands unremarkable. Small right renal upper pole hypodense lesion similar to prior CT, poorly characterized on this CT. There is no hydronephrosis on either side. The visualized ureters and urinary bladder probable. Stomach/Bowel: There is no bowel obstruction or active inflammation. There is a small hiatal  hernia. The appendix is normal. Vascular/Lymphatic: Mild atherosclerotic calcification of the aorta. The IVC is unremarkable. No portal venous gas. There is no adenopathy. Reproductive: The prostate and seminal vesicles are grossly remarkable. No pelvic mass. Other: None Musculoskeletal: No acute or significant osseous findings. IMPRESSION: 1. No acute intra-abdominal or pelvic pathology. 2. Enlarged fatty liver may represent steatohepatitis. Clinical correlation is recommended. 3. Splenomegaly. 4. Aortic Atherosclerosis (ICD10-I70.0). Electronically Signed   By: Anner Crete M.D.   On: 06/19/2022 01:33      IMPRESSION:     UGIB, upper abdominal/epigastric pain in pt w ETOH fatty liver dz.  No prior EGD or colonoscopy.  Rule out portal hypertensive gastropathy, ulcer disease, varices, Mallory-Weiss tear.      Wilder anemia.  Hgb decline 2 grams over 1 month in setting of GIB.  Marland Kitchen      ETOH fatty liver.  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  Metabollic, viral, autoimmune testing 01/2021.     DM2.  Patient not on previously prescribed diabetes medications.   Hypertension.  No previously prescribed antihypertensive    Chronic ETOH abuse.     Splenomegaly and thrombocytopenia.  INR normal.       PLAN:       Needs EGD, timing TBD.  Keep n.p.o. for now.  Continue IV Protonix.   Azucena Freed  06/19/2022, 8:08 AM Phone [phone]  I have taken an interval history, thoroughly reviewed the chart and examined the patient. I agree with the Advanced Practitioner's note, impression and recommendations, and have recorded additional findings, impressions and recommendations below. I performed a substantive portion of this encounter (>50% time spent), including a complete performance of the medical decision making.  My additional thoughts are as follows:  36 year old man with longstanding alcohol abuse here with hematemesis beginning yesterday.  He also says it occurred to a lesser degree last week.  He is describing constant epigastric pain, and has been using NSAIDs regularly.  Low platelets might be suggestive of portal hypertension in the setting of mild splenomegaly, might also be consumptive.  INR 1.2.  Hypertensive and tachycardic.  He is alert and conversational, albeit acutely ill appearing. Abdomen soft with normal bowel sounds, mild epigastric tenderness, no rebound or guarding, and difficult to assess for organomegaly due to body habitus.  Suspect peptic ulcer, Mallory-Weiss, variceal source  Plan is for upper endoscopy in the next few hours  after he receives a 5 mg dose of IV Reglan (which I ordered and discussed with the ED nurse). He was agreeable to an upper endoscopy with control of bleeding after thorough discussion of the procedure and risks.  The benefits and risks of the planned procedure were described in detail with the patient or (when appropriate) their health care proxy.  Risks were outlined as including, but not limited to, bleeding, infection, perforation, adverse medication reaction leading to cardiac or pulmonary decompensation, pancreatitis (if ERCP).  The limitation of incomplete mucosal visualization was also discussed.  No guarantees or warranties were given.  He has good IV access and is on a Protonix drip.  After attending to this GI bleed and other events that may arise during the hospitalization, perhaps he would be agreeable to inpatient alcohol rehab.  Nelida Meuse III Office:[phone]

## 2022-06-19 NOTE — Consult Note (Addendum)
[facility] Consult: 8:08 AM 06/19/2022  LOS: 0 days    Referring Provider: Dr Verlon Au  Primary Care Physician:  Pcp, No Primary Gastroenterologist: Althia Forts     Reason for Consultation: Upper GI bleed with hematemesis, black stools.  Normocytic anemia.   HPI: Shane Holmes is a 36 y.o. male.  PMH alcohol use disorder.  Left varicocele.  Severe fatty liver by CT December 2020. Folate deficiency.  DM2.  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  Bloody diarrhea, nonbloody nausea and vomiting, Hb 10.6, INR, platelets normal, T. bili 2, alk phos 62, AST/ALT 50/25 count during admission late February 2022.  He was COVID-positive.  Dr. Bryan Lemma did not pursue additional GI imaging or endoscopy/colonoscopy as patient clinically improved.  Planned outpatient colonoscopy.  Has not had outpatient GI follow-up Smooth muscle antibody negative, mitochondrial antibody negative, HCV nonreactive, HBV surface Ab reactive, HBV surface Ag nonreactive, HBV core Ab reactive, Ceruloplasmin normal, IgG normal, ANA negative.   Multiple visits to the ER this year.  Current encounter is fourth within less than 1 month.  Complaints include leg pain/spasms.  Previous gabapentin abandoned due to excessive sedation. At visits LFTs and ETOH have been elevated.  Continues to consume alcohol.    Currently presented with midline upper abdominal pain progressing over 3 days, maroon emesis, black stools.  Estimates he has had 4 or 5 episodes of dark bloody/maroon emesis, bloody from the start.  Yesterday was the worst, last episode was around 6 AM today.  Pain does not improve after emesis, it is not worsened by food but he has not been eating much.  Had been using 600 mg ibuprofen  every other day for the last couple of weeks but stopped using this on Sunday, 4 days ago.  Had similar pain but no n/v for 2 d last week, resolved spontaneously.  He has baseline is good appetite and no frequent GI symptoms.  Tacky to 120.  Mostly hypertensive blood pressure readings as high as 170/110 on arrival.  No fever.  Oxygen sats in mid to upper 90 percentile FOBT positive Hgb 10.6 ..9.7.  This compares with a range of 11.7-10.7 between late June and early July ( 2 weeks ago).  MCV 95.  Platelets 109, were as low as 99 three weeks ago.  INR 1.2 Ethanol less than 10 A1c 7.4 T. bili 2.1, alk phos 134, AST/ALT 97/21.  Low magnesium, normal phosphorus  CTAP w contrast: Hepatomegaly, fatty liver, splenomegaly, aortic atherosclerosis.  Pancreas, spleen, biliary tree, stomach and bowel unremarkable.  No ascites.  Both parents alcoholics with cirrhosis, both died in their 35s due to liver disease. 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  Lives with his fiance.  She does not drink. EtOH intake of 6 to 8 16 ounce mikes hard lemonades, sometimes with additional shots of liquor on a daily basis.  In the last week he has cut  back, consumed 2.5 bottles of hard lemonade yesterday.  Past Medical History:  Diagnosis Date   Alcohol abuse    Asthma    Back pain    Bulging lumbar disc    COVID    01/2021   Encounter for lumbar puncture    Hematochezia    Hypokalemia    Hyponatremia    Lower GI bleed    Neuropathy     History reviewed. No pertinent surgical history.  Prior to Admission medications   The only medication he is currently taking is alpha lipoid acid.  He is not taking any of the below medications.  Medication Sig Start Date End Date Taking? Authorizing Provider  albuterol (VENTOLIN HFA) 108 (90 Base) MCG/ACT inhaler INHALE 2 PUFFS INTO THE LUNGS EVERY SIX HOURS AS NEEDED FOR WHEEZING OR SHORTNESS OF BREATH. 01/26/21  01/26/22  Ghimire, Henreitta Leber, MD  folic acid (FOLVITE) 1 MG tablet Take 1 tablet (1 mg total) by mouth daily. Patient not taking: Reported on 01/10/2022 01/26/21   Jonetta Osgood, MD  gabapentin (NEURONTIN) 400 MG capsule Take 1 capsule (400 mg total) by mouth 3 (three) times daily. 01/26/21   Ghimire, Henreitta Leber, MD  ibuprofen (ADVIL) 200 MG tablet Take 400-600 mg by mouth every 6 (six) hours as needed for fever, headache or mild pain.    [provider]  metFORMIN (GLUCOPHAGE) 500 MG tablet Take 1 tablet (500 mg total) by mouth 2 (two) times daily with a meal. 05/23/22 06/22/22  Truddie Hidden, MD  methocarbamol (ROBAXIN) 500 MG tablet Take 1 tablet (500 mg total) by mouth every 8 (eight) hours as needed for muscle spasms. 06/07/22   Orpah Greek, MD  Multiple Vitamin (MULTIVITAMIN WITH MINERALS) TABS tablet Take 1 tablet by mouth daily. 01/27/21   Ghimire, Henreitta Leber, MD  potassium chloride SA (KLOR-CON M) 20 MEQ tablet Take 1 tablet (20 mEq total) by mouth daily. 06/07/22   Orpah Greek, MD  thiamine 100 MG tablet Take 1 tablet (100 mg total) by mouth daily. Patient not taking: Reported on 01/10/2022 01/27/21   Jonetta Osgood, MD  vitamin B-12 (CYANOCOBALAMIN) 1000 MCG tablet Take 1 tablet (1,000 mcg total) by mouth daily. Patient not taking: Reported on 01/10/2022 01/26/21   Jonetta Osgood, MD  dicyclomine (BENTYL) 20 MG tablet Take 1 tablet (20 mg total) by mouth 2 (two) times daily. Patient not taking: Reported on 11/02/2019 03/02/14 11/03/19  Margarita Mail, PA-C    Scheduled Meds:  fentaNYL (SUBLIMAZE) injection  50 mcg Intravenous Once   folic acid  1 mg Oral Daily   insulin aspart  0-6 Units Subcutaneous Q6H   multivitamin with minerals  1 tablet Oral Daily   nicotine  21 mg Transdermal Once   [START ON 06/22/2022] pantoprazole  40 mg Intravenous Q12H   thiamine  100 mg Oral Daily   Or   thiamine  100 mg Intravenous Daily   Infusions:  lactated ringers      pantoprazole 8 mg/hr (06/19/22 0033)   PRN Meds: acetaminophen **OR** acetaminophen, fentaNYL (SUBLIMAZE) injection, LORazepam **OR** LORazepam, naLOXone (NARCAN)  injection, ondansetron (ZOFRAN) IV   Allergies as of 06/18/2022 - Review Complete 06/07/2022  Allergen Reaction Noted   Lidocaine-glycerin Other (See Comments) 02/01/2021    Family History  Problem Relation Age of Onset   Cirrhosis Mother    Cirrhosis Father    Diabetes Neg Hx    Hypertension Neg Hx     Social History  Socioeconomic History   Marital status: Legally Separated    Spouse name: Not on file   Number of children: 2   Years of education: Not on file   Highest education level: Not on file  Occupational History   Not on file  Tobacco Use   Smoking status: Some Days    Types: Cigarettes   Smokeless tobacco: Never  Vaping Use   Vaping Use: Never used  Substance and Sexual Activity   Alcohol use: Yes    Alcohol/week: 6.0 standard drinks of alcohol    Types: 6 Cans of beer per week   Drug use: Yes    Types: Cocaine    Comment: cocaine positive 01/2022   Sexual activity: Yes  Other Topics Concern   Not on file  Social History Narrative   Not on file   Social Determinants of Health   Financial Resource Strain: Low Risk  (02/01/2021)   Overall Financial Resource Strain (CARDIA)    Difficulty of Paying Living Expenses: Not hard at all  Food Insecurity: No Food Insecurity (02/01/2021)   Hunger Vital Sign    Worried About Running Out of Food in the Last Year: Never true    Ran Out of Food in the Last Year: Never true  Transportation Needs: No Transportation Needs (02/01/2021)   PRAPARE - Hydrologist (Medical): No    Lack of Transportation (Non-Medical): No  Physical Activity: Not on file  Stress: Not on file  Social Connections: Not on file  Intimate Partner Violence: Not At Risk (02/01/2021)   Humiliation, Afraid, Rape, and Kick questionnaire    Fear of Current or  Ex-Partner: No    Emotionally Abused: No    Physically Abused: No    Sexually Abused: No    REVIEW OF SYSTEMS: Constitutional: No profound fatigue, feels weak with some malaise.  Thinks he is probably gained 10 or 15 pounds over the last several months. ENT:  No nose bleeds Pulm: Denies shortness of breath or productive cough. CV:  No palpitations, no LE edema.  No angina. GU:  No hematuria, no frequency.  Urine occasionally deeper yellow but no amber-colored or bloody urine GI: See HPI. Heme: Other than the hematemesis and tarry stools, no other unusual bleeding or bruising. Transfusions: None. Neuro: Burning electrical type pain in his legs. Derm:  No itching, no rash or sores.  Endocrine:  No sweats or chills.  No polyuria or dysuria Immunization: Reviewed. Travel:  Not queried.   PHYSICAL EXAM: Vital signs in last 24 hours: Vitals:   06/19/22 0345 06/19/22 0400  BP: 131/84 (!) 127/95  Pulse: (!) 110 (!) 107  Resp: 16 15  Temp:    SpO2: 91% 94%   Wt Readings from Last 3 Encounters:  06/05/22 86.6 kg  05/22/22 86.6 kg  01/12/22 86.6 kg    General: Patient looks moderately ill but is alert, resting comfortably on bed. Head: No facial asymmetry or swelling.  No signs of head trauma. Eyes: No conjunctival pallor or scleral icterus. Ears: Not hard of hearing Nose: No congestion or discharge Mouth: Mucosa is moist, pink, clear.  Tongue midline. Neck: No JVD, no masses, no thyromegaly Lungs: No labored breathing or cough.  Lungs clear bilaterally Heart: Regular with mild tachycardia. Abdomen: Soft.  Nontender hepatomegaly.  Tenderness in the upper abdomen/epigastric area without guarding or rebound.  No fluid wave or obvious ascites though abdomen is slightly protuberant. Rectal: Deferred Musc/Skeltl: No joint redness, swelling or  gross deformity. Extremities: Feet are tender to palpation.  No CCE. Neurologic: Oriented x3.  Good historian.  No tremors, no asterixis.   Moves all 4 limbs without gross weakness, strength not tested. Skin: No telangiectasia, rash, sores, suspicious lesions. Nodes: No CCE. Psych: Oriented x3.  Cooperative.  Calm  Intake/Output from previous day: 07/18 0701 - 07/19 0700 In: 1100 [IV Piggyback:1100] Out: -  Intake/Output this shift: No intake/output data recorded.  LAB RESULTS: Recent Labs    06/18/22 2149 06/19/22 0604  WBC 10.6* 7.5  HGB 10.6* 9.7*  HCT 32.9* 28.9*  PLT 133* 109*   BMET Lab Results  Component Value Date   NA 136 06/19/2022   NA 136 06/18/2022   NA 136 06/07/2022   K 3.5 06/19/2022   K 3.9 06/18/2022   K 3.1 (L) 06/07/2022   CL 101 06/19/2022   CL 98 06/18/2022   CL 95 (L) 06/07/2022   CO2 27 06/19/2022   CO2 24 06/18/2022   CO2 28 06/07/2022   GLUCOSE 177 (H) 06/19/2022   GLUCOSE 313 (H) 06/18/2022   GLUCOSE 300 (H) 06/07/2022   BUN 10 06/19/2022   BUN 9 06/18/2022   BUN 5 (L) 06/07/2022   CREATININE 0.48 (L) 06/19/2022   CREATININE 0.61 06/18/2022   CREATININE 0.54 (L) 06/07/2022   CALCIUM 7.8 (L) 06/19/2022   CALCIUM 8.5 (L) 06/18/2022   CALCIUM 9.0 06/07/2022   LFT Recent Labs    06/18/22 2149 06/19/22 0604  PROT 7.3 7.0  ALBUMIN 3.2* 2.9*  AST 96* 97*  ALT 21 21  ALKPHOS 160* 134*  BILITOT 1.8* 2.1*   PT/INR Lab Results  Component Value Date   INR 1.2 06/19/2022   INR 1.2 06/18/2022   INR 1.1 01/09/2022   Hepatitis Panel No results for input(s): "HEPBSAG", "HCVAB", "HEPAIGM", "HEPBIGM" in the last 72 hours. C-Diff No components found for: "CDIFF" Lipase     Component Value Date/Time   LIPASE 64 (H) 06/18/2022 2149    Drugs of Abuse     Component Value Date/Time   LABOPIA NONE DETECTED 06/07/2022 0401   COCAINSCRNUR NONE DETECTED 06/07/2022 0401   LABBENZ NONE DETECTED 06/07/2022 0401   AMPHETMU NONE DETECTED 06/07/2022 0401   THCU NONE DETECTED 06/07/2022 0401   LABBARB NONE DETECTED 06/07/2022 0401     RADIOLOGY STUDIES: CT ABDOMEN PELVIS W  CONTRAST  Result Date: 06/19/2022 CLINICAL DATA:  Abdominal pain. EXAM: CT ABDOMEN AND PELVIS WITH CONTRAST TECHNIQUE: Multidetector CT imaging of the abdomen and pelvis was performed using the standard protocol following bolus administration of intravenous contrast. RADIATION DOSE REDUCTION: This exam was performed according to the departmental dose-optimization program which includes automated exposure control, adjustment of the mA and/or kV according to patient size and/or use of iterative reconstruction technique. CONTRAST:  100mL OMNIPAQUE IOHEXOL 300 MG/ML  SOLN COMPARISON:  CT abdomen pelvis dated 11/03/2019. FINDINGS: Lower chest: The visualized lung bases are clear. No intra-abdominal free air or free fluid. Hepatobiliary: Fatty liver. The liver is enlarged measuring 20 cm in midclavicular length. No biliary dilatation. The gallbladder is unremarkable. Pancreas: Unremarkable. No pancreatic ductal dilatation or surrounding inflammatory changes. Spleen: The spleen is enlarged measuring 16 cm in length. Adrenals/Urinary Tract: The adrenal glands unremarkable. Small right renal upper pole hypodense lesion similar to prior CT, poorly characterized on this CT. There is no hydronephrosis on either side. The visualized ureters and urinary bladder probable. Stomach/Bowel: There is no bowel obstruction or active inflammation. There is a small hiatal   hernia. The appendix is normal. Vascular/Lymphatic: Mild atherosclerotic calcification of the aorta. The IVC is unremarkable. No portal venous gas. There is no adenopathy. Reproductive: The prostate and seminal vesicles are grossly remarkable. No pelvic mass. Other: None Musculoskeletal: No acute or significant osseous findings. IMPRESSION: 1. No acute intra-abdominal or pelvic pathology. 2. Enlarged fatty liver may represent steatohepatitis. Clinical correlation is recommended. 3. Splenomegaly. 4. Aortic Atherosclerosis (ICD10-I70.0). Electronically Signed   By: Anner Crete M.D.   On: 06/19/2022 01:33      IMPRESSION:     UGIB, upper abdominal/epigastric pain in pt w ETOH fatty liver dz.  No prior EGD or colonoscopy.  Rule out portal hypertensive gastropathy, ulcer disease, varices, Mallory-Weiss tear.      Mora anemia.  Hgb decline 2 grams over 1 month in setting of GIB.  Marland Kitchen      ETOH fatty liver.  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  Metabollic, viral, autoimmune testing 01/2021.     DM2.  Patient not on previously prescribed diabetes medications.   Hypertension.  No previously prescribed antihypertensive    Chronic ETOH abuse.     Splenomegaly and thrombocytopenia.  INR normal.       PLAN:       Needs EGD, timing TBD.  Keep n.p.o. for now.  Continue IV Protonix.   Azucena Freed  06/19/2022, 8:08 AM Phone [phone]  I have taken an interval history, thoroughly reviewed the chart and examined the patient. I agree with the Advanced Practitioner's note, impression and recommendations, and have recorded additional findings, impressions and recommendations below. I performed a substantive portion of this encounter (>50% time spent), including a complete performance of the medical decision making.  My additional thoughts are as follows:  36 year old man with longstanding alcohol abuse here with hematemesis beginning yesterday.  He also says it occurred to a lesser degree last week.  He is describing constant epigastric pain, and has been using NSAIDs regularly.  Low platelets might be suggestive of portal hypertension in the setting of mild splenomegaly, might also be consumptive.  INR 1.2.  Hypertensive and tachycardic.  He is alert and conversational, albeit acutely ill appearing. Abdomen soft with normal bowel sounds, mild epigastric tenderness, no rebound or guarding, and difficult to assess for organomegaly due to body habitus.  Suspect peptic ulcer, Mallory-Weiss, variceal source  Plan is for upper endoscopy in the next few hours  after he receives a 5 mg dose of IV Reglan (which I ordered and discussed with the ED nurse). He was agreeable to an upper endoscopy with control of bleeding after thorough discussion of the procedure and risks.  The benefits and risks of the planned procedure were described in detail with the patient or (when appropriate) their health care proxy.  Risks were outlined as including, but not limited to, bleeding, infection, perforation, adverse medication reaction leading to cardiac or pulmonary decompensation, pancreatitis (if ERCP).  The limitation of incomplete mucosal visualization was also discussed.  No guarantees or warranties were given.  He has good IV access and is on a Protonix drip.  After attending to this GI bleed and other events that may arise during the hospitalization, perhaps he would be agreeable to inpatient alcohol rehab.  Nelida Meuse III Office:[phone]

## 2022-06-19 NOTE — Progress Notes (Signed)
Patient seen and examined and plan discussed with  36 year old white male, known chronic EtOH microcytic anemia, tobacco abuse, prior asthma admitted 2022 asymptomatic COVID asymptomatic at the time--and GI at the time recommended outpatient endoscopy--autoimmune work-up was performed at the time secondary to bilateral lower extremity weakness-LP  He was seen in the ED 7/56/21 for leg spasms and pain  Return to the ED 06/18/2022 chronic leg pain that was worse-found to have hematemesis as well as dark stools X 3 days Given pantoprazole gtt. given Ativan and oxycodone as well as Zofran CT abdomen pelvis no evidence of acute intra-abdominal or pelvic process but fatty liver rectal exam dark appearing stool fecal occult was positive Dr Barron Alvine GI was consulted  Hemoglobin 10.6-->9.7, platelet 133-if >1.60 AST/ALT 96/21 A1c 7.4   S Patient stopped drinking about 2 days ago He has no cp fever He had progressive worsening of vomiting blood over the past week-he has been taking ibuprofen for his neuropathy He has no cp fever His LE pain is moderate  O/e BP (!) 135/106   Pulse (!) 105   Temp 98.3 F (36.8 C) (Oral)   Resp 15   SpO2 96%  Awake coherent comortable in and no focal deficit Cta b no added sound no rales rhonchi Abd obese--Liver palpable Spleen palpable No asterixis   P Probably needs scope?--await GI--Hemoglobin not significantly lower than usual but neve r has been scoped? Get b12, methyl malonic acid for neuropathy work-up Nees to stop drinking---add Lyrica--discussed this can be managed  with oral meds--stop fentanyl  Expect wil be here several days

## 2022-06-19 NOTE — H&P (Signed)
History and Physical    PLEASE NOTE THAT DRAGON DICTATION SOFTWARE WAS USED IN THE CONSTRUCTION OF THIS NOTE.   Shane Holmes:[id number] DOB: [birth year] DOA: 06/18/2022  PCP: Pcp, No (will further assess) Patient coming from: home   I have personally briefly reviewed patient's old medical records in [place]  Chief Complaint: Abdominal pain  HPI: Shane Holmes is a 36 y.o. male with medical history significant for chronic alcohol abuse, chronic anemia with baseline hemoglobin 10-12, prediabetes, who is admitted to [facility] on 06/18/2022 with acute upper gastrointestinal bleed after presenting from home to [facility] ED complaining of abdominal pain.   The patient reports 3 days of sharp, nonradiating, epigastric discomfort that has been constant over that time, intensifying with palpation over the abdomen as well as shortly after attempting to eat or drink anything over that timeframe.  Denies any preceding trauma.  He notes that this pain has been associated with intermittent nausea, resulting in 3-4 episodes of hematemesis over the last 3 days, with most recent such episode occurring on the afternoon of 06/18/2022.  He acknowledges no additional vomiting since he has presented to the emergency department this evening.  Vomitus has not been associate with any coffee-ground appearance.  After the first episode of hematemesis few days ago, the patient also notes that he has experienced 2 episodes of dark stool in the absence of any diarrhea.  Denies any hematochezia.   No associated any chest pain, shortness breath, palpitations, diaphoresis, dizziness, presyncope, or syncope.  Also denies any recent subjective fever, chills, rigors, or generalized myalgias.  Abdominal pain is not associate with any dysuria or gross hematuria.  No recent cough, hemoptysis.  He conveys a history of acute lower gastrointestinal bleed approximately a year ago.  He acknowledges a history of chronic  alcohol abuse, reporting daily consumption of at least 612 ounce beers, with most recent alcohol consumption occurring on 06/18/2022.  Denies any known history of underlying chronic liver disease, nor any known history of varices or history of esophageal varices bleed.  Denies use of blood thinners as an outpatient, including no aspirin.  No regular or recent use of NSAIDs.  Per chart review, the patient also has a history of chronic anemia with baseline hemoglobin range noted to be 10-12, with most recent prior hemoglobin data point noted to be 10.7 on 06/07/2022.      ED Course:  Vital signs in the ED were notable for the following: Afebrile; initial heart rate 109, which decreased to 97 following interval IV fluids, as further detailed above.  BP 116/75 - 130/88; respiratory rate 15-18, oxygen saturation 97 to 99% on room air.  Labs were notable for the following: CMP notable for the following: Bicarbonate 24 [place] gap 14, BUN 9 compared to most recent prior value of 5 on 06/06/2022, creatinine 0.61 compared to 0.54 on 06/06/2022, glucose 313, calcium, adjusted for mild hypokalemia noted to be approximately 9.1, albumin 8.5.  In comparison to most recent prior set of liver enzymes, which were drawn on 06/06/2022, this evening's liver enzymes were notable for the following: Alkaline phosphatase of 160 compared to 171 on 06/06/2022, AST 96 compared to 107, ALT 21 compared to 25, total bilirubin 1.8 compared to 2.5.  Lipase 64.  CBC notable for the following: Will with cell count 10,600.  7600 on 06/06/2022, hemoglobin 10.6 associated normocytic/normochromic findings as well as nonelevated RDW, platelet count 1338.  INR 1.2.  Urinalysis showed no white blood  cells, no red blood cells, and was sissy with specific gravity of 1.046.  EDP performed DRE revealing dark appearing stool that was fecal occult blood positive.  Type and screen initiated in the ED this evening.  Imaging and additional notable ED work-up: EKG  shows sinus tachycardia with heart rate 102, normal intervals, nonspecific T wave inversion in lead III, and no evidence of ST changes, including no evidence of ST elevation.  CT abdomen/pelvis with contrast showed no evidence of acute intra-abdominal or acute intrapelvic process while showing evidence of enlarged fatty liver.   EDP contacted on-call [facility], Dr. Bryan Lemma, with plan for formal consultation to occur in the morning.   While in the ED, the following were administered: Dilaudid 1 mg IV x1, Zofran 4 mg IV x1, prn Percocet x1 dose, lactated Ringer's x1 L bolus, Protonix 80 mg IV x1 followed by initiation of Protonix drip.  Subsequently, the patient was admitted for acute upper gastrointestinal bleed.      Review of Systems: As per HPI otherwise 10 point review of systems negative.   Past Medical History:  Diagnosis Date   Alcohol abuse    Asthma    Back pain    Bulging lumbar disc    COVID    01/2021   Encounter for lumbar puncture    Hematochezia    Hypokalemia    Hyponatremia    Lower GI bleed    Neuropathy     History reviewed. No pertinent surgical history.  Social History:  reports that he has been smoking cigarettes. He has never used smokeless tobacco. He reports current alcohol use of about 6.0 standard drinks of alcohol per week. He reports current drug use. Drug: Cocaine.   Allergies  Allergen Reactions   Lidocaine-Glycerin Other (See Comments)    Family History  Problem Relation Age of Onset   Cirrhosis Mother    Cirrhosis Father    Diabetes Neg Hx    Hypertension Neg Hx     Family history reviewed and not pertinent    Prior to Admission medications   Medication Sig Start Date End Date Taking? Authorizing Provider  albuterol (VENTOLIN HFA) 108 (90 Base) MCG/ACT inhaler INHALE 2 PUFFS INTO THE LUNGS EVERY SIX HOURS AS NEEDED FOR WHEEZING OR SHORTNESS OF BREATH. 01/26/21 01/26/22  Ghimire, Henreitta Leber, MD  folic acid (FOLVITE) 1 MG tablet Take 1  tablet (1 mg total) by mouth daily. Patient not taking: Reported on 01/10/2022 01/26/21   Jonetta Osgood, MD  gabapentin (NEURONTIN) 400 MG capsule Take 1 capsule (400 mg total) by mouth 3 (three) times daily. 01/26/21   Ghimire, Henreitta Leber, MD  ibuprofen (ADVIL) 200 MG tablet Take 400-600 mg by mouth every 6 (six) hours as needed for fever, headache or mild pain.    [provider]  metFORMIN (GLUCOPHAGE) 500 MG tablet Take 1 tablet (500 mg total) by mouth 2 (two) times daily with a meal. 05/23/22 06/22/22  Truddie Hidden, MD  methocarbamol (ROBAXIN) 500 MG tablet Take 1 tablet (500 mg total) by mouth every 8 (eight) hours as needed for muscle spasms. 06/07/22   Orpah Greek, MD  Multiple Vitamin (MULTIVITAMIN WITH MINERALS) TABS tablet Take 1 tablet by mouth daily. 01/27/21   Ghimire, Henreitta Leber, MD  potassium chloride SA (KLOR-CON M) 20 MEQ tablet Take 1 tablet (20 mEq total) by mouth daily. 06/07/22   Orpah Greek, MD  thiamine 100 MG tablet Take 1 tablet (100 mg total) by  mouth daily. Patient not taking: Reported on 01/10/2022 01/27/21   Jonetta Osgood, MD  vitamin B-12 (CYANOCOBALAMIN) 1000 MCG tablet Take 1 tablet (1,000 mcg total) by mouth daily. Patient not taking: Reported on 01/10/2022 01/26/21   Jonetta Osgood, MD  dicyclomine (BENTYL) 20 MG tablet Take 1 tablet (20 mg total) by mouth 2 (two) times daily. Patient not taking: Reported on 11/02/2019 03/02/14 11/03/19  Margarita Mail, PA-C     Objective    Physical Exam: Vitals:   06/19/22 0245 06/19/22 0300 06/19/22 0315 06/19/22 0330  BP: 112/82 123/90 137/82 (!) 128/94  Pulse: (!) 101 (!) 109 (!) 108 (!) 107  Resp: _0 Temp:      SpO2: 96% 95% 94% 93%    General: appears to be stated age; alert, oriented Skin: warm, dry, no rash Head:  AT/[place] Mouth:  Oral mucosa membranes appear dry, normal dentition Neck: supple; trachea midline Heart:  RRR; did not appreciate any M/R/G Lungs: CTAB, did  not appreciate any wheezes, rales, or rhonchi Abdomen: + BS; soft, ND, mild tenderness to palpation over the epigastrium, in the absence of any associated guarding, rigidity, or rebound tenderness. Vascular: 2+ pedal pulses b/l; 2+ radial pulses b/l Extremities: no peripheral edema, no muscle wasting Neuro: strength and sensation intact in upper and lower extremities b/l    Labs on Admission: I have personally reviewed following labs and imaging studies  CBC: Recent Labs  Lab 06/18/22 2149  WBC 10.6*  NEUTROABS 7.8*  HGB 10.6*  HCT 32.9*  MCV 97.6  PLT 798*   Basic Metabolic Panel: Recent Labs  Lab 06/18/22 2149  NA 136  K 3.9  CL 98  CO2 24  GLUCOSE 313*  BUN 9  CREATININE 0.61  CALCIUM 8.5*   GFR: Estimated Creatinine Clearance: 134.7 mL/min (by C-G formula based on SCr of 0.61 mg/dL). Liver Function Tests: Recent Labs  Lab 06/18/22 2149  AST 96*  ALT 21  ALKPHOS 160*  BILITOT 1.8*  PROT 7.3  ALBUMIN 3.2*   Recent Labs  Lab 06/18/22 2149  LIPASE 64*   No results for input(s): "AMMONIA" in the last 168 hours. Coagulation Profile: Recent Labs  Lab 06/18/22 2346  INR 1.2   Cardiac Enzymes: No results for input(s): "CKTOTAL", "CKMB", "CKMBINDEX", "TROPONINI" in the last 168 hours. BNP (last 3 results) No results for input(s): "PROBNP" in the last 8760 hours. HbA1C: No results for input(s): "HGBA1C" in the last 72 hours. CBG: No results for input(s): "GLUCAP" in the last 168 hours. Lipid Profile: No results for input(s): "CHOL", "HDL", "LDLCALC", "TRIG", "CHOLHDL", "LDLDIRECT" in the last 72 hours. Thyroid Function Tests: No results for input(s): "TSH", "T4TOTAL", "FREET4", "T3FREE", "THYROIDAB" in the last 72 hours. Anemia Panel: No results for input(s): "VITAMINB12", "FOLATE", "FERRITIN", "TIBC", "IRON", "RETICCTPCT" in the last 72 hours. Urine analysis:    Component Value Date/Time   COLORURINE AMBER (A) 06/19/2022 0230   APPEARANCEUR CLEAR  06/19/2022 0230   LABSPEC >1.046 (H) 06/19/2022 0230   PHURINE 5.0 06/19/2022 0230   GLUCOSEU 150 (A) 06/19/2022 0230   HGBUR NEGATIVE 06/19/2022 0230   BILIRUBINUR NEGATIVE 06/19/2022 0230   KETONESUR NEGATIVE 06/19/2022 0230   PROTEINUR 30 (A) 06/19/2022 0230   NITRITE NEGATIVE 06/19/2022 0230   LEUKOCYTESUR NEGATIVE 06/19/2022 0230    Radiological Exams on Admission: CT ABDOMEN PELVIS W CONTRAST  Result Date: 06/19/2022 CLINICAL DATA:  Abdominal pain. EXAM: CT ABDOMEN AND PELVIS WITH CONTRAST TECHNIQUE: Multidetector CT imaging  of the abdomen and pelvis was performed using the standard protocol following bolus administration of intravenous contrast. RADIATION DOSE REDUCTION: This exam was performed according to the departmental dose-optimization program which includes automated exposure control, adjustment of the mA and/or kV according to patient size and/or use of iterative reconstruction technique. CONTRAST:  193m OMNIPAQUE IOHEXOL 300 MG/ML  SOLN COMPARISON:  CT abdomen pelvis dated 11/03/2019. FINDINGS: Lower chest: The visualized lung bases are clear. No intra-abdominal free air or free fluid. Hepatobiliary: Fatty liver. The liver is enlarged measuring 20 cm in midclavicular length. No biliary dilatation. The gallbladder is unremarkable. Pancreas: Unremarkable. No pancreatic ductal dilatation or surrounding inflammatory changes. Spleen: The spleen is enlarged measuring 16 cm in length. Adrenals/Urinary Tract: The adrenal glands unremarkable. Small right renal upper pole hypodense lesion similar to prior CT, poorly characterized on this CT. There is no hydronephrosis on either side. The visualized ureters and urinary bladder probable. Stomach/Bowel: There is no bowel obstruction or active inflammation. There is a small hiatal hernia. The appendix is normal. Vascular/Lymphatic: Mild atherosclerotic calcification of the aorta. The IVC is unremarkable. No portal venous gas. There is no  adenopathy. Reproductive: The prostate and seminal vesicles are grossly remarkable. No pelvic mass. Other: None Musculoskeletal: No acute or significant osseous findings. IMPRESSION: 1. No acute intra-abdominal or pelvic pathology. 2. Enlarged fatty liver may represent steatohepatitis. Clinical correlation is recommended. 3. Splenomegaly. 4. Aortic Atherosclerosis (ICD10-I70.0). Electronically Signed   By: AAnner CreteM.D.   On: 06/19/2022 01:33     EKG: Independently reviewed, with result as described above.    Assessment/Plan   Principal Problem:   Acute upper GI bleed Active Problems:   Dehydration   Alcohol abuse   Epigastric pain   Hyperglycemia   Leukocytosis   Chronic anemia   Neuropathy     #) Acute upper gastrointestinal bleed: Presents with 3 days of intermittent hematemesis associated with new onset epigastric discomfort and ensuing appearance of melena, with DRE confirming dark appearance of stool and fecal occult blood positive finding.  Of note, most recent episode of hematemesis occurred on 06/18/2022, without any episodes of such since presenting to the emergency department this evening.   This is in the context of a history of chronic alcohol abuse although no associated known history of cirrhosis, no history of liver disease in general, nor any history of varices.  Not on any blood thinners and denies any NSAID use.  Given the patient's history of chronic alcohol abuse, differential at this time, particular in setting of the patient's.  Hemodynamic stability appears to favor influenza B suspected ulcer disease versus esophagitis, while variceal bleed appears less likely.  Consequently, we will refrain from initiation of octreotide drip at this time.In the absence of any known history of liver disease, we will refrain from initiation of SBP prophylaxis.  However, in the setting of suspected upper GI source for his bleed, will continue Protonix drip initiated in the ED  this evening.  Hemoglobin appears at baseline, although there may be an element of hemoconcentration causing a slightly false elevation in this value given additional clinical indicators of mild dehydration.  Mild sinus tachycardia improved following initiation of IV fluids.  Type and screen initiated in the ED.  [facility] is [facility], Dr. CBryan Lemma with plan for formal consultation in the morning.   Plan: NPO. Refraining from pharmacologic DVT prophylaxis. Monitor on telemetry. Monitor continuous pulse-ox. Maintain at least 2 large bore IV's. Check INR in . Q4H  H&H's have been ordered through 1300 AM on 06/19/2022. Will closely monitor these ensuing Hgb levels and correlate these data points with the patient's overall clinical picture including vital signs to determine need for transfusion. On-call GI consulted, as above, with additional recs pending at this time. CMP in the AM. Protonix drip.  Prn IV fentanyl.  Prn Zofran.       #(Epigastric pain: 3 days epigastric pain coincided with onset of acute upper gastrointestinal bleed, physical exam revealing no evidence of acute peritoneal signs, while CT abdomen/pelvis showed no evidence of acute intra-abdominal/intrapelvic process.  Differential at this time similar to that associate with his presenting acute upper question simply, including gastritis versus peptic ulcer disease.  Presentation appears less consistent with [place] or esophageal tear.  No evidence of acute gallbladder pathology on presenting imaging.  Plan: Prn IV fentanyl.  Further evaluation management of acute upper gastrointestinal bleed, including Protonix drip, as above.  Repeat CMP in the morning.       #) Hyperglycemia: In the context of a documented history of prediabetes, with most recent hemoglobin A1c noted to be 6.0% in February 2022, present blood sugar found to be 313, but in the absence of any corresponding anion gap metabolic acidosis  to suggest DKA.  Presenting blood sugar actually appears similar to elevated levels noted on chart review over the last several months.  Suspect a degree of insulin resistance as a consequence of the patient's chronic alcohol abuse.   Plan: Add on hemoglobin A1c level.  Every 6 hour CBG monitoring with low-dose sliding scale insulin.         #) Leukocytosis: Presenting CBC reflects mildly elevated white cell count of 10,600. Suspect an element of hemoconcentration in the setting of evidence of dehydration from nausea/vomiting as well as presenting acute upper GI bleed. This may also be reactive in nature in the setting of corresponding nausea/vomiting over the last 2 days. No evidence to suggest underlying infectious process at this time, including urinalysis that was inconsistent with UTI.  No evidence of acute respiratory symptoms to warrant pursuit of chest x-ray at this time.  In the absence of any suspected underlying infection, criteria for sepsis not currently met.  Therefore, will refrain from initiation of antibiotics at this time.  Plan: Repeat CBC with diff in the morning.  Monitor strict I's and O's, daily weights.  Further evaluation management of presenting acute upper GI bleed, as above.  Start gentle IV fluids.             #) Dehydration: Clinical suspicion for such, including the appearance of dry oral mucous membranes as well as laboratory findings notable for elevated specific gravity on presenting urinalysis.  This is in the setting of recent increase in GI losses stemming from few days of intermittent nausea/vomiting associated with presenting acute upper GI bleed, as above.  Renal function appears at baseline.   Plan: Monitor strict I's and O's.  Daily weights.  Repeat CMP in the morning.  Gentle IV fluids, as above.           #) Chronic Alcohol Abuse: the patient reports typical daily alcohol consumption of at least a sixpack of 12 ounce beers on a daily  basis, and that typical daily alcohol consumption of this volume has been occurring for over a year. Denies any interest in alcohol consumption discontinuation at this time. Most recent alcohol consumption occurred on 06/18/2022. Close monitoring for development of evidence of alcohol withdrawal, including close attention  to trend in vital signs, as well as close monitoring of electrolytes, as described below.  No evidence at this time to suggest recurrent alcohol withdrawal.  Plan: counseled the patient on the importance of reduction in alcohol consumption. Consult to transition of care team placed. Close monitoring of ensuing BP and HR via routine VS. Symptoms-based CIWA protocol with prn Ativan ordered. Seizure precautions. Telemetry. Add-on serum Mg level. Check serum phosphorus level. Repeat CMP in the morning.  Recheck INR in the morning. Add-on serum ethanol level.  Daily IV thiamine injection, first dose now, with daily oral folic acid/multivitamin supplement ordered to start at the end of current n.p.o status.          #) Chronic anemia: Documented history of such, associated baseline hemoglobin range 10-12, presenting hemoglobin consistent with this range and associated mosaic/chronic findings as well as nonelevated RDW.  Appears related to the patient's chronic alcohol abuse, either as a result of direct hematopoietic toxicity of chronic alcohol abuse versus resultant early relative decline in hepatic function.  will closely monitor for ensuing evidence of acute blood loss anemia in the setting of his presenting acute upper GI bleed.  will refrain from assessing iron studies at this time given that patient's hemoglobin appears to be at baseline.  Type and screen completed today.   Plan: Further evaluation management present acute.  GI bleed, as above, including every 4 hours H&H's ordered through 1300.  Repeat INR in the morning.           #) Peripheral polyneuropathy: Documented  history of such, on gabapentin as an outpatient.  Plan: In the setting of current n.p.o. status, will hold home gabapentin for now.      DVT prophylaxis: SCD's   Code Status: Full code Family Communication: none Disposition Plan: Per Rounding Team Consults called: EDP consulted on-call [facility], Dr.  Bryan Lemma  for AM consult, as further detailed above;  Admission status: Observation    PLEASE NOTE THAT DRAGON DICTATION SOFTWARE WAS USED IN THE CONSTRUCTION OF THIS NOTE.   [place]on DO [facility]  From [place]   06/19/2022, 3:55 AM

## 2022-06-20 DIAGNOSIS — K269 Duodenal ulcer, unspecified as acute or chronic, without hemorrhage or perforation: Secondary | ICD-10-CM

## 2022-06-20 DIAGNOSIS — R7989 Other specified abnormal findings of blood chemistry: Secondary | ICD-10-CM

## 2022-06-20 LAB — BASIC METABOLIC PANEL
Anion gap: 9 (ref 5–15)
BUN: 6 mg/dL (ref 6–20)
CO2: 25 mmol/L (ref 22–32)
Calcium: 8.1 mg/dL — ABNORMAL LOW (ref 8.9–10.3)
Chloride: 102 mmol/L (ref 98–111)
Creatinine, Ser: 0.53 mg/dL — ABNORMAL LOW (ref 0.61–1.24)
GFR, Estimated: >60 mL/min (ref >60)
Glucose, Bld: 132 mg/dL — ABNORMAL HIGH (ref 70–99)
Potassium: 4.2 mmol/L (ref 3.5–5.1)
Sodium: 136 mmol/L (ref 135–145)

## 2022-06-20 LAB — CBC WITH DIFFERENTIAL/PLATELET
Abs Immature Granulocytes: 0.04 10*3/uL (ref 0.00–0.07)
Basophils Absolute: 0.1 10*3/uL (ref 0.0–0.1)
Basophils Relative: 1 %
Eosinophils Absolute: 0.1 10*3/uL (ref 0.0–0.5)
Eosinophils Relative: 1 %
HCT: 27.7 % — ABNORMAL LOW (ref 39.0–52.0)
Hemoglobin: 9.5 g/dL — ABNORMAL LOW (ref 13.0–17.0)
Immature Granulocytes: 1 %
Lymphocytes Relative: 17 %
Lymphs Abs: 1.1 10*3/uL (ref 0.7–4.0)
MCH: 32.4 pg (ref 26.0–34.0)
MCHC: 34.3 g/dL (ref 30.0–36.0)
MCV: 94.5 fL (ref 80.0–100.0)
Monocytes Absolute: 0.6 10*3/uL (ref 0.1–1.0)
Monocytes Relative: 9 %
Neutro Abs: 4.6 10*3/uL (ref 1.7–7.7)
Neutrophils Relative %: 71 %
Platelets: 112 10*3/uL — ABNORMAL LOW (ref 150–400)
RBC: 2.93 MIL/uL — ABNORMAL LOW (ref 4.22–5.81)
RDW: 14.4 % (ref 11.5–15.5)
WBC: 6.5 10*3/uL (ref 4.0–10.5)
nRBC: 0 % (ref 0.0–0.2)

## 2022-06-20 LAB — VITAMIN B12: Vitamin B-12: 449 pg/mL (ref 180–914)

## 2022-06-20 LAB — SURGICAL PATHOLOGY

## 2022-06-20 LAB — GLUCOSE, CAPILLARY: Glucose-Capillary: 137 mg/dL — ABNORMAL HIGH (ref 70–99)

## 2022-06-20 MED ORDER — THIAMINE HCL 100 MG PO TABS: 100.0000 mg | ORAL_TABLET | Freq: Every day | ORAL | 3 refills | Status: DC

## 2022-06-20 MED ORDER — PANTOPRAZOLE SODIUM 40 MG PO TBEC: 40.0000 mg | DELAYED_RELEASE_TABLET | Freq: Two times a day (BID) | ORAL | 3 refills | Status: DC

## 2022-06-20 MED ORDER — PANTOPRAZOLE SODIUM 40 MG PO TBEC
40.0000 mg | DELAYED_RELEASE_TABLET | Freq: Two times a day (BID) | ORAL | Status: DC
  Administered 2022-06-20: 40 mg via ORAL
  Filled 2022-06-20: qty 1

## 2022-06-20 MED ORDER — CYANOCOBALAMIN 1000 MCG/ML IJ SOLN
1000.0000 ug | Freq: Once | INTRAMUSCULAR | Status: AC
Start: 2022-06-20 — End: 2022-06-20
  Administered 2022-06-20: 1000 ug via INTRAMUSCULAR
  Filled 2022-06-20: qty 1

## 2022-06-20 NOTE — Progress Notes (Signed)
Discharge paperwork reviewed with pt. Pt verbalized understanding. Pt alert and oriented x4 in no acute distress. Pt's grandmother with transport pt home. Grandmother en route. Volunteer services called to transport pt down to main entrance. Pt has taken all of his belongings.

## 2022-06-20 NOTE — TOC Transition Note (Signed)
Transition of Care [facility]) - CM/SW Discharge Note   Patient Details  Name: Shane Holmes MRN: [id number] Date of Birth: 05/28/1986  Transition of [facility] CM/SW Contact:  Harriet Masson, RN Phone Number: 06/20/2022, 11:48 AM   Clinical Narrative:     Patient stable for discharged. New PCP apt made. Patient informed that if he can't make the apt to please call to change them.Match letter re-instated. Patient has transportation.  Final next level of care: Home/Self Care Barriers to Discharge: Barriers Resolved   Patient Goals and CMS Choice Patient states their goals for this hospitalization and ongoing recovery are:: return home      Discharge Placement                 home      Discharge Plan and Services In-house Referral: PCP / [facility] Discharge Planning Services: Follow-up appt scheduled, [facility] Program                                 Social Determinants of Health (SDOH) Interventions     Readmission Risk Interventions    06/20/2022   11:48 AM  Readmission Risk Prevention Plan  Transportation Screening Complete  PCP or Specialist Appt within 3-5 Days Complete  HRI or Home Care Consult Complete  Social Work Consult for Recovery Care Planning/Counseling Complete  Palliative Care Screening Not Applicable  Medication Review [employer]) Complete

## 2022-06-20 NOTE — Progress Notes (Signed)
[facility] Progress Note  Chief Complaint: Hematemesis  History:  No further hematemesis or melena since admission.  Still reports epigastric pain.  When I entered the room, he is consuming a breakfast of eggs, bacon and hashbrowns, barely stopping that to make eye contact with me or lay back for an exam.  ROS: Cardiovascular: Denies chest pain Respiratory: Denies dyspnea Urinary: Denies dysuria  Objective:   Current Facility-Administered Medications:    acetaminophen (TYLENOL) tablet 650 mg, 650 mg, Oral, Q6H PRN, 650 mg at 06/19/22 1641 **OR** acetaminophen (TYLENOL) suppository 650 mg, 650 mg, Rectal, Q6H PRN, Danis, Starr Lake III, MD   cyanocobalamin ((VITAMIN B-12)) injection 1,000 mcg, 1,000 mcg, Intramuscular, Once, Rhetta Mura, MD   folic acid (FOLVITE) tablet 1 mg, 1 mg, Oral, Daily, Danis, Starr Lake III, MD, 1 mg at 06/19/22 0951   insulin aspart (novoLOG) injection 0-6 Units, 0-6 Units, Subcutaneous, Q6H, Danis, Starr Lake III, MD, 1 Units at 06/19/22 0816   lactated ringers infusion, , Intravenous, Continuous, Danis, Starr Lake III, MD, Last Rate: 50 mL/hr at 06/20/22 0454, New Bag at 06/20/22 0454   LORazepam (ATIVAN) tablet 1-4 mg, 1-4 mg, Oral, Q1H PRN, 2 mg at 06/19/22 2026 **OR** LORazepam (ATIVAN) injection 1-4 mg, 1-4 mg, Intravenous, Q1H PRN, Charlie Pitter III, MD, 2 mg at 06/20/22 0447   multivitamin with minerals tablet 1 tablet, 1 tablet, Oral, Daily, Danis, Starr Lake III, MD, 1 tablet at 06/19/22 0951   naloxone (NARCAN) injection 0.4 mg, 0.4 mg, Intravenous, PRN, Danis, Starr Lake III, MD   nicotine (NICODERM CQ - dosed in mg/24 hours) patch 14 mg, 14 mg, Transdermal, Daily, Eduard Clos, MD, 14 mg at 06/20/22 0129   ondansetron (ZOFRAN) injection 4 mg, 4 mg, Intravenous, Q6H PRN, Danis, Starr Lake III, MD   pantoprazole (PROTONIX) EC tablet 40 mg, 40 mg, Oral, BID, Mahala Menghini, Jai-Gurmukh, MD   pantoprozole (PROTONIX) 80 mg /NS 100 mL infusion, 8 mg/hr, Intravenous,  Continuous, Danis, Starr Lake III, MD, Stopped at 06/20/22 0830   pregabalin (LYRICA) capsule 25 mg, 25 mg, Oral, BID, Danis, Starr Lake III, MD, 25 mg at 06/19/22 2026   thiamine tablet 100 mg, 100 mg, Oral, Daily, 100 mg at 06/19/22 0951 **OR** thiamine (B-1) injection 100 mg, 100 mg, Intravenous, Daily, Danis, Starr Lake III, MD   lactated ringers 50 mL/hr at 06/20/22 0454   pantoprazole Stopped (06/20/22 0830)     Vital signs in last 24 hrs: Vitals:   06/20/22 0450 06/20/22 0831  BP: 128/87 (!) 139/95  Pulse: 99 97  Resp: 16 16  Temp: 98.3 F (36.8 C) 97.6 F (36.4 C)  SpO2: 98% 96%    Intake/Output Summary (Last 24 hours) at 06/20/2022 0901 Last data filed at 06/20/2022 0830 Gross per 24 hour  Intake 614.21 ml  Output 1250 ml  Net -635.79 ml     Physical Exam  HEENT: sclera mildly icteric Cardiac: RRR without murmurs, S1S2 heard, no peripheral edema Pulm: clear to auscultation bilaterally, normal RR and effort noted Abdomen: soft, mild epigastric tenderness, with active bowel sounds.  Skin; warm and dry, no jaundice Neuro: Normal mentation, normal gross motor function Recent Labs:     Latest Ref Rng & Units 06/20/2022    4:33 AM 06/19/2022    3:23 PM 06/19/2022    9:56 AM  CBC  WBC 4.0 - 10.5 K/uL 6.5     Hemoglobin 13.0 - 17.0 g/dL 9.5  9.4  9.5   Hematocrit  39.0 - 52.0 % 27.7  28.2  28.7   Platelets 150 - 400 K/uL 112       Recent Labs  Lab 06/19/22 0604  INR 1.2      Latest Ref Rng & Units 06/20/2022    4:33 AM 06/19/2022    6:04 AM 06/18/2022    9:49 PM  CMP  Glucose 70 - 99 mg/dL 001  749  449   BUN 6 - 20 mg/dL 6  10  9    Creatinine 0.61 - 1.24 mg/dL  6.75  9.16   Sodium 135 - 145 mmol/L 136  136  136   Potassium 3.5 - 5.1 mmol/L 4.2  3.5  3.9   Chloride 98 - 111 mmol/L 102  101  98   CO2 22 - 32 mmol/L 25  27  24    Calcium 8.9 - 10.3 mg/dL 8.1  7.8  8.5   Total Protein 6.5 - 8.1 g/dL  7.0  7.3   Total Bilirubin 0.3 - 1.2 mg/dL  2.1  1.8    Alkaline Phos 38 - 126 U/L  134  160   AST 15 - 41 U/L  97  96   ALT 0 - 44 U/L  21  21    Lipase 64 on admission  Radiologic studies:   Assessment & Plan  Assessment: Hematemesis  Melena  Acute blood loss anemia on top of anemia related to 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  Elevated LFTs from alcohol abuse and cirrhosis.  Grade 1 esophageal varices, no beta-blocker prophylaxis needed.  That was also not the source of bleeding  Gastric erosions and duodenal ulcers and an esophageal ulcer.  Any or all of these may have been the bleeding sources.  Clean-based ulcers with no stigmata, no endoscopic therapy required.  Biopsies pending for H. pylori.  Long segment appearance of Barrett's esophagus, biopsies pending.  Plan: Avoid NSAIDs and alcohol completely  Twice daily pantoprazole 40 mg for 8 weeks  Follow-up biopsy results -patient will be contacted by our office after results available.  He can be discharged today from my perspective.  I will message Dr. 3.84 to help arrange follow-up.  I expect that may be a challenge for this patient given his ongoing alcohol abuse and lack of insurance.  Nevertheless, he will at least be offered follow-up for these multiple issues and a repeat upper endoscopy in about 8 weeks to assess ulcer healing and take further Barrett's biopsies.   III Office: [phone]

## 2022-06-20 NOTE — Discharge Instructions (Addendum)
                   Outpatient Substance Abuse  Treatment- uninsured  Narcotics Anonymous 24-HOUR HELPLINE Pre-recorded for Meeting Schedules [facility] [phone]  [email or web address] ALCOHOLICS ANONYMOUS  [place]  Answering Service [phone] Please Note: All [place] Meetings are Non-smoking [email or web address]  [facility] Drug Services -  Insurance: Medicaid /State funding/private insurance Methadone, suboxone/Intensive outpatient  [place] [phone] Fax: [phone] [address], [place], [place], [address] [place] [phone] Fax: [phone]    [address], [place], [place], [address] (Serves [place], [place], [place], [place], [place], [place], [place], [place]) [facility] [email or web address] Accepts State funding/Medicaid Transitional housing, Intensive Outpatient Treatment, Outpatient treatment, Veterans Services  Phone: [phone] Fax: [phone] Address: [address], [place] [address]   [facility] (http://[email or web address]/) Insurance: Medicaid Case Management, Community Support Team, Medication Management, Outpatient Therapy, Psychosocial Rehabilitation, Substance Abuse Intensive Outpatient  Phone: [phone] Fax: [phone] [address], [place], [place], [address]  [facility] Medicaid, most private insurance providers Types of Program: Individual/Group Therapy, Substance Abuse Treatment  Phone: [place] [phone] Fax: [phone] [address], [place], [place], [address] [place] [phone] [address], [place], [place], [address]  [facility]  Medicaid Types of Program: SAIOP  Phone: [phone] Fax: [phone] [address], [place], [place], [address] [facility] Medicaid/state funds Crisis line [phone] [place]-[address] [phone] [place] -[address] [phone]  [facility] [address];  [place], [place] [address] [phone]  Substance Abuse Intensive Outpatient Program [facility] [address] [place], [place] [address] [phone]- Substance abuse treatment  [facility] Trans[facility]  Insurance: [place] Medicaid, BCBS, sliding scale Types of Program: substance abuse treatment, transportation assistance Phone: [phone] Fax: [phone] Address: [address], [place] [address] [facility] ([facility]://[facility].[email or web address]/) Insurance: UHC, BCBS, Aetna, Medicaid of [place] Program: addiction counseling, detoxification,  Phone: [phone]  Fax: [phone] Address: [address], [place] [address]  [facility] (statewide facilities/programs) [address] (Medicaid/state funds) [place], [place] [address]                      [facility] [phone] [place]- [phone] [place]- [phone] [facility] (2 Locations) (Medicaid/state funds) --[address]  walk in 8:30-12 and 1-2:30 [place]   PH- [phone] --[address] [place], [place] [address]  [phone] walk in 8:30-12 and 2-3:30  [facility] state funds/medicaid [address]  [place], [place] [address] [phone] [facility] (Suboxone clinic) Medicaid/state funds  [address]  [place], [place] [address] [phone]   [facility]  [place]-[address], [place], [place] [address]  [phone] (24 hours) [place]- [address] [place], [place] [address]  [phone] (24 hours) [place]- [address] [place] [phone] [place]- [address] [place] [phone] [place]- [address] [place] [address] [place] [phone] [facility]- Medicaid and state funds  [place]- [address] [place], [place] [address] [phone] (24 hours) [place]- [address] [place], [place]  [address] [phone] [place]- [address] [place], [place] [address] [phone] (24 hours) [place] [address] [place], [place]  [address] [phone] [place]- [address] [place] [phone]    

## 2022-06-20 NOTE — Discharge Summary (Signed)
Physician Discharge Summary  Shane Holmes [id number] DOB: 1986/08/29 DOA: 06/18/2022  PCP: Pcp, No  Admit date: 06/18/2022 Discharge date: 06/20/2022  Time spent: 37 minutes  Recommendations for Outpatient Follow-up:  Needs outpatient follow-up for consideration of endoscopy in about 8 weeks given suspicion of Barrett's esophagus -Follow-up pathology from endoscopic sampling Needs CBC Chem-12 in about 1 week Recommend outpatient referral to neurology for neuropathy-please follow-up B12 level that was performed prior to discharge  Discharge Diagnoses:  MAIN problem for hospitalization   GI bleed  Please see below for itemized issues addressed in [place]- refer to other progress notes for clarity if needed  Discharge Condition: Improved  Diet recommendation: Regular  Filed Weights   06/20/22 0459  Weight: 88.9 kg    History of present illness:    36 year old white male, known chronic EtOH microcytic anemia, tobacco abuse, prior asthma admitted 2022 asymptomatic COVID asymptomatic at the time--and GI at the time recommended outpatient endoscopy--autoimmune work-up was performed at the time secondary to bilateral lower extremity weakness   He was seen in the ED 7/56/21 for leg spasms and pain   Return to the ED 06/18/2022 chronic leg pain that was worse-found to have hematemesis as well as dark stools X 3 days Given pantoprazole gtt. given Ativan and oxycodone as well as Zofran CT abdomen pelvis no evidence of acute intra-abdominal or pelvic process but fatty liver rectal exam dark appearing stool fecal occult was positive Dr Bryan Lemma GI was consulted  Ultimately patient underwent endoscopy which showed esophageal mucosal changes suspicious for Barrett's esophagus and middle third 2 biopsies were taken and histology was to be followed Patient was found to have portal hypertensive gastropathy and diminutive erosions as well there was no overt bleeding however  Patient was  seen by GI subsequent to the EGD the next day and it was felt that patient had stabilized for discharge as he is eating full meals and tolerating them-he continues to complain of lower extremity discomfort and pain and will need outpatient work-up  We obtained a B12 level.  Menance dose of the 1000 mcg B12 and he will need to be seen again in the outpatient setting by PCP/neurology follow-up for I offered him yrica for the neuropathy and he was not interested in taking this-given that he should not take NSAIDs and he should not be on narcotics for this type  Discharge Exam: Vitals:   06/20/22 0450 06/20/22 0831  BP: 128/87 (!) 139/95  Pulse: 99 97  Resp: 16 16  Temp: 98.3 F (36.8 C) 97.6 F (36.4 C)  SpO2: 98% 96%    Subj on day of d/c   Improved Had a full meal this morning no chest pain no fever Continues to complain of lower extremity discomfort  General Exam on discharge  EOMI NCAT no focal deficit chest clear Abdomen soft no rebound no guarding   Discharge Instructions   Discharge Instructions     Diet - low sodium heart healthy   Complete by: As directed    Discharge instructions   Complete by: As directed    Take protonix x 2 daily  follow with Dr. Bryan Lemma for repeat scope Get labs 1 week   Increase activity slowly   Complete by: As directed       Allergies as of 06/20/2022       Reactions   Lidocaine-glycerin Other (See Comments)   Redness to application site        Medication List  STOP taking these medications    methocarbamol 500 MG tablet Commonly known as: ROBAXIN       TAKE these medications    albuterol 108 (90 Base) MCG/ACT inhaler Commonly known as: VENTOLIN HFA INHALE 2 PUFFS INTO THE LUNGS EVERY SIX HOURS AS NEEDED FOR WHEEZING OR SHORTNESS OF BREATH.   folic acid 1 MG tablet Commonly known as: FOLVITE Take 1 tablet (1 mg total) by mouth daily.   metFORMIN 500 MG tablet Commonly known as: GLUCOPHAGE Take 1 tablet  (500 mg total) by mouth 2 (two) times daily with a meal.   multivitamin with minerals Tabs tablet Take 1 tablet by mouth daily.   pantoprazole 40 MG tablet Commonly known as: PROTONIX Take 1 tablet (40 mg total) by mouth 2 (two) times daily.   potassium chloride SA 20 MEQ tablet Commonly known as: KLOR-CON M Take 1 tablet (20 mEq total) by mouth daily.   thiamine 100 MG tablet Take 1 tablet (100 mg total) by mouth daily. Start taking on: June 21, 2022       Allergies  Allergen Reactions   Lidocaine-Glycerin Other (See Comments)    Redness to application site    Follow-up Information     Rema Fendt, NP Follow up on 07/16/2022.   Specialty: Nurse Practitioner Why: Ricky Stabs @10 : information: [address] [place] [phone]         [phone], DO. Schedule an appointment as soon as possible for a visit in 2 month(s).   Specialty: Gastroenterology Why: u need s cope--call to get it scheduled Contact information: [address] [place] [phone]                  The results of significant diagnostics from this hospitalization (including imaging, microbiology, ancillary and laboratory) are listed below for reference.    Significant Diagnostic Studies: CT ABDOMEN PELVIS W CONTRAST  Result Date: 06/19/2022 CLINICAL DATA:  Abdominal pain. EXAM: CT ABDOMEN AND PELVIS WITH CONTRAST TECHNIQUE: Multidetector CT imaging of the abdomen and pelvis was performed using the standard protocol following bolus administration of intravenous contrast. RADIATION DOSE REDUCTION: This exam was performed according to the departmental dose-optimization program which includes automated exposure control, adjustment of the mA and/or kV according to patient size and/or use of iterative reconstruction technique. CONTRAST:  06/21/2022 OMNIPAQUE IOHEXOL 300 MG/ML  SOLN COMPARISON:  CT abdomen pelvis dated 11/03/2019.  FINDINGS: Lower chest: The visualized lung bases are clear. No intra-abdominal free air or free fluid. Hepatobiliary: Fatty liver. The liver is enlarged measuring 20 cm in midclavicular length. No biliary dilatation. The gallbladder is unremarkable. Pancreas: Unremarkable. No pancreatic ductal dilatation or surrounding inflammatory changes. Spleen: The spleen is enlarged measuring 16 cm in length. Adrenals/Urinary Tract: The adrenal glands unremarkable. Small right renal upper pole hypodense lesion similar to prior CT, poorly characterized on this CT. There is no hydronephrosis on either side. The visualized ureters and urinary bladder probable. Stomach/Bowel: There is no bowel obstruction or active inflammation. There is a small hiatal hernia. The appendix is normal. Vascular/Lymphatic: Mild atherosclerotic calcification of the aorta. The IVC is unremarkable. No portal venous gas. There is no adenopathy. Reproductive: The prostate and seminal vesicles are grossly remarkable. No pelvic mass. Other: None Musculoskeletal: No acute or significant osseous findings. IMPRESSION: 1. No acute intra-abdominal or pelvic pathology. 2. Enlarged fatty liver may represent steatohepatitis. Clinical correlation is recommended. 3. Splenomegaly. 4. Aortic Atherosclerosis (ICD10-I70.0). Electronically Signed  By: Elgie Collard M.D.   On: 06/19/2022 01:33    Microbiology: No results found for this or any previous visit (from the past 240 hour(s)).   Labs: Basic Metabolic Panel: Recent Labs  Lab 06/18/22 2149 06/19/22 0604 06/20/22 0433  NA 136 136 136  K 3.9 3.5 4.2  CL 98 101 102  CO2 24 27 25   GLUCOSE 313* 177* 132*  BUN 9 10 6   CREATININE 0.61 0.48* 0.53*  CALCIUM 8.5* 7.8* 8.1*  MG  --  1.4*  --   PHOS  --  3.4  --    Liver Function Tests: Recent Labs  Lab 06/18/22 2149 06/19/22 0604  AST 96* 97*  ALT 21 21  ALKPHOS 160* 134*  BILITOT 1.8* 2.1*  PROT 7.3 7.0  ALBUMIN 3.2* 2.9*   Recent Labs   Lab 06/18/22 2149  LIPASE 64*   No results for input(s): "AMMONIA" in the last 168 hours. CBC: Recent Labs  Lab 06/18/22 2149 06/19/22 0604 06/19/22 0956 06/19/22 1523 06/20/22 0433  WBC 10.6* 7.5  --   --  6.5  NEUTROABS 7.8* 5.2  --   --  4.6  HGB 10.6* 9.7* 9.5* 9.4* 9.5*  HCT 32.9* 28.9* 28.7* 28.2* 27.7*  MCV 97.6 95.1  --   --  94.5  PLT 133* 109*  --   --  112*   Cardiac Enzymes: No results for input(s): "CKTOTAL", "CKMB", "CKMBINDEX", "TROPONINI" in the last 168 hours. BNP: BNP (last 3 results) No results for input(s): "BNP" in the last 8760 hours.  ProBNP (last 3 results) No results for input(s): "PROBNP" in the last 8760 hours.  CBG: Recent Labs  Lab 06/19/22 0713 06/19/22 1230 06/19/22 1558 06/20/22 0451  GLUCAP 186* 169* 132* 137*       Signed:  06/21/22 MD   [facility] 06/20/2022, 11:05 AM

## 2022-06-23 ENCOUNTER — Encounter: Payer: Self-pay | Admitting: Gastroenterology

## 2022-07-08 NOTE — Progress Notes (Signed)
Erroneous encounter-disregard

## 2022-07-16 ENCOUNTER — Encounter: Payer: Self-pay | Admitting: Family

## 2022-07-16 DIAGNOSIS — Z7689 Persons encountering health services in other specified circumstances: Secondary | ICD-10-CM

## 2022-07-16 DIAGNOSIS — R1013 Epigastric pain: Secondary | ICD-10-CM

## 2022-07-16 DIAGNOSIS — G629 Polyneuropathy, unspecified: Secondary | ICD-10-CM

## 2022-07-16 DIAGNOSIS — D649 Anemia, unspecified: Secondary | ICD-10-CM

## 2022-07-16 DIAGNOSIS — D72829 Elevated white blood cell count, unspecified: Secondary | ICD-10-CM

## 2022-07-16 DIAGNOSIS — R739 Hyperglycemia, unspecified: Secondary | ICD-10-CM

## 2022-07-16 DIAGNOSIS — K921 Melena: Secondary | ICD-10-CM

## 2022-07-16 DIAGNOSIS — Z09 Encounter for follow-up examination after completed treatment for conditions other than malignant neoplasm: Secondary | ICD-10-CM

## 2022-07-16 DIAGNOSIS — F1010 Alcohol abuse, uncomplicated: Secondary | ICD-10-CM

## 2022-07-16 DIAGNOSIS — E860 Dehydration: Secondary | ICD-10-CM

## 2022-07-16 DIAGNOSIS — K922 Gastrointestinal hemorrhage, unspecified: Secondary | ICD-10-CM

## 2022-08-22 NOTE — Progress Notes (Signed)
Initial neurology clinic note  SERVICE DATE: 08/27/22 SERVICE TIME: 9:30 am  Reason for Evaluation: Consultation requested by Orpah Greek,* for an opinion regarding leg spasms, weakness, and pain. My final recommendations will be communicated back to the requesting physician by way of shared medical record or letter to requesting physician via [place] mail.  HPI: This is [name], a 36 y.o. right-handed male with a medical history of DM, asthma, EtOH abuse, back pain who presents to neurology clinic with the chief complaint of leg pain, weakness, and spasms. The patient is accompanied by girlfriend.  Patient's symptoms started about 1.5 years ago. He had gradually had muscle aches that worsened. He had to be hospitalized about 1.5 years ago for this. He was doing better, but then things progressed. He currently has numbness and electric sensations at least from his thighs to the tips of feet. Patient has a lot of cramps in his legs, particularly in the hamstrings. The spasms occur only at night, when he lays down. They were very consistent until more recently as they have calmed and become more random now. He has great difficulty walking, especially in the mornings. He feels pressure when walking. He denies any significant change in back pain.  Patient was seen in the ED 06/07/22 for muscle spasms. No acute abnormalities were found, so patient was referred to outpatient neurology.  Patient was previously evaluated by inpatient neurology, first in 01/24/21 when he bilateral leg weakness in the setting of COVID + and GI bleed. There was some concern for GBS so MRI and LP were performed. LP was normal. MRI showed chronic L5 pars fractures with degeneration but no spinal stenosis or neural impingement. Patient was diagnosed with alcoholic polyneuropathy and asked to follow up with neurology as outpatient, which he never did.   He was seen again by inpatient neurology on 01/09/22 for acute  onset BLE numbness and weakness and RUE weakness and numbness. Exam was suggestive of functional weakness at that time. MRI cervical and thoracic spine was recommended, but patient would not lay still for MRI. He left the ED prior to being discharged.  The patient has not had similar episodes of symptoms in the past.   Any change in urine color, especially after exertion/physical activity? No  The patient has not noticed any recent skin rashes nor does he report any constitutional symptoms like fever, night sweats, anorexia or unintentional weight loss.  EtOH use: Currently drinks 6 beers per day; 12 beers per day (per patient report in 2022) Restrictive diet? No Family history of neuropathy/myopathy/NM disease? No  Patient has not had a previous EMG.   MEDICATIONS:  Outpatient Encounter Medications as of 08/27/2022  Medication Sig   albuterol (VENTOLIN HFA) 108 (90 Base) MCG/ACT inhaler INHALE 2 PUFFS INTO THE LUNGS EVERY SIX HOURS AS NEEDED FOR WHEEZING OR SHORTNESS OF BREATH.   folic acid (FOLVITE) 1 MG tablet Take 1 tablet (1 mg total) by mouth daily.   Multiple Vitamin (MULTIVITAMIN WITH MINERALS) TABS tablet Take 1 tablet by mouth daily.   metFORMIN (GLUCOPHAGE) 500 MG tablet Take 1 tablet (500 mg total) by mouth 2 (two) times daily with a meal. (Patient not taking: Reported on 06/19/2022)   pantoprazole (PROTONIX) 40 MG tablet Take 1 tablet (40 mg total) by mouth 2 (two) times daily. (Patient not taking: Reported on 08/27/2022)   thiamine 100 MG tablet Take 1 tablet (100 mg total) by mouth daily. (Patient not taking: Reported on 08/27/2022)   [  DISCONTINUED] dicyclomine (BENTYL) 20 MG tablet Take 1 tablet (20 mg total) by mouth 2 (two) times daily. (Patient not taking: Reported on 11/02/2019)   No facility-administered encounter medications on file as of 08/27/2022.    PAST MEDICAL HISTORY: Past Medical History:  Diagnosis Date   Alcohol abuse    Asthma    Back pain    Bulging  lumbar disc    COVID    01/2021   Encounter for lumbar puncture    Hematochezia    Hypokalemia    Hyponatremia    Lower GI bleed    Neuropathy     PAST SURGICAL HISTORY: Past Surgical History:  Procedure Laterality Date   BIOPSY  06/19/2022   Procedure: BIOPSY;  Surgeon: Doran Stabler, MD;  Location: [facility];  Service: Gastroenterology;;   ESOPHAGOGASTRODUODENOSCOPY (EGD) WITH PROPOFOL N/A 06/19/2022   Procedure: ESOPHAGOGASTRODUODENOSCOPY (EGD) WITH PROPOFOL;  Surgeon: Doran Stabler, MD;  Location: [facility];  Service: Gastroenterology;  Laterality: N/A;    ALLERGIES: Allergies  Allergen Reactions   Lidocaine-Glycerin Other (See Comments)    Redness to application site    FAMILY HISTORY: Family History  Problem Relation Age of Onset   Cirrhosis Mother    Cirrhosis Father    Cancer Maternal Grandfather    Cancer Paternal Grandfather    Diabetes Maternal Aunt    Hypertension Neg Hx     SOCIAL HISTORY: Social History   Tobacco Use   Smoking status: Some Days    Types: Cigarettes   Smokeless tobacco: Never   Tobacco comments:    Close to a pack at day  Vaping Use   Vaping Use: Never used  Substance Use Topics   Alcohol use: Yes    Alcohol/week: 6.0 standard drinks of alcohol    Types: 6 Cans of beer per week    Comment: at day   Drug use: Yes    Types: Marijuana    Comment: cocaine positive 01/2022/ CbD   Social History   Social History Narrative   Right handed   Caffeine 1 can a day   Live in one level home     OBJECTIVE: PHYSICAL EXAM: BP (!) 129/90   Pulse (!) 102   Ht 5\' 11"  (1.803 m)   Wt 199 lb (90.3 kg)   SpO2 96%   BMI 27.75 kg/m   General: General appearance: Awake and alert. No distress. Cooperative with exam.  HEENT: Atraumatic. Anicteric. Lungs: Non-labored breathing on room air  Extremities: No edema. No obvious deformity.  Musculoskeletal: No obvious joint swelling. Psych: Affect  appropriate.  Neurological: Mental Status: Alert. Speech fluent. No pseudobulbar affect Cranial Nerves: CNII: No RAPD. Visual fields intact. CNIII, IV, VI: PERRL. No nystagmus. EOMI. CN V: Facial sensation intact bilaterally to fine touch. CN VII: Facial muscles symmetric and strong. No ptosis at rest. CN VIII: Hears finger rub well bilaterally. CN IX: No hypophonia. CN X: Palate elevates symmetrically. CN XI: Full strength shoulder shrug bilaterally. CN XII: Tongue protrusion full and midline. No atrophy or fasciculations. No significant dysarthria Motor: Tone is normal. No fasciculations in extremities. No clear atrophy.  Individual muscle group testing (MRC grade out of 5):  Movement     Neck flexion 5    Neck extension 5     Right Left   Shoulder abduction 5 5   Elbow flexion 5 5   Elbow extension 5 5   Finger abduction - FDI 5 5   Finger abduction -  ADM 5 5   Finger extension 5 5   Finger distal flexion - 2/3 5 5    Finger distal flexion - 4/5 5 5    Thumb flexion - FPL 5 5    Hip flexion 5- 5-   Hip extension 5- 5-   Hip adduction 5 5   Hip abduction 5 5   Knee extension 5- 5-   Knee flexion 5- 5-   Dorsiflexion 5 5   Plantarflexion 5 5   Inversion 5 5   Eversion 5 5   Great toe extension 4+ 4+   Great toe flexion 5- 5-     Reflexes:  Right Left   Bicep 1+ 1+   Tricep Trace Trace   BrRad Trace Trace   Knee 2+ 1+   Ankle 0 0    Pathological Reflexes: Babinski: mute response bilaterally Hoffman: absent bilaterally Troemner: absent bilaterally Sensation: Pinprick: Intact in bilateral upper extremities. Absent to knees in bilateral lower extremities. Vibration: Intact in bilateral upper extremities. Absent at bilateral great toes, faint at bilateral ankles, intact at bilateral knees Proprioception: Absent in bilateral great toes Coordination: Intact finger-to- nose-finger bilaterally. Romberg with mild sway. Gait: Difficulty with rising from chair  with arms crossed unassisted. Antalgic gait. Difficulty with tandem walk and walking toes and heels.  Lab and Test Review: Internal labs: Normal or unremarkable: CK (59)  06/20/22: CBC significant for anemia (Hb of 9.5), platelets low at 112 BMP significant for glucose to 132, calcium to 8.1 B12: 449 HbA1c: 7.4  EtOH (in ED on 06/07/22): 202, (01/09/22): 274, (11/02/19): 211  MRI lumbar spine w/wo contrast (01/25/21): FINDINGS: Segmentation:  Normal on the 2020 CT.   Alignment: Normal lumbar lordosis. Stable vertebral height and alignment since 2010.   Vertebrae: No marrow edema or evidence of acute osseous abnormality. Visualized bone marrow signal is within normal limits. Intact visible sacrum and SI joints.   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 No associated spondylolisthesis at that level. See additional facet details below.   Conus medullaris and cauda equina: Conus extends to the T12-L1 level. No lower spinal cord or conus signal abnormality. Small fatty filum terminalis (series 9, image 27, normal variant). Cauda equina nerve roots appear normal. No abnormal intradural enhancement or dural thickening.   Paraspinal and other soft tissues: Small area of patchy abnormal STIR signal and subtle soft tissue enhancement in the deep left paraspinal erector spinae muscles adjacent to the L4-L5 posterior elements (series 6, image 11 and series 10, image 31). No fluid collection, and the other bilateral paraspinal muscles remain normal. This area was normal on the 2010 MRI. No underlying acute osseous abnormality.   Negative visible abdominal viscera.   Disc levels:   T11-T12: Partially visible and negative.   T12-L1:  Negative.   L1-L2:  Negative.   L2-L3:  Negative.   L3-L4:  Negative.   L4-L5: Negative disc. Mild to moderate facet and ligament flavum hypertrophy. Trace degenerative facet joint fluid on the left, which is in proximity to the  small area of abnormal paraspinal soft tissue described earlier. But there is no marrow edema in the facets. No stenosis.   L5-S1: Negative disc. Mild facet hypertrophy with trace degenerative facet joint fluid. No stenosis.   IMPRESSION: 1. Small 2 cm area of soft tissue inflammation/myositis in the left erector spinous muscle at L4-L5. There is regional chronic L4-L5 facet degeneration (see #2) but no associated osseous abnormality and no other acute or  inflammatory process in the lumbar spine.   2. 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 But normal lumbar discs with no spinal stenosis or neural impingement.  MRI cervical and thoracic spine wo contrast (01/09/22): FINDINGS: MRI CERVICAL SPINE FINDINGS   Alignment: Examination degraded by motion artifact. Physiologic with preservation of the normal cervical lordosis. No listhesis.   Vertebrae: Vertebral body height maintained without acute or chronic fracture. Bone marrow signal intensity within normal limits. No discrete or worrisome osseous lesions or abnormal marrow edema.   Cord: Grossly normal signal and morphology. No convincing cord signal changes on this motion degraded exam.   Posterior Fossa, vertebral arteries, paraspinal tissues: Unremarkable.   Disc levels:   No significant disc pathology seen within the cervical spine. No disc bulge or focal disc herniation. No significant facet disease. No canal or neural foraminal stenosis or evidence for neural impingement.   MRI THORACIC SPINE FINDINGS   Alignment:  Examination degraded by motion artifact.   Vertebral bodies normally aligned with preservation of the normal thoracic kyphosis. No listhesis.   Vertebrae: Minimal chronic height loss noted at the superior endplate of T3. Otherwise, vertebral body height maintained. Underlying bone marrow signal intensity within normal limits. No discrete or worrisome osseous lesions. No  abnormal marrow edema.   Cord: There is slight anterior/ventral displacement of the upper thoracic spinal cord at the level of T4-5 (series 9, image 11). Posterior aspect of the cord is flattened (series 12, image 14). Finding raises the possibility for a subtle extramedullary, intradural lesion at this location, possibly an arachnoid cyst, although this is not definitely seen on this motion degraded exam. No definite dural defect, although a possible focal spinal cord herniation could also be considered. No visible associated cord signal changes on this motion degraded exam. Thoracic spinal cord is otherwise normal in appearance.   Paraspinal and other soft tissues: Unremarkable.   Disc levels:   Small chronic degenerative endplate Schmorl's node noted at the inferior endplate of T9. Otherwise, no other significant disc pathology seen within the thoracic spine. No disc bulge or focal disc herniation. No spinal stenosis. Foramina remain patent.   IMPRESSION: 1. Motion degraded exam. 2. Slight anterior/ventral displacement of the upper thoracic spinal cord at the level of T4-5. Finding raises the possibility for a subtle extramedullary, intradural lesion at this location, possibly an arachnoid cyst, although this is not definitely seen on this motion degraded exam. Possible focal spinal cord herniation could also be considered. No visible associated cord signal changes. Further evaluation with dedicated CT myelography may be helpful for further evaluation as warranted. Postcontrast imaging could also be considered. 3. Otherwise unremarkable and normal MRI of the cervical and thoracic spine. No significant disc pathology, stenosis, or evidence for neural impingement.  MRI cervical, thoracic, and lumbar spine (01/10/22): FINDINGS: MRI CERVICAL SPINE FINDINGS   Alignment: Normal   Vertebrae: No abnormal enhancement.   Cord: No abnormal intrathecal enhancement   Posterior  Fossa, vertebral arteries, paraspinal tissues: Negative for perispinal mass or inflammation.   Disc levels:   No neural impingement.   MRI THORACIC SPINE FINDINGS   Alignment:  Negative for listhesis.   Vertebrae: No abnormal vertebral enhancement.   Cord: No abnormal intrathecal enhancement. Specifically, no mass is seen at the level of dorsal cord mass effect at T4-5. No herniation is seen at this level either.   Paraspinal and other soft tissues: Negative for perispinal mass or inflammation. Hiatal hernia.   Disc levels:  No impingement   MRI LUMBAR SPINE FINDINGS   Segmentation:  5 lumbar type vertebrae   Alignment:  Borderline anterolisthesis at L5-S1   Vertebrae: Chronic bilateral L5 pars defects without marrow edema. Negative for aggressive bone lesion or abnormal enhancement   Conus medullaris: Extends to the L1 level and appears normal.   Paraspinal and other soft tissues: Negative for perispinal mass or inflammation   Disc levels:   Diffusely preserved disc height and hydration. Negative facets. No neural impingement.   IMPRESSION: 1. 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 No underlying intrathecal mass, cord herniation, or loss of CSF flow voids- appearance usually from arachnoid web. 2. Normal cord and cauda equina signal. 3. Chronic L5 pars defects.  MRI brain w/wo contrast (01/10/22): FINDINGS: Brain: No acute infarction, hemorrhage, hydrocephalus, extra-axial collection or mass lesion. No white matter disease, atrophy, or abnormal enhancement   Vascular: Normal flow voids and vascular enhancement   Skull and upper cervical spine: Is normal marrow signal   Sinuses/Orbits: Unremarkable   Other: Intermittent motion artifact.   IMPRESSION: Normal MRI of the brain.  ASSESSMENT: BUCK MCAFFEE is a 36 y.o. male who presents for evaluation of leg pain, numbness, tingling, and spasms. He has a relevant medical history of DM, EtOH abuse, and  chronic back pain . His neurological examination is pertinent for pain in bilateral lower extremities with absent sensation in distal to proximal gradient in lower extremities. Available diagnostic data is significant for HbA1c of 7.4, B12 of 449, anemia, thrombocytopenia, and normal CK.   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 Given his HbA1c, DM may also be contributing to his symptoms. Diabetic amyotrophy or diabetic lumbosacral radiculoplexopathy is less likely given the symmetric and distal distribution. Thiamine deficiency is another possible contributing factor. I will look for other treatable causes of neuropathy and spasms and get an EMG.  PLAN: -Blood work: ionized Ca, Vit D, PTH, IFE, B1, MMA, iron studies (iron, TIBC, sat %, and ferritin) -EMG: RLE > LLE (PN vs plexus) -Recommend taking thiamine as previously prescribed 100 mg -Discussed cutting back and quitting EtOH, but not cold [place] to avoid EtOH withdrawal -Recommended lidocaine cream for pain for now. Patient currently does not have an ID and preferred over the counter recommendations first. -OTC cramp recommendations given including tonic water, mag oxide 400 mg BID, hydration, and stretching  -Return to clinic in 6 months  The impression above as well as the plan as outlined below were extensively discussed with the patient (in the company of girlfriend) who voiced understanding. All questions were answered to their satisfaction.  When available, results of the above investigations and possible further recommendations will be communicated to the patient via telephone/MyChart. Patient to call office if not contacted after expected testing turnaround time.   Total time spent reviewing records, interview, history/exam, documentation, and coordination of care on day of encounter:  50 min   Thank you for allowing me to  participate in patient's care.  If I can answer any additional questions, I would be pleased to do so.  Kai Levins, MD   CC: Pcp, No No address on file  CC: Referring provider: Orpah Greek, MD [place] [address],  [place] [id number]

## 2022-08-27 ENCOUNTER — Encounter: Payer: Self-pay | Admitting: Neurology

## 2022-08-27 ENCOUNTER — Other Ambulatory Visit: Payer: Self-pay

## 2022-08-27 ENCOUNTER — Ambulatory Visit (INDEPENDENT_AMBULATORY_CARE_PROVIDER_SITE_OTHER): Payer: Self-pay | Admitting: Neurology

## 2022-08-27 VITALS — BP 129/90 | HR 102 | Ht 71.0 in | Wt 199.0 lb

## 2022-08-27 DIAGNOSIS — M79605 Pain in left leg: Secondary | ICD-10-CM

## 2022-08-27 DIAGNOSIS — E1142 Type 2 diabetes mellitus with diabetic polyneuropathy: Secondary | ICD-10-CM

## 2022-08-27 DIAGNOSIS — G621 Alcoholic polyneuropathy: Secondary | ICD-10-CM

## 2022-08-27 DIAGNOSIS — M79604 Pain in right leg: Secondary | ICD-10-CM

## 2022-08-27 DIAGNOSIS — R209 Unspecified disturbances of skin sensation: Secondary | ICD-10-CM

## 2022-08-27 LAB — VITAMIN D 25 HYDROXY (VIT D DEFICIENCY, FRACTURES): VITD: 35.06 ng/mL (ref 30.00–100.00)

## 2022-08-27 NOTE — Patient Instructions (Addendum)
I would like to get labs today to look for other treatable causes of nerve damage.  I recommend a muscle and nerve test called an EMG (more information below). You can schedule this today before you leave.  I will be in touch when I have the results of your testing.  You problems are likely due at least in part by your heavy alcohol use. I recommend cutting back. Do not stop cold [place] as this can cause problems as well. Cut back gradually.  I recommend you take your thiamine supplement that was prescribed previously (100 mg).  I recommend Lidocaine cream - rub where you have pain/tingling. Wear gloves or your hands will be numb.  - Recommend the following measures that may provide some symptomatic benefit for muscle twitching and/or cramps: - Adequate oral clear fluid intake to maintain optimal hydration (about 2.5 liters, or around 8-10 glasses per day) Avoidance of caffeine Trial of DIET tonic water: About 1 glass, up to 6 times daily Magnesium oxide up to 400 mg by mouth twice daily, as needed (over the counter) Gentle muscle stretching routine, especially before bedtime  I want to see you back in clinic in 6 months.  The physicians and staff at [facility] are committed to providing excellent care. You may receive a survey requesting feedback about your experience at our office. We strive to receive "very good" responses to the survey questions. If you feel that your experience would prevent you from giving the office a "very good " response, please contact our office to try to remedy the situation. We may be reached at [phone]. Thank you for taking the time out of your busy day to complete the survey.   Kai Levins, MD [facility]  ELECTROMYOGRAM AND NERVE CONDUCTION STUDIES (EMG/NCS) INSTRUCTIONS  How to Prepare The neurologist conducting the EMG will need to know if you have certain medical conditions. Tell the neurologist and other EMG lab personnel if  you: Have a pacemaker or any other electrical medical device Take blood-thinning medications Have hemophilia, a blood-clotting disorder that causes prolonged bleeding Bathing Take a shower or bath shortly before your exam in order to remove oils from your skin. Don't apply lotions or creams before the exam.  What to Expect You'll likely be asked to change into a hospital gown for the procedure and lie down on an examination table. The following explanations can help you understand what will happen during the exam.  Electrodes. The neurologist or a technician places surface electrodes at various locations on your skin depending on where you're experiencing symptoms. Or the neurologist may insert needle electrodes at different sites depending on your symptoms.  Sensations. The electrodes will at times transmit a tiny electrical current that you may feel as a twinge or spasm. The needle electrode may cause discomfort or pain that usually ends shortly after the needle is removed. If you are concerned about discomfort or pain, you may want to talk to the neurologist about taking a short break during the exam.  Instructions. During the needle EMG, the neurologist will assess whether there is any spontaneous electrical activity when the muscle is at rest - activity that isn't present in healthy muscle tissue - and the degree of activity when you slightly contract the muscle.  He or she will give you instructions on resting and contracting a muscle at appropriate times. Depending on what muscles and nerves the neurologist is examining, he or she may ask you to change positions during  the exam.  After your EMG You may experience some temporary, minor bruising where the needle electrode was inserted into your muscle. This bruising should fade within several days. If it persists, contact your primary care doctor.

## 2022-08-31 LAB — IRON,TIBC AND FERRITIN PANEL
%SAT: 20 % (calc) (ref 20–48)
Ferritin: 37 ng/mL — ABNORMAL LOW (ref 38–380)
Iron: 84 ug/dL (ref 50–180)
TIBC: 418 mcg/dL (calc) (ref 250–425)

## 2022-08-31 LAB — VITAMIN B1: Vitamin B1 (Thiamine): <6 nmol/L — ABNORMAL LOW (ref 8–30)

## 2022-08-31 LAB — METHYLMALONIC ACID, SERUM: Methylmalonic Acid, Quant: 116 nmol/L (ref 87–318)

## 2022-08-31 LAB — IMMUNOFIXATION ELECTROPHORESIS
IgG (Immunoglobin G), Serum: 1580 mg/dL (ref 600–1640)
IgM, Serum: 141 mg/dL (ref 50–300)
Immunoglobulin A: 590 mg/dL — ABNORMAL HIGH (ref 47–310)

## 2022-08-31 LAB — CALCIUM, IONIZED: Calcium, Ion: 4.6 mg/dL — ABNORMAL LOW (ref 4.7–5.5)

## 2022-08-31 LAB — PARATHYROID HORMONE, INTACT (NO CA): PTH: 41 pg/mL (ref 16–77)

## 2022-09-02 ENCOUNTER — Encounter: Payer: Self-pay | Admitting: Neurology

## 2022-09-27 ENCOUNTER — Telehealth: Payer: Self-pay | Admitting: Neurology

## 2022-09-27 DIAGNOSIS — E1142 Type 2 diabetes mellitus with diabetic polyneuropathy: Secondary | ICD-10-CM

## 2022-09-27 DIAGNOSIS — M79604 Pain in right leg: Secondary | ICD-10-CM

## 2022-09-27 DIAGNOSIS — M79605 Pain in left leg: Secondary | ICD-10-CM

## 2022-09-27 DIAGNOSIS — R209 Unspecified disturbances of skin sensation: Secondary | ICD-10-CM

## 2022-09-27 DIAGNOSIS — G621 Alcoholic polyneuropathy: Secondary | ICD-10-CM

## 2022-09-27 DIAGNOSIS — E519 Thiamine deficiency, unspecified: Secondary | ICD-10-CM

## 2022-09-27 MED ORDER — THIAMINE HCL 100 MG PO TABS: 100.0000 mg | ORAL_TABLET | Freq: Every day | ORAL | 3 refills | Status: DC

## 2022-09-27 MED ORDER — GABAPENTIN 300 MG PO CAPS: 300.0000 mg | ORAL_CAPSULE | Freq: Three times a day (TID) | ORAL | 5 refills | Status: DC

## 2022-09-27 NOTE — Telephone Encounter (Signed)
Patient has previously tried gabapentin. He took it for 5 months. It makes him sleepy and did not help. We discussed other options including Cymbalta, but patient preferred to try gabapentin again.  I ordered gabapentin 300 mg TID. I recommended he start with once at night for a few days and slowly increase to TID as tolerated. I also ordered B1 100 mg as patient never got it. He also never tried lidocaine cream, which I recommended again.  He has an EMG on Monday, which he is aware of.  All questions were answered.  Kai Levins, MD [facility]

## 2022-09-30 ENCOUNTER — Ambulatory Visit (INDEPENDENT_AMBULATORY_CARE_PROVIDER_SITE_OTHER): Payer: Self-pay | Admitting: Neurology

## 2022-09-30 DIAGNOSIS — M79605 Pain in left leg: Secondary | ICD-10-CM

## 2022-09-30 DIAGNOSIS — R209 Unspecified disturbances of skin sensation: Secondary | ICD-10-CM

## 2022-09-30 DIAGNOSIS — M79604 Pain in right leg: Secondary | ICD-10-CM

## 2022-09-30 DIAGNOSIS — G621 Alcoholic polyneuropathy: Secondary | ICD-10-CM

## 2022-09-30 DIAGNOSIS — E1142 Type 2 diabetes mellitus with diabetic polyneuropathy: Secondary | ICD-10-CM

## 2022-09-30 NOTE — Procedures (Signed)
[facility]  [place], [address]  [place], [place] [address] Tel: [phone] Fax: [phone] Test Date:  09/30/2022  Patient: Shane Holmes DOB: 1986-07-29 Physician: Kai Levins, MD  Sex: Male Height: 5\' 11"  Ref Phys: Kai Levins, MD  ID#: [id number]   Technician:    History: This is a 36 year old male with leg pain, spasms, and weakness.  NCV & EMG Findings: Extensive electrodiagnostic evaluation of the right lower limb with additional nerve conduction studies and needle examination of the right upper limb shows: Right sural and superficial fibular sensory responses are absent. Right median sensory response shows a prolonged distal peak latency (3.6 ms) and reduced amplitude (14 V). Right ulnar and radial sensory responses are within normal limits. Right fibular (EDB) motor response is absent. Right tibial (AH) motor response shows reduced amplitude (3.1 mV) at the ankle and no response at the popliteal fossa. Right fibular (TA), median (APB), and ulnar (ADM) motor responses are within normal limits. Right tibial (AH) F wave is absent. Right H reflex is absent. Right ulnar (ADM) F wave shows a normal latency. Chronic motor axon loss changes with associated active denervation changes are seen in the right tibialis anterior and medial head of gastrocnemius muscles. No motor units with significant active denervation changes are seen in the right extensor digitorum brevis muscle. All other tested muscles show motor unit configuration and recruitment patterns that are within normal limits. Of note, patient was unable to tolerate full needle examination of the right upper extremity.  Impression: This is an abnormal electrodiagnostic evaluation. The findings are most consistent with the following: Evidence of a generalized sensorimotor polyneuropathy, axon loss in type, severe in degree electrically, as evidenced by ongoing/active denervation in the distal muscles of the  lower limb. No electrodiagnostic evidence of a right lumbosacral (L3-S1) motor radiculopathy.    ___________________________ Kai Levins, MD    Nerve Conduction Studies Motor Nerve Results    Latency Amplitude F-Lat Segment Distance CV Comment  Site (ms) Norm (mV) Norm (ms)  (cm) (m/s) Norm   Right Fibular (EDB) Motor  Ankle *NR  < 5.5 *NR  > 3.0        Bel fib head *NR - *NR -  Bel fib head-Ankle - *NR  > 40   Pop fossa *NR - *NR -  Pop fossa-Bel fib head - *NR -   Right Fibular (TA) Motor  Fib head 2.7  < 4.0 5.6  > 4.0        Pop fossa 4.5  < 6.7 5.3 -  Pop fossa-Fib head 10 56  > 40   Right Median (APB) Motor  Wrist 3.2  < 3.9 11.1  > 6.0        Elbow 8.4 - 9.5 -  Elbow-Wrist - -  > 50   Right Tibial (AH) Motor  Ankle 5.2  < 6.0 *3.1  > 8.0 NS       Knee *NR - *NR -  Knee-Ankle - *NR  > 40   Right Ulnar (ADM) Motor  Wrist 2.1  < 3.1 11.1  > 7.0 34.0        Sensory Sites    Neg Peak Lat Amplitude (O-P) Segment Distance Velocity Comment  Site (ms) Norm (V) Norm  (cm) (ms)   Right Median Sensory  Wrist-Dig II *3.6  < 3.4 *14  > 20 Wrist-Dig II 13    Right Radial Sensory  Forearm-Wrist 2.1  < 2.7 18  >  18 Forearm-Wrist 10    Right Superficial Fibular Sensory  14 cm-Ankle *NR  < 4.5 *NR  > 5 14 cm-Ankle 14    Right Sural Sensory  Calf *NR  < 4.5 *NR  > 5 Calf-Lat mall 14    Right Ulnar Sensory  Wrist-Dig V 2.7  < 3.1 23  > 12 Wrist-Dig V 11     H-Reflex Results    M-Lat H Lat H Neg Amp H-M Lat  Site (ms) (ms) Norm (mV) (ms)  Right Tibial H-Reflex  Pop fossa 6.2 -  < 35.0 - -   Electromyography   Side Muscle Ins.Act Fibs Fasc Recrt Amp Dur Poly Activation Comment  Right Tib ant *1+ Nml Nml *1- *1+ *1+ Nml Nml N/A  Right EDB Nml *4+ Nml *None *- *- *- *None N/A  Right Rectus fem Nml Nml Nml Nml Nml Nml Nml Nml N/A  Right FDI Nml Nml Nml Nml Nml Nml Nml Nml N/A  Right Gluteus med Nml Nml Nml Nml Nml Nml Nml Nml N/A  Right Gastroc MH Nml *1+ Nml *1- *1+ *1+  *1+ Nml N/A      Waveforms:  Motor             Sensory             F-Wave      H-Reflex

## 2022-10-01 ENCOUNTER — Encounter: Payer: Self-pay | Admitting: Neurology

## 2022-10-15 ENCOUNTER — Other Ambulatory Visit: Payer: Self-pay

## 2022-10-15 DIAGNOSIS — M79605 Pain in left leg: Secondary | ICD-10-CM

## 2022-10-15 DIAGNOSIS — G621 Alcoholic polyneuropathy: Secondary | ICD-10-CM

## 2022-10-15 DIAGNOSIS — E1142 Type 2 diabetes mellitus with diabetic polyneuropathy: Secondary | ICD-10-CM

## 2022-10-17 ENCOUNTER — Telehealth: Payer: Self-pay

## 2022-10-17 DIAGNOSIS — M79605 Pain in left leg: Secondary | ICD-10-CM

## 2022-10-17 NOTE — Telephone Encounter (Signed)
-----   Message from Odis Hollingshead, CMA sent at 10/17/2022 12:43 PM EST ----- Regarding: FW: Pain management referral  ----- Message ----- From: Antony Madura, MD Sent: 10/15/2022   9:23 AM EST To: Lenise Herald, LPN Subject: Pain management referral                       Renee,  Mr. Stender would like to be referred to pain management for pain in bilateral legs. He is not satisfied with my neuropathic pain medications and would like to discuss other options with them. You can put that in the referral.  Thank you,  Jacquelyne Balint

## 2022-10-21 ENCOUNTER — Encounter (HOSPITAL_COMMUNITY): Payer: Self-pay

## 2022-10-21 ENCOUNTER — Other Ambulatory Visit: Payer: Self-pay

## 2022-10-21 ENCOUNTER — Telehealth: Payer: Self-pay

## 2022-10-21 ENCOUNTER — Emergency Department (HOSPITAL_COMMUNITY): Payer: Self-pay

## 2022-10-21 ENCOUNTER — Inpatient Hospital Stay (HOSPITAL_COMMUNITY)
Admission: EM | Admit: 2022-10-21 | Discharge: 2022-10-23 | DRG: 378 | Disposition: A | Discharge status: Home / Self Care | Payer: Self-pay | Attending: Internal Medicine | Admitting: Internal Medicine

## 2022-10-21 DIAGNOSIS — Z7984 Long term (current) use of oral hypoglycemic drugs: Secondary | ICD-10-CM

## 2022-10-21 DIAGNOSIS — F17210 Nicotine dependence, cigarettes, uncomplicated: Secondary | ICD-10-CM | POA: Diagnosis present

## 2022-10-21 DIAGNOSIS — D6959 Other secondary thrombocytopenia: Secondary | ICD-10-CM | POA: Diagnosis present

## 2022-10-21 DIAGNOSIS — E876 Hypokalemia: Secondary | ICD-10-CM | POA: Diagnosis present

## 2022-10-21 DIAGNOSIS — Z8616 Personal history of COVID-19: Secondary | ICD-10-CM

## 2022-10-21 DIAGNOSIS — D689 Coagulation defect, unspecified: Secondary | ICD-10-CM | POA: Diagnosis present

## 2022-10-21 DIAGNOSIS — J45909 Unspecified asthma, uncomplicated: Secondary | ICD-10-CM | POA: Diagnosis present

## 2022-10-21 DIAGNOSIS — Z8711 Personal history of peptic ulcer disease: Secondary | ICD-10-CM

## 2022-10-21 DIAGNOSIS — F1020 Alcohol dependence, uncomplicated: Secondary | ICD-10-CM | POA: Diagnosis present

## 2022-10-21 DIAGNOSIS — E1165 Type 2 diabetes mellitus with hyperglycemia: Secondary | ICD-10-CM | POA: Insufficient documentation

## 2022-10-21 DIAGNOSIS — T471X6A Underdosing of other antacids and anti-gastric-secretion drugs, initial encounter: Secondary | ICD-10-CM | POA: Diagnosis present

## 2022-10-21 DIAGNOSIS — Z91128 Patient's intentional underdosing of medication regimen for other reason: Secondary | ICD-10-CM

## 2022-10-21 DIAGNOSIS — Z79899 Other long term (current) drug therapy: Secondary | ICD-10-CM

## 2022-10-21 DIAGNOSIS — Y901 Blood alcohol level of 20-39 mg/100 ml: Secondary | ICD-10-CM | POA: Diagnosis present

## 2022-10-21 DIAGNOSIS — G621 Alcoholic polyneuropathy: Secondary | ICD-10-CM | POA: Diagnosis present

## 2022-10-21 DIAGNOSIS — Z884 Allergy status to anesthetic agent status: Secondary | ICD-10-CM

## 2022-10-21 DIAGNOSIS — K922 Gastrointestinal hemorrhage, unspecified: Secondary | ICD-10-CM | POA: Diagnosis present

## 2022-10-21 DIAGNOSIS — D509 Iron deficiency anemia, unspecified: Secondary | ICD-10-CM | POA: Diagnosis present

## 2022-10-21 DIAGNOSIS — Z8719 Personal history of other diseases of the digestive system: Secondary | ICD-10-CM

## 2022-10-21 DIAGNOSIS — Z833 Family history of diabetes mellitus: Secondary | ICD-10-CM

## 2022-10-21 DIAGNOSIS — K2921 Alcoholic gastritis with bleeding: Principal | ICD-10-CM | POA: Diagnosis present

## 2022-10-21 DIAGNOSIS — K920 Hematemesis: Principal | ICD-10-CM | POA: Diagnosis present

## 2022-10-21 DIAGNOSIS — K760 Fatty (change of) liver, not elsewhere classified: Secondary | ICD-10-CM | POA: Diagnosis present

## 2022-10-21 DIAGNOSIS — F1010 Alcohol abuse, uncomplicated: Secondary | ICD-10-CM | POA: Diagnosis present

## 2022-10-21 LAB — URINALYSIS, ROUTINE W REFLEX MICROSCOPIC
Bacteria, UA: NONE SEEN
Bilirubin Urine: NEGATIVE
Glucose, UA: >=500 mg/dL — AB
Hgb urine dipstick: NEGATIVE
Ketones, ur: 20 mg/dL — AB
Leukocytes,Ua: NEGATIVE
Nitrite: NEGATIVE
Protein, ur: 30 mg/dL — AB
Specific Gravity, Urine: 1.025 (ref 1.005–1.030)
pH: 9 — ABNORMAL HIGH (ref 5.0–8.0)

## 2022-10-21 LAB — CBC
HCT: 45.3 % (ref 39.0–52.0)
Hemoglobin: 15.1 g/dL (ref 13.0–17.0)
MCH: 28.4 pg (ref 26.0–34.0)
MCHC: 33.3 g/dL (ref 30.0–36.0)
MCV: 85.2 fL (ref 80.0–100.0)
Platelets: 203 10*3/uL (ref 150–400)
RBC: 5.32 MIL/uL (ref 4.22–5.81)
RDW: 15.5 % (ref 11.5–15.5)
WBC: 10.4 10*3/uL (ref 4.0–10.5)
nRBC: 0 % (ref 0.0–0.2)

## 2022-10-21 LAB — COMPREHENSIVE METABOLIC PANEL
ALT: 24 U/L (ref 0–44)
AST: 54 U/L — ABNORMAL HIGH (ref 15–41)
Albumin: 4.3 g/dL (ref 3.5–5.0)
Alkaline Phosphatase: 92 U/L (ref 38–126)
Anion gap: 20 — ABNORMAL HIGH (ref 5–15)
BUN: 5 mg/dL — ABNORMAL LOW (ref 6–20)
CO2: 25 mmol/L (ref 22–32)
Calcium: 9.2 mg/dL (ref 8.9–10.3)
Chloride: 95 mmol/L — ABNORMAL LOW (ref 98–111)
Creatinine, Ser: 0.78 mg/dL (ref 0.61–1.24)
GFR, Estimated: >60 mL/min (ref >60)
Glucose, Bld: 302 mg/dL — ABNORMAL HIGH (ref 70–99)
Potassium: 3.2 mmol/L — ABNORMAL LOW (ref 3.5–5.1)
Sodium: 140 mmol/L (ref 135–145)
Total Bilirubin: 1.3 mg/dL — ABNORMAL HIGH (ref 0.3–1.2)
Total Protein: 8.2 g/dL — ABNORMAL HIGH (ref 6.5–8.1)

## 2022-10-21 LAB — ETHANOL: Alcohol, Ethyl (B): 23 mg/dL — ABNORMAL HIGH (ref <10)

## 2022-10-21 LAB — TYPE AND SCREEN
ABO/RH(D): O POS
Antibody Screen: NEGATIVE

## 2022-10-21 LAB — CBG MONITORING, ED: Glucose-Capillary: 301 mg/dL — ABNORMAL HIGH (ref 70–99)

## 2022-10-21 LAB — PROTIME-INR
INR: 1.3 — ABNORMAL HIGH (ref 0.8–1.2)
Prothrombin Time: 15.7 seconds — ABNORMAL HIGH (ref 11.4–15.2)

## 2022-10-21 LAB — LIPASE, BLOOD: Lipase: 39 U/L (ref 11–51)

## 2022-10-21 MED ORDER — MORPHINE SULFATE (PF) 2 MG/ML IV SOLN
2.0000 mg | Freq: Once | INTRAVENOUS | Status: AC
  Administered 2022-10-21: 2 mg via INTRAVENOUS
  Filled 2022-10-21: qty 1

## 2022-10-21 MED ORDER — FENTANYL CITRATE PF 50 MCG/ML IJ SOSY
50.0000 ug | PREFILLED_SYRINGE | Freq: Once | INTRAMUSCULAR | Status: DC
  Filled 2022-10-21: qty 1

## 2022-10-21 MED ORDER — ONDANSETRON 4 MG PO TBDP: 4.0000 mg | ORAL_TABLET | Freq: Once | ORAL | Status: AC

## 2022-10-21 MED ORDER — ONDANSETRON 4 MG PO TBDP
ORAL_TABLET | ORAL | Status: AC
  Administered 2022-10-21: 4 mg via ORAL
  Filled 2022-10-21: qty 1

## 2022-10-21 MED ORDER — PANTOPRAZOLE SODIUM 40 MG IV SOLR
40.0000 mg | Freq: Once | INTRAVENOUS | Status: AC
  Administered 2022-10-21: 40 mg via INTRAVENOUS
  Filled 2022-10-21: qty 10

## 2022-10-21 MED ORDER — FENTANYL CITRATE PF 50 MCG/ML IJ SOSY
50.0000 ug | PREFILLED_SYRINGE | Freq: Once | INTRAMUSCULAR | Status: AC
  Administered 2022-10-21: 50 ug via INTRAVENOUS

## 2022-10-21 MED ORDER — HYDROMORPHONE HCL 1 MG/ML IJ SOLN
0.5000 mg | Freq: Once | INTRAMUSCULAR | Status: AC
  Administered 2022-10-21: 0.5 mg via INTRAVENOUS
  Filled 2022-10-21: qty 1

## 2022-10-21 MED ORDER — NICOTINE 21 MG/24HR TD PT24
21.0000 mg | MEDICATED_PATCH | Freq: Every day | TRANSDERMAL | Status: DC
  Administered 2022-10-21 – 2022-10-23 (×3): 21 mg via TRANSDERMAL
  Filled 2022-10-21 (×4): qty 1

## 2022-10-21 MED ORDER — ALUM & MAG HYDROXIDE-SIMETH 200-200-20 MG/5ML PO SUSP
30.0000 mL | Freq: Once | ORAL | Status: AC
  Administered 2022-10-21: 30 mL via ORAL
  Filled 2022-10-21: qty 30

## 2022-10-21 NOTE — Telephone Encounter (Signed)
Order was sent out on 11/16

## 2022-10-21 NOTE — ED Notes (Signed)
 Fentanyl given. Unable to verify in [facility] due to IT issues.

## 2022-10-21 NOTE — ED Provider Notes (Signed)
[facility] EMERGENCY DEPARTMENT Provider Note   CSN: [id number] Arrival date & time: 10/21/22  1735     History  Chief Complaint  Patient presents with   Hematemesis    Shane Holmes is a 36 y.o. male.  HPI Patient presents with nausea and vomiting.  Vomiting since this morning.  No diarrhea.  He states he has had some blood in the emesis.  Has pain.  Feels that it could be his ulcers.  Reviewed records and has here history of ulcers, esophagitis, gastritis and grade 1 esophageal varices.  Does drink somewhat heavily but is cut back to only about 4 drinks a day.  No diarrhea.   Past Medical History:  Diagnosis Date   Alcohol abuse    Asthma    Back pain    Bulging lumbar disc    COVID    01/2021   Encounter for lumbar puncture    Hematochezia    Hypokalemia    Hyponatremia    Lower GI bleed    Neuropathy     Home Medications Prior to Admission medications   Medication Sig Start Date End Date Taking? Authorizing Provider  albuterol (VENTOLIN HFA) 108 (90 Base) MCG/ACT inhaler INHALE 2 PUFFS INTO THE LUNGS EVERY SIX HOURS AS NEEDED FOR WHEEZING OR SHORTNESS OF BREATH. 01/26/21 08/27/22  Ghimire, Henreitta Leber, MD  folic acid (FOLVITE) 1 MG tablet Take 1 tablet (1 mg total) by mouth daily. 01/26/21   Ghimire, Henreitta Leber, MD  gabapentin (NEURONTIN) 300 MG capsule Take 1 capsule (300 mg total) by mouth 3 (three) times daily. 09/27/22   Shellia Carwin, MD  metFORMIN (GLUCOPHAGE) 500 MG tablet Take 1 tablet (500 mg total) by mouth 2 (two) times daily with a meal. Patient not taking: Reported on 06/19/2022 05/23/22 06/22/22  Truddie Hidden, MD  Multiple Vitamin (MULTIVITAMIN WITH MINERALS) TABS tablet Take 1 tablet by mouth daily. 01/27/21   Ghimire, Henreitta Leber, MD  pantoprazole (PROTONIX) 40 MG tablet Take 1 tablet (40 mg total) by mouth 2 (two) times daily. Patient not taking: Reported on 08/27/2022 06/20/22   Nita Sells, MD  thiamine (VITAMIN B1) 100 MG  tablet Take 1 tablet (100 mg total) by mouth daily. 09/27/22   Shellia Carwin, MD  dicyclomine (BENTYL) 20 MG tablet Take 1 tablet (20 mg total) by mouth 2 (two) times daily. Patient not taking: Reported on 11/02/2019 03/02/14 11/03/19  Margarita Mail, PA-C      Allergies    Lidocaine-glycerin    Review of Systems   Review of Systems  Physical Exam Updated Vital Signs BP (!) 130/92   Pulse 98   Temp 98.3 F (36.8 C) (Oral)   Resp 12   Ht 5\' 11"  (1.803 m)   Wt 90.3 kg   SpO2 93%   BMI 27.75 kg/m  Physical Exam Vitals and nursing note reviewed.  Constitutional:      Comments: Patient appears uncomfortable  HENT:     Head: Atraumatic.  Cardiovascular:     Rate and Rhythm: Tachycardia present.  Abdominal:     Tenderness: There is abdominal tenderness.     Comments: Epigastric tenderness without rebound or guarding.  Patient is actively vomiting.  Musculoskeletal:        General: No tenderness.  Skin:    General: Skin is warm.     Capillary Refill: Capillary refill takes less than 2 seconds.  Neurological:     Mental Status: He is alert and  oriented to person, place, and time.     ED Results / Procedures / Treatments   Labs (all labs ordered are listed, but only abnormal results are displayed) Labs Reviewed  COMPREHENSIVE METABOLIC PANEL - Abnormal; Notable for the following components:      Result Value   Potassium 3.2 (*)    Chloride 95 (*)    Glucose, Bld 302 (*)    BUN 5 (*)    Total Protein 8.2 (*)    AST 54 (*)    Total Bilirubin 1.3 (*)    Anion gap 20 (*)    All other components within normal limits  ETHANOL - Abnormal; Notable for the following components:   Alcohol, Ethyl (B) 23 (*)    All other components within normal limits  URINALYSIS, ROUTINE W REFLEX MICROSCOPIC - Abnormal; Notable for the following components:   pH 9.0 (*)    Glucose, UA >=500 (*)    Ketones, ur 20 (*)    Protein, ur 30 (*)    All other components within normal limits   PROTIME-INR - Abnormal; Notable for the following components:   Prothrombin Time 15.7 (*)    INR 1.3 (*)    All other components within normal limits  CBG MONITORING, ED - Abnormal; Notable for the following components:   Glucose-Capillary 301 (*)    All other components within normal limits  CBC  LIPASE, BLOOD  POC OCCULT BLOOD, ED  TYPE AND SCREEN    EKG None  Radiology DG Abdomen Acute W/Chest  Result Date: 10/21/2022 CLINICAL DATA:  Abdominal pain, vomiting blood. History of stomach ulcer. EXAM: DG ABDOMEN ACUTE WITH 1 VIEW CHEST COMPARISON:  01/22/2021. FINDINGS: There is no evidence of dilated bowel loops or free intraperitoneal air. Relative paucity of bowel gas is noted with gas present in the colon. No radiopaque calculi or other significant radiographic abnormality is seen. Heart size and mediastinal contours are within normal limits. Both lungs are clear. Old rib fractures are present on the right. IMPRESSION: 1. No evidence of bowel obstruction or free air. 2. No acute cardiopulmonary process. . Electronically Signed   By: Brett Fairy M.D.   On: 10/21/2022 20:09    Procedures Procedures    Medications Ordered in ED Medications  nicotine (NICODERM CQ - dosed in mg/24 hours) patch 21 mg (21 mg Transdermal Patch Applied 10/21/22 2053)  ondansetron (ZOFRAN-ODT) disintegrating tablet 4 mg (4 mg Oral Given 10/21/22 1809)  pantoprazole (PROTONIX) injection 40 mg (40 mg Intravenous Given 10/21/22 1839)  fentaNYL (SUBLIMAZE) injection 50 mcg (50 mcg Intravenous Given by Other 10/21/22 1839)  alum & mag hydroxide-simeth (MAALOX/MYLANTA) 200-200-20 MG/5ML suspension 30 mL (30 mLs Oral Given 10/21/22 2034)  HYDROmorphone (DILAUDID) injection 0.5 mg (0.5 mg Intravenous Given 10/21/22 2033)    ED Course/ Medical Decision Making/ A&P                           Medical Decision Making Amount and/or Complexity of Data Reviewed Labs: ordered. Radiology: ordered.  Risk OTC  drugs. Prescription drug management.   Patient presents with nausea vomiting and abdominal pain.  Had some blood in the emesis.  History of gastritis esophagitis and small varices.  Has previously required transfusions.  Started vomiting today.  Differential diagnosis includes esophagitis less likely variceal bleed.  Also gastritis.  Ulcer was considered.  We will start with basic blood work and get x-ray to evaluate for perforated ulcer.  Will give some IV Protonix antiemetics and some pain medicine.  Patient is continued pain.  Lab work is overall reassuring.  INR mildly elevated but likely due to his liver disease.  Hemoglobin and white count reassuring.  Sugars mildly elevated.  I think with the previous history of bleed requiring enough for transfusion would benefit for admission for monitoring.  We will also alert GI.        Final Clinical Impression(s) / ED Diagnoses Final diagnoses:  Hematemesis with nausea    Rx / DC Orders ED Discharge Orders     None         Benjiman Core, MD 10/21/22 2156

## 2022-10-21 NOTE — Telephone Encounter (Signed)
-----   Message from Antony Madura, MD sent at 10/15/2022  9:22 AM EST ----- Regarding: Pain management referral Jo Booze,  Mr. Buddenhagen would like to be referred to pain management for pain in bilateral legs. He is not satisfied with my neuropathic pain medications and would like to discuss other options with them. You can put that in the referral.  Thank you,  Jacquelyne Balint

## 2022-10-21 NOTE — ED Triage Notes (Addendum)
Reports since 8am vomiting and reports blood.  Reports hx of stomach ulcer Also complains of left arm numbness. Complains that his esophagus and stomach is on fire.  Coffee ground emesis with bright red blood noted in vomit.  Hx of stomach ulcers esophagus ulcers gastric erosion grade 1 esophageal varices and barrett's esophagus. Patient is a known alcoholic an has cut back to 4 beers a day.  Patient was dx with DM in July but never prescribed meds per family and CBG in triage 301

## 2022-10-22 DIAGNOSIS — R112 Nausea with vomiting, unspecified: Secondary | ICD-10-CM

## 2022-10-22 DIAGNOSIS — K920 Hematemesis: Secondary | ICD-10-CM

## 2022-10-22 DIAGNOSIS — F1010 Alcohol abuse, uncomplicated: Secondary | ICD-10-CM

## 2022-10-22 DIAGNOSIS — E1165 Type 2 diabetes mellitus with hyperglycemia: Secondary | ICD-10-CM

## 2022-10-22 DIAGNOSIS — E876 Hypokalemia: Secondary | ICD-10-CM

## 2022-10-22 LAB — BASIC METABOLIC PANEL
Anion gap: 14 (ref 5–15)
Anion gap: 10 (ref 5–15)
BUN: 7 mg/dL (ref 6–20)
BUN: 8 mg/dL (ref 6–20)
CO2: 29 mmol/L (ref 22–32)
CO2: 26 mmol/L (ref 22–32)
Calcium: 8.5 mg/dL — ABNORMAL LOW (ref 8.9–10.3)
Calcium: 6.8 mg/dL — ABNORMAL LOW (ref 8.9–10.3)
Chloride: 96 mmol/L — ABNORMAL LOW (ref 98–111)
Chloride: 103 mmol/L (ref 98–111)
Creatinine, Ser: 0.75 mg/dL (ref 0.61–1.24)
Creatinine, Ser: 0.61 mg/dL (ref 0.61–1.24)
GFR, Estimated: >60 mL/min (ref >60)
GFR, Estimated: >60 mL/min (ref >60)
Glucose, Bld: 211 mg/dL — ABNORMAL HIGH (ref 70–99)
Glucose, Bld: 155 mg/dL — ABNORMAL HIGH (ref 70–99)
Potassium: 3.3 mmol/L — ABNORMAL LOW (ref 3.5–5.1)
Potassium: 2.4 mmol/L — CL (ref 3.5–5.1)
Sodium: 139 mmol/L (ref 135–145)
Sodium: 139 mmol/L (ref 135–145)

## 2022-10-22 LAB — CBC
HCT: 39.2 % (ref 39.0–52.0)
Hemoglobin: 13.5 g/dL (ref 13.0–17.0)
MCH: 29 pg (ref 26.0–34.0)
MCHC: 34.4 g/dL (ref 30.0–36.0)
MCV: 84.1 fL (ref 80.0–100.0)
Platelets: 107 10*3/uL — ABNORMAL LOW (ref 150–400)
RBC: 4.66 MIL/uL (ref 4.22–5.81)
RDW: 15.8 % — ABNORMAL HIGH (ref 11.5–15.5)
WBC: 8.1 10*3/uL (ref 4.0–10.5)
nRBC: 0 % (ref 0.0–0.2)

## 2022-10-22 LAB — CBG MONITORING, ED
Glucose-Capillary: 292 mg/dL — ABNORMAL HIGH (ref 70–99)
Glucose-Capillary: 190 mg/dL — ABNORMAL HIGH (ref 70–99)
Glucose-Capillary: 200 mg/dL — ABNORMAL HIGH (ref 70–99)

## 2022-10-22 LAB — MAGNESIUM
Magnesium: 1.1 mg/dL — ABNORMAL LOW (ref 1.7–2.4)
Magnesium: 2 mg/dL (ref 1.7–2.4)

## 2022-10-22 LAB — HIV ANTIBODY (ROUTINE TESTING W REFLEX): HIV Screen 4th Generation wRfx: NONREACTIVE

## 2022-10-22 LAB — PHOSPHORUS: Phosphorus: 5 mg/dL — ABNORMAL HIGH (ref 2.5–4.6)

## 2022-10-22 LAB — GLUCOSE, CAPILLARY: Glucose-Capillary: 249 mg/dL — ABNORMAL HIGH (ref 70–99)

## 2022-10-22 MED ORDER — CALCIUM GLUCONATE-NACL 1-0.675 GM/50ML-% IV SOLN
1.0000 g | Freq: Once | INTRAVENOUS | Status: AC
  Administered 2022-10-22: 1000 mg via INTRAVENOUS
  Filled 2022-10-22: qty 50

## 2022-10-22 MED ORDER — LORAZEPAM 1 MG PO TABS: 1.0000 mg | ORAL_TABLET | ORAL | Status: DC | PRN

## 2022-10-22 MED ORDER — THIAMINE MONONITRATE 100 MG PO TABS
100.0000 mg | ORAL_TABLET | Freq: Every day | ORAL | Status: DC
  Administered 2022-10-22: 100 mg via ORAL
  Filled 2022-10-22 (×2): qty 1

## 2022-10-22 MED ORDER — ALBUTEROL SULFATE (2.5 MG/3ML) 0.083% IN NEBU: 2.5000 mg | INHALATION_SOLUTION | Freq: Four times a day (QID) | RESPIRATORY_TRACT | Status: DC | PRN

## 2022-10-22 MED ORDER — FOLIC ACID 1 MG PO TABS
1.0000 mg | ORAL_TABLET | Freq: Every day | ORAL | Status: DC
  Administered 2022-10-22 – 2022-10-23 (×2): 1 mg via ORAL
  Filled 2022-10-22 (×2): qty 1

## 2022-10-22 MED ORDER — MAGNESIUM SULFATE 4 GM/100ML IV SOLN
4.0000 g | Freq: Once | INTRAVENOUS | Status: AC
  Administered 2022-10-22: 4 g via INTRAVENOUS
  Filled 2022-10-22: qty 100

## 2022-10-22 MED ORDER — PANTOPRAZOLE SODIUM 40 MG IV SOLR
40.0000 mg | Freq: Two times a day (BID) | INTRAVENOUS | Status: DC
  Administered 2022-10-22 – 2022-10-23 (×3): 40 mg via INTRAVENOUS
  Filled 2022-10-22 (×3): qty 10

## 2022-10-22 MED ORDER — POTASSIUM CHLORIDE 10 MEQ/100ML IV SOLN
10.0000 meq | INTRAVENOUS | Status: AC
  Administered 2022-10-22 (×4): 10 meq via INTRAVENOUS
  Filled 2022-10-22 (×4): qty 100

## 2022-10-22 MED ORDER — POTASSIUM CHLORIDE 10 MEQ/100ML IV SOLN
10.0000 meq | INTRAVENOUS | Status: AC
  Administered 2022-10-22 – 2022-10-23 (×4): 10 meq via INTRAVENOUS
  Filled 2022-10-22 (×4): qty 100

## 2022-10-22 MED ORDER — INSULIN ASPART 100 UNIT/ML IJ SOLN
0.0000 [IU] | INTRAMUSCULAR | Status: DC
  Administered 2022-10-22: 5 [IU] via SUBCUTANEOUS
  Administered 2022-10-22 (×2): 3 [IU] via SUBCUTANEOUS
  Administered 2022-10-23 (×2): 2 [IU] via SUBCUTANEOUS
  Administered 2022-10-23: 5 [IU] via SUBCUTANEOUS
  Administered 2022-10-23: 3 [IU] via SUBCUTANEOUS

## 2022-10-22 MED ORDER — POTASSIUM CHLORIDE CRYS ER 20 MEQ PO TBCR
40.0000 meq | EXTENDED_RELEASE_TABLET | Freq: Four times a day (QID) | ORAL | Status: AC
  Administered 2022-10-22 – 2022-10-23 (×2): 40 meq via ORAL
  Filled 2022-10-22 (×2): qty 2

## 2022-10-22 MED ORDER — ONDANSETRON HCL 4 MG/2ML IJ SOLN: 4.0000 mg | Freq: Four times a day (QID) | INTRAMUSCULAR | Status: DC | PRN

## 2022-10-22 MED ORDER — ADULT MULTIVITAMIN W/MINERALS CH
1.0000 | ORAL_TABLET | Freq: Every day | ORAL | Status: DC
  Administered 2022-10-22 – 2022-10-23 (×2): 1 via ORAL
  Filled 2022-10-22 (×3): qty 1

## 2022-10-22 MED ORDER — INSULIN ASPART 100 UNIT/ML IJ SOLN
0.0000 [IU] | Freq: Three times a day (TID) | INTRAMUSCULAR | Status: DC
  Administered 2022-10-22: 8 [IU] via SUBCUTANEOUS

## 2022-10-22 MED ORDER — HYDROMORPHONE HCL 1 MG/ML IJ SOLN
0.5000 mg | INTRAMUSCULAR | Status: DC | PRN
  Administered 2022-10-22 – 2022-10-23 (×6): 0.5 mg via INTRAVENOUS
  Filled 2022-10-22 (×6): qty 1

## 2022-10-22 MED ORDER — LORAZEPAM 2 MG/ML IJ SOLN: 1.0000 mg | INTRAMUSCULAR | Status: DC | PRN

## 2022-10-22 MED ORDER — HYDROMORPHONE HCL 1 MG/ML IJ SOLN
0.5000 mg | INTRAMUSCULAR | Status: DC | PRN
  Administered 2022-10-22 (×2): 0.5 mg via INTRAVENOUS
  Filled 2022-10-22 (×2): qty 1

## 2022-10-22 MED ORDER — INSULIN GLARGINE-YFGN 100 UNIT/ML ~~LOC~~ SOLN
5.0000 [IU] | Freq: Every day | SUBCUTANEOUS | Status: DC
  Administered 2022-10-22 – 2022-10-23 (×2): 5 [IU] via SUBCUTANEOUS
  Filled 2022-10-22 (×2): qty 0.05

## 2022-10-22 MED ORDER — MORPHINE SULFATE (PF) 2 MG/ML IV SOLN
2.0000 mg | INTRAVENOUS | Status: DC | PRN
  Administered 2022-10-22 (×2): 2 mg via INTRAVENOUS
  Filled 2022-10-22 (×2): qty 1

## 2022-10-22 MED ORDER — THIAMINE HCL 100 MG/ML IJ SOLN
100.0000 mg | Freq: Every day | INTRAMUSCULAR | Status: DC
  Administered 2022-10-23: 100 mg via INTRAVENOUS
  Filled 2022-10-22 (×2): qty 2

## 2022-10-22 MED ORDER — NICOTINE 21 MG/24HR TD PT24: 21.0000 mg | MEDICATED_PATCH | Freq: Every day | TRANSDERMAL | Status: DC

## 2022-10-22 NOTE — Assessment & Plan Note (Addendum)
Patient reports that he has been trying to cut down.  Currently at 4 drinks of 16 ounce beer daily. -ETOH level of 24. No signs of active withdrawal

## 2022-10-22 NOTE — Assessment & Plan Note (Signed)
Patient with history of hematemesis in the past.  Last EGD in 06/19/2022 with esophageal mucosal changes suspicious for Barrett's esophagus.  Had 1 superficial esophageal ulcer without bleeding.  Had erosive gastropathy without bleeding.  Multiple nonbleeding duodenal ulcers.  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  Portal hypertensive gastropathy. -Hemoglobin remained stable at 15 however he continues to be symptomatic with hematemesis -Start IV Protonix twice daily -[facility] was notified by EDP and will keep n.p.o. after midnight for anticipated endoscopy in the morning

## 2022-10-22 NOTE — Progress Notes (Addendum)
PROGRESS NOTE   Shane Holmes  ION:[id number]    DOB: [birth year]    DOA: 10/21/2022  PCP: Patient, No Pcp Per   I have briefly reviewed patients previous medical records in [facility].  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  This was associated with significant throat pain.  No melena.  Admitted for acute upper GI bleed.  Hemoglobin and hemodynamically stable.  [facility] consulted and input pending.   Assessment & Plan:  Principal Problem:   Hematemesis Active Problems:   Hypokalemia   Alcohol abuse   Uncontrolled type 2 diabetes mellitus with hyperglycemia, without long-term current use of insulin (HCC)   Intractable nausea, vomiting and acute upper GI bleed, recurrent: Patient with ongoing alcohol use.  Wide DD: MW tear, esophagitis, gastritis, ulcer disease versus others.  Hemodynamically stable.  Hemoglobin stable.  Continue clear liquids, IVF and IV PPI twice daily pending GI input.  As needed antiemetics.  Last emesis at 2 AM, no blood or coffee-ground noted.  EGD versus advance diet as tolerated.?  Sucralfate.  Chest and abdominal legs on admission without free air or acute process.  Prior EGD results of 06/19/2022 as noted above.  Hypokalemia: Replace and  follow.  Replace hypomagnesemia.  Hypomagnesemia: Magnesium 1.1.  Replace and follow this evening.  Thrombocytopenia: Likely due to alcohol.  Follow CBCs.  Alcohol dependence: Patient reports that he is being trying to cut down alcohol intake.  He reports now down to 4 drinks of 16 ounce beers daily.  Alcohol level 24 on admission.  Monitor closely for alcohol withdrawal, high risk.  CIWA protocol.  Uncontrolled type II DM with hyperglycemia: Hold home dose of metformin.  A1c 7.4 on 06/19/2022.  CBGs in the 292-301 range since admission.  Will add low-dose Semglee 5 units daily and continue NovoLog SSI.  Titrate insulins as needed.  Asthma: Stable.  Tobacco abuse: Cessation counseled.  Continue nicotine patch.  Body mass index is 27.75 kg/m.   DVT prophylaxis: SCDs Start: 10/22/22 0050     Code Status: Full Code:  Family Communication: None at bedside Disposition:  Status is: Observation The patient will require care spanning > 2 midnights and should be moved to inpatient because: Care expected to cross 30 hours, his medical intensity based on recent intractable nausea, vomiting and upper GI bleed, ongoing IV fluids, IV PPI and will need slow advancement of diet.     Consultants:   [facility]  Procedures:     Antimicrobials:      Subjective:  Seen this morning while still in ED.  Last emesis at around 2 AM, yellowish/bilious liquid without blood or coffee-ground material.  Throat pain better, feels like stabbing sensation when attempts to drink something.  Normal colored BM yesterday.  Last alcohol consumption on morning of admission.  Objective:   Vitals:   10/22/22 0730 10/22/22 0816 10/22/22 0830 10/22/22 0930  BP: (!) 131/99  110/74 (!) 132/91  Pulse: 79  75 78  Resp: 13  12 13   Temp:  97.7 F (36.5 C)    TempSrc:  Oral    SpO2: 97%  93% 96%  Weight:      Height:        General exam: Young male, moderately built and nourished lying comfortably propped up in  bed without distress.  Oral mucosa with borderline hydration. Respiratory system: Clear to auscultation. Respiratory effort normal. Cardiovascular system: S1 & S2 heard, RRR. No JVD, murmurs, rubs, gallops or clicks. No pedal edema.  Telemetry personally reviewed: Sinus rhythm. Gastrointestinal system: Abdomen is nondistended, soft.  Mild epigastric tenderness without rigidity, guarding or rebound. No organomegaly or masses felt. Normal bowel sounds heard. Central nervous system: Alert and oriented. No focal neurological deficits. Extremities: Symmetric 5 x 5 power. Skin: No rashes, lesions or ulcers.  Tattoos on sternum and precordium. Psychiatry: Judgement and insight appear normal. Mood & affect appropriate.     Data Reviewed:   I have personally reviewed following labs and imaging studies   CBC: Recent Labs  Lab 10/21/22 1809 10/22/22 0500  WBC 10.4 8.1  HGB 15.1 13.5  HCT 45.3 39.2  MCV 85.2 84.1  PLT 203 107*    Basic Metabolic Panel: Recent Labs  Lab 10/21/22 1809 10/22/22 0500  NA 140 139  K 3.2* 3.3*  CL 95* 96*  CO2 25 29  GLUCOSE 302* 211*  BUN 5* 7  CREATININE 0.78 0.75  CALCIUM 9.2 8.5*  MG  --  1.1*  PHOS  --  5.0*    Liver Function Tests: Recent Labs  Lab 10/21/22 1809  AST 54*  ALT 24  ALKPHOS 92  BILITOT 1.3*  PROT 8.2*  ALBUMIN 4.3    CBG: Recent Labs  Lab 10/21/22 1812 10/22/22 0758  GLUCAP 301* 292*    Microbiology Studies:  No results found for this or any previous visit (from the past 240 hour(s)).  Radiology Studies:  DG Abdomen Acute W/Chest  Result Date: 10/21/2022 CLINICAL DATA:  Abdominal pain, vomiting blood. History of stomach ulcer. EXAM: DG ABDOMEN ACUTE WITH 1 VIEW CHEST COMPARISON:  01/22/2021. FINDINGS: There is no evidence of dilated bowel loops or free intraperitoneal air. Relative paucity of bowel gas is noted with gas present in the colon. No radiopaque calculi or other significant radiographic abnormality is  seen. Heart size and mediastinal contours are within normal limits. Both lungs are clear. Old rib fractures are present on the right. IMPRESSION: 1. No evidence of bowel obstruction or free air. 2. No acute cardiopulmonary process. . Electronically Signed   By: 01/24/2021 M.D.   On: 10/21/2022 20:09    Scheduled Meds:    insulin aspart  0-15 Units Subcutaneous TID PC & HS   nicotine  21 mg Transdermal Daily   pantoprazole (PROTONIX) IV  40 mg Intravenous Q12H    Continuous Infusions:     LOS: 0 days     10/23/2022, MD,  FACP, FHM, SFHM, [facility], [facility]   [facility] [place]     To contact the attending provider between 7A-7P or the covering provider during after hours 7P-7A, please log into the web site [email or web address] and access using universal [place] password for that web site. If you do not have the password, please call the hospital operator.  10/22/2022, 11:30 AM

## 2022-10-22 NOTE — Assessment & Plan Note (Signed)
Replete with IV potassium 

## 2022-10-22 NOTE — H&P (Addendum)
History and Physical    Patient: Shane Holmes QIW:[id number] DOB: 11/09/1986 DOA: 10/21/2022 DOS: the patient was seen and examined on 10/22/2022 PCP: Patient, No Pcp Per  Patient coming from: Home  Chief Complaint:  Chief Complaint  Patient presents with   Hematemesis   HPI: Shane Holmes is a 36 y.o. male with medical history significant of T2DM, asthma, ETOH abuse, Barrett's esophagus, gastric/duodenal and esophageal ulcer who presents with hematemesis.  Patient reports ongoing nausea and vomiting with intermittent hematemesis without clots.  Has stabbing sensation of his throat when swallowing.  He feels pain going from his mid sternum down to his mid abdomen.  Denies any recent history of melena.  He does not take any PPIs at home.  Has been attempting to cut down his drinking to 4 beers daily.  Has 1 pack/day tobacco use.  In the ED, temperature 98.3, BP of 145/107.  Hemoglobin stable at 15.  Sodium of 140, K of 3.2, creatinine of 0.78 with BUN of 5.  Anion gap of 20. CBG of 302.  EtOH level 23.  Abdominal x-ray showed no evidence of bowel obstruction or free air.  EDP has messaged [facility] to consult in the morning. Hospitalist then consulted for admission. Review of Systems: As mentioned in the history of present illness. All other systems reviewed and are negative. Past Medical History:  Diagnosis Date   Alcohol abuse    Asthma    Back pain    Bulging lumbar disc    COVID    01/2021   Encounter for lumbar puncture    Hematochezia    Hypokalemia    Hyponatremia    Lower GI bleed    Neuropathy    Past Surgical History:  Procedure Laterality Date   BIOPSY  06/19/2022   Procedure: BIOPSY;  Surgeon: Sherrilyn Rist, MD;  Location: [facility];  Service: Gastroenterology;;   ESOPHAGOGASTRODUODENOSCOPY (EGD) WITH PROPOFOL N/A 06/19/2022   Procedure: ESOPHAGOGASTRODUODENOSCOPY (EGD) WITH PROPOFOL;  Surgeon: Sherrilyn Rist, MD;  Location: [facility] ENDOSCOPY;   Service: Gastroenterology;  Laterality: N/A;   Social History:  reports that he has been smoking cigarettes. He has never used smokeless tobacco. He reports current alcohol use of about 6.0 standard drinks of alcohol per week. He reports current drug use. Drug: Marijuana.  Allergies  Allergen Reactions   Lidocaine-Glycerin Other (See Comments)    Redness to application site    Family History  Problem Relation Age of Onset   Cirrhosis Mother    Cirrhosis Father    Cancer Maternal Grandfather    Cancer Paternal Grandfather    Diabetes Maternal Aunt    Hypertension Neg Hx     Prior to Admission medications   Medication Sig Start Date End Date Taking? Authorizing Provider  albuterol (VENTOLIN HFA) 108 (90 Base) MCG/ACT inhaler INHALE 2 PUFFS INTO THE LUNGS EVERY SIX HOURS AS NEEDED FOR WHEEZING OR SHORTNESS OF BREATH. 01/26/21 08/27/22  Ghimire, Werner Lean, MD  folic acid (FOLVITE) 1 MG tablet Take 1 tablet (1 mg total) by mouth daily. 01/26/21   Ghimire, Werner Lean, MD  gabapentin (NEURONTIN) 300 MG capsule Take 1 capsule (300 mg total) by mouth 3 (three) times daily. 09/27/22   Antony Madura, MD  metFORMIN (GLUCOPHAGE) 500 MG tablet Take 1 tablet (500 mg total) by mouth 2 (two) times daily with a meal. Patient not taking: Reported on 06/19/2022 05/23/22 06/22/22  Pollyann Savoy, MD  Multiple Vitamin (MULTIVITAMIN WITH  MINERALS) TABS tablet Take 1 tablet by mouth daily. 01/27/21   Ghimire, Werner Lean, MD  pantoprazole (PROTONIX) 40 MG tablet Take 1 tablet (40 mg total) by mouth 2 (two) times daily. Patient not taking: Reported on 08/27/2022 06/20/22   Rhetta Mura, MD  thiamine (VITAMIN B1) 100 MG tablet Take 1 tablet (100 mg total) by mouth daily. 09/27/22   Antony Madura, MD  dicyclomine (BENTYL) 20 MG tablet Take 1 tablet (20 mg total) by mouth 2 (two) times daily. Patient not taking: Reported on 11/02/2019 03/02/14 11/03/19  [name], PA-C    Physical Exam: Vitals:    10/21/22 2130 10/21/22 2300 10/21/22 2329 10/22/22 0000  BP: (!) 130/92 (!) 144/108  (!) 152/107  Pulse: 98 95  92  Resp: 12 18  13   Temp:   98.2 F (36.8 C)   TempSrc:   Oral   SpO2: 93% 98%  98%  Weight:      Height:       Constitutional: NAD, calm, comfortable, ill-appearing male lying in bed with active vomiting Eyes: lids and conjunctivae normal ENMT: Mucous membranes are moist. Neck: normal, supple Respiratory: clear to auscultation bilaterally, no wheezing, no crackles. Normal respiratory effort. No accessory muscle use.  Cardiovascular: Regular rate and rhythm, no murmurs / rubs / gallops. No extremity edema.   Abdomen:soft, diffuse tender without any guarding, rigidity or rebound tenderness.,  Bowel sounds positive.  Musculoskeletal: no clubbing / cyanosis. No joint deformity upper and lower extremities. Good ROM, no contractures. Normal muscle tone.  Skin: no rashes, lesions, ulcers. No induration Neurologic: CN 2-12 grossly intact. Strength 5/5 in all 4.  Psychiatric: Normal judgment and insight. Alert and oriented x 3. Normal mood. Data Reviewed:  See HPI  Assessment and Plan: * Hematemesis Patient with history of hematemesis in the past.  Last EGD in 06/19/2022 with esophageal mucosal changes suspicious for Barrett's esophagus.  Had 1 superficial esophageal ulcer without bleeding.  Had erosive gastropathy without bleeding.  Multiple nonbleeding duodenal ulcers.  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  Portal hypertensive gastropathy. -Hemoglobin remained stable at 15 however he continues to be symptomatic with hematemesis -Start IV Protonix twice daily -[facility] was notified by EDP and will keep n.p.o. after midnight for anticipated endoscopy in the morning  Uncontrolled type 2 diabetes mellitus with hyperglycemia, without long-term current use of insulin (HCC) Uncontrolled with hyperglycemia.  CBG of 302. - Keep on moderate sliding scale  insulin  Alcohol abuse Patient reports that he has been trying to cut down.  Currently at 4 drinks of 16 ounce beer daily. -ETOH level of 24. No signs of active withdrawal  Hypokalemia Replete with IV potassium      Advance Care Planning:   Code Status: Full Code full  Consults: [facility]  Family Communication: None at bedside  Severity of Illness: The appropriate patient status for this patient is OBSERVATION. Observation status is judged to be reasonable and necessary in order to provide the required intensity of service to ensure the patient's safety. [name] presenting symptoms, physical exam findings, and initial radiographic and laboratory data in the context of their medical condition is felt to place them at decreased risk for further clinical deterioration. Furthermore, it is anticipated that the patient will be medically stable for discharge from the hospital within 2 midnights of admission.   Author: 06/21/2022, DO 10/22/2022 12:58 AM  For on call review [email or web address].

## 2022-10-22 NOTE — Assessment & Plan Note (Signed)
Uncontrolled with hyperglycemia.  CBG of 302. - Keep on moderate sliding scale insulin

## 2022-10-22 NOTE — Consult Note (Addendum)
[facility] Consult: 10:14 AM 10/22/2022  LOS: 0 days    Referring Provider: Dr  Algis Liming  Primary Care Physician:  None Primary Gastroenterologist:  unassigned. [facility] as inpt.       Reason for Consultation:  CGE and dark emesis.  Odynophagia.     HPI: Shane Holmes is a 36 y.o. male.  Hx DM2.  Asthma.  ETOH use do.  L varicocele.  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  Back, leg pain and neuropathy.   Thiamine, iron deficiency.  Thrombocytopenia.   Fatty liver per CT/ultrasound, elevated LFTs 2020.  01/2021 labs: SMA negative, mitochondrial Ab negative, HCV nonreactive, HBV surface Ab reactive, HBV surface Ag nonreactive, HBV core Ab reactive, Ceruloplasmin normal, IgG normal, ANA negative.  Hematemesis in 01/2022: resolved.  Recurrent 06/2022. 06/2022 EGD:  Grade 1 esophageal varices, Not source of bleeding.  No beta-blocker prophylaxis needed. Gastric erosions, duodenal ulcers, esophageal ulcer.  Any or all of these may have been the bleeding sources.  All ulcers clean-based with no stigmata, no endoscopic therapy required.  Gastric path: antral hyperemia, slight chronic chronic inflammation, no H. pylori.  Long segment of Barrett's appearing esophagus, path: gastric mucosa w fibrosis, hyperemia, no intestinal metaplasia or dysplasia.    Discharge on Protonix 40 bid in July.   Never got the prescription, ? sent to the wrong pharmacy?  Additionally not taking metformin, was never told he was a diabetic just that he had high blood sugars per his recall.  Never fup w GI.    Starting 8 AM yesterday had onset of brown emesis that progressed to light pink-tinged then dark/CGE.  Occurred several times yesterday.  Waited for his girlfriend get home and arrived in the ED around 1 PM.  Emesis continued.  His last  episode was around 2 AM today at which point the vomit was yellow-tinged, clear and no blood or coffee grounds.  Simultaneously developed burning in his esophagus chest and mid abdominal pain.  The esophageal burning is particularly exacerbated with p.o. intake.  This has subsided as has the abdominal pain.  Currently tolerating small volumes of clear liquids.  Last bowel movements were yesterday morning, he had a couple of brown stools. Patient says up until yesterday morning his stomach, esophagus or giving him no problems he was not having heartburn, anorexia, nausea, abdominal pain. Drinking 5 sixteen ounce "mikes hard lemonade" (malt liquor/ABV 5%)/day (volume represents "cutting back"), 1ppd smoker.  No aspirin, no NSAIDs.  BP 145/107, HR max 107.  Excellent sats on RA.  No fevers.   T bili 1.3.  alk phos 92.  AST/ALT 54/24.   K 3.3, mag 1.1: low.  Na, BUN/creat ok.   Hgb 13.5 (9.5 in July).  MCV 84.  Platelets 107 (112 in July) INR 1.3 (previously normal).   ETOH 23.    Both parents were alcoholics.  His father died with cirrhosis of the liver and his mother died with hepatitis of unclear type. Lives with his girlfriend.  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 Longest period of sobriety was 3 months after admission in February 2022.    Past Medical History:  Diagnosis Date   Alcohol abuse    Asthma    Back pain    Bulging lumbar disc    COVID    01/2021   Encounter for lumbar puncture    Hematochezia    Hypokalemia    Hyponatremia    Lower GI bleed    Neuropathy     Past Surgical History:  Procedure Laterality Date   BIOPSY  06/19/2022   Procedure: BIOPSY;  Surgeon: Doran Stabler, MD;  Location: [facility];  Service: Gastroenterology;;   ESOPHAGOGASTRODUODENOSCOPY (EGD) WITH PROPOFOL N/A 06/19/2022   Procedure: ESOPHAGOGASTRODUODENOSCOPY (EGD) WITH PROPOFOL;  Surgeon: Doran Stabler, MD;  Location: [facility];  Service: Gastroenterology;  Laterality: N/A;    Prior to Admission medications   Medication Sig Start Date End Date Taking? Authorizing Provider  albuterol (VENTOLIN HFA) 108 (90 Base) MCG/ACT inhaler INHALE 2 PUFFS INTO THE LUNGS EVERY SIX HOURS AS NEEDED FOR WHEEZING OR SHORTNESS OF BREATH. 01/26/21 08/27/22  Ghimire, Henreitta Leber, MD  folic acid (FOLVITE) 1 MG tablet Take 1 tablet (1 mg total) by mouth daily. 01/26/21   Ghimire, Henreitta Leber, MD  gabapentin (NEURONTIN) 300 MG capsule Take 1 capsule (300 mg total) by mouth 3 (three) times daily. 09/27/22   Shellia Carwin, MD  metFORMIN (GLUCOPHAGE) 500 MG tablet Take 1 tablet (500 mg total) by mouth 2 (two) times daily with a meal. Patient not taking: Reported on 06/19/2022 05/23/22 06/22/22  Truddie Hidden, MD  Multiple Vitamin (MULTIVITAMIN WITH MINERALS) TABS tablet Take 1 tablet by mouth daily. 01/27/21   Ghimire, Henreitta Leber, MD  pantoprazole (PROTONIX) 40 MG tablet Take 1 tablet (40 mg total) by mouth 2 (two) times daily. Patient not taking: Reported on 08/27/2022 06/20/22   Nita Sells, MD  thiamine (VITAMIN B1) 100 MG tablet Take 1 tablet (100 mg total) by mouth daily. 09/27/22   Shellia Carwin, MD  dicyclomine (BENTYL) 20 MG tablet Take 1 tablet (20 mg total) by mouth 2 (two) times daily. Patient not taking: Reported on 11/02/2019 03/02/14 11/03/19  Margarita Mail, PA-C    Scheduled Meds:  insulin aspart  0-15 Units Subcutaneous TID PC & HS   nicotine  21 mg Transdermal Daily   pantoprazole (PROTONIX) IV  40 mg Intravenous Q12H   Infusions:  potassium chloride 10 mEq (10/22/22 0930)   PRN Meds: HYDROmorphone (DILAUDID) injection, morphine injection   Allergies as of 10/21/2022 - Review Complete 10/21/2022  Allergen Reaction Noted   Lidocaine-glycerin Other (See Comments) 02/01/2021    Family History  Problem Relation Age of Onset   Cirrhosis Mother    Cirrhosis Father    Cancer Maternal Grandfather    Cancer  Paternal Grandfather    Diabetes Maternal Aunt    Hypertension Neg Hx     Social History   Socioeconomic History   Marital status: Legally Separated    Spouse name: Not on file   Number of children: 2   Years of education: Not on file   Highest education level: Not on file  Occupational History   Not on file  Tobacco Use   Smoking status: Some Days    Types: Cigarettes   Smokeless tobacco: Never   Tobacco comments:    Close to a pack at day  Vaping Use   Vaping Use:  Never used  Substance and Sexual Activity   Alcohol use: Yes    Alcohol/week: 6.0 standard drinks of alcohol    Types: 6 Cans of beer per week    Comment: 4 beers a day   Drug use: Yes    Types: Marijuana    Comment: cocaine positive 01/2022/ CbD   Sexual activity: Yes  Other Topics Concern   Not on file  Social History Narrative   Right handed   Caffeine 1 can a day   Live in one level home   Social Determinants of Health   Financial Resource Strain: Low Risk  (02/01/2021)   Overall Financial Resource Strain (CARDIA)    Difficulty of Paying Living Expenses: Not hard at all  Food Insecurity: No Food Insecurity (02/01/2021)   Hunger Vital Sign    Worried About Running Out of Food in the Last Year: Never true    Ran Out of Food in the Last Year: Never true  Transportation Needs: No Transportation Needs (02/01/2021)   PRAPARE - Hydrologist (Medical): No    Lack of Transportation (Non-Medical): No  Physical Activity: Not on file  Stress: Not on file  Social Connections: Not on file  Intimate Partner Violence: Not At Risk (02/01/2021)   Humiliation, Afraid, Rape, and Kick questionnaire    Fear of Current or Ex-Partner: No    Emotionally Abused: No    Physically Abused: No    Sexually Abused: No    REVIEW OF SYSTEMS: Constitutional: Feels fatigued.  Normally this is not an issue. ENT:  No nose bleeds.  Used to have partial dentures which broke.  Pulm: Has a chronic,  intermittent, nonproductive cough. CV:  No palpitations, no LE edema.  Burning/stabbing esophageal/chest pain with p.o. intake.  No angina. GU:  No hematuria, no frequency GI: Per HPI. Heme: Other than the dark and coffee-ground emesis, no unusual bleeding or bruising. Transfusions: None Neuro: Chronic numbness and painful tingling and pain to palpation in his feet which also feels swollen but are not visibly swollen. Derm:  No itching, no rash or sores.  Endocrine:  No sweats or chills.  No polyuria or dysuria Immunization: No vaccinations in records. Travel:  None beyond local counties in last few months.    PHYSICAL EXAM: Vital signs in last 24 hours: Vitals:   10/22/22 0830 10/22/22 0930  BP: 110/74 (!) 132/91  Pulse: 75 78  Resp: 12 13  Temp:    SpO2: 93% 96%   Wt Readings from Last 3 Encounters:  10/21/22 90.3 kg  08/27/22 90.3 kg  06/20/22 88.9 kg    General: Patient looks unwell but not toxic.  He is alert.  Mildly uncomfortable.  Able to provide good history. Head: No signs of head trauma.  No facial asymmetry or swelling. Eyes: Conjunctiva pink.  No icterus.  EOMI. Ears: No hearing deficit Nose: No congestion or discharge Mouth: Oral mucosa is moist, pink, clear.  Minor erythema in the uvula.  Very few remaining teeth.  Tongue midline. Neck: No JVD Lungs: Clear bilaterally without labored breathing.  Slight cough with exam Heart: RRR.  No MRG.  S1, S2 present Abdomen: Soft.  Nonfocal mid and lower midline tenderness without guarding or rebound.  No distention.  Active bowel sounds.  No HSM, masses, hernias, bruits..   Rectal: Deferred Musc/Skeltl: No joint redness, swelling or gross deformity. Extremities: No CCE. Neurologic: Moves all 4 limbs.  No tremors.  No gross deficits but  formal strength testing not pursued.  Feet are tender to light touch Skin: No telangiectasia. Tattoos: Several professional quality tattoos on his back, front and arms. Nodes: No  cervical adenopathy Psych: Cooperative.  Fluid speech.  No agitation  Intake/Output from previous day: No intake/output data recorded. Intake/Output this shift: No intake/output data recorded.  LAB RESULTS: Recent Labs    10/21/22 1809 10/22/22 0500  WBC 10.4 8.1  HGB 15.1 13.5  HCT 45.3 39.2  PLT 203 107*   BMET Lab Results  Component Value Date   NA 139 10/22/2022   NA 140 10/21/2022   NA 136 06/20/2022   K 3.3 (L) 10/22/2022   K 3.2 (L) 10/21/2022   K 4.2 06/20/2022   CL 96 (L) 10/22/2022   CL 95 (L) 10/21/2022   CL 102 06/20/2022   CO2 29 10/22/2022   CO2 25 10/21/2022   CO2 25 06/20/2022   GLUCOSE 211 (H) 10/22/2022   GLUCOSE 302 (H) 10/21/2022   GLUCOSE 132 (H) 06/20/2022   BUN 7 10/22/2022   BUN 5 (L) 10/21/2022   BUN 6 06/20/2022   CREATININE 0.75 10/22/2022   CREATININE 0.78 10/21/2022   CREATININE 0.53 (L) 06/20/2022   CALCIUM 8.5 (L) 10/22/2022   CALCIUM 9.2 10/21/2022   CALCIUM 8.1 (L) 06/20/2022   LFT Recent Labs    10/21/22 1809  PROT 8.2*  ALBUMIN 4.3  AST 54*  ALT 24  ALKPHOS 92  BILITOT 1.3*   PT/INR Lab Results  Component Value Date   INR 1.3 (H) 10/21/2022   INR 1.2 06/19/2022   INR 1.2 06/18/2022   Hepatitis Panel No results for input(s): "HEPBSAG", "HCVAB", "HEPAIGM", "HEPBIGM" in the last 72 hours. C-Diff No components found for: "CDIFF" Lipase     Component Value Date/Time   LIPASE 39 10/21/2022 1809    Drugs of Abuse     Component Value Date/Time   LABOPIA NONE DETECTED 06/07/2022 0401   COCAINSCRNUR NONE DETECTED 06/07/2022 0401   LABBENZ NONE DETECTED 06/07/2022 0401   AMPHETMU NONE DETECTED 06/07/2022 0401   THCU NONE DETECTED 06/07/2022 0401   LABBARB NONE DETECTED 06/07/2022 0401     RADIOLOGY STUDIES: DG Abdomen Acute W/Chest  Result Date: 10/21/2022 CLINICAL DATA:  Abdominal pain, vomiting blood. History of stomach ulcer. EXAM: DG ABDOMEN ACUTE WITH 1 VIEW CHEST COMPARISON:  01/22/2021. FINDINGS:  There is no evidence of dilated bowel loops or free intraperitoneal air. Relative paucity of bowel gas is noted with gas present in the colon. No radiopaque calculi or other significant radiographic abnormality is seen. Heart size and mediastinal contours are within normal limits. Both lungs are clear. Old rib fractures are present on the right. IMPRESSION: 1. No evidence of bowel obstruction or free air. 2. No acute cardiopulmonary process. . Electronically Signed   By: Brett Fairy M.D.   On: 10/21/2022 20:09     IMPRESSION:     Hematemesis EGD 4 m ago w non bleeding small esoph varices and esoph, gastric, duod ulcers though not bleeding, were felt to be likely source of bleeding.  Pt non compliant w PPI.      Hypokalemia, hypomagnesemia.  Received IV supplementation.      ETOH use disorder, dependence.  Not in remission.  Patient counseled and aware he needs to quit alcohol altogether.    Thrombocytopenia.      Coagulopathy.      Thiamine and iron deficient per labs 2 m ago in late September.  T2 DM.      Polyneuropathy.  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   Neuroconduction tests 09/30/22 : "Abnormal electrodiagnostic evaluation. Findings most consistent with the following: Evidence of a generalized sensorimotor polyneuropathy, axon loss in type, severe in degree electrically, as evidenced by ongoing/active denervation in the distal muscles of the lower limb. No electrodiagnostic evidence of a right lumbosacral (L3-S1) motor radiculopathy.  PLAN:     Given:  1) EGD just 4 months ago revealing small/nonbleeding varices and ulcers of the esophagus, stomach and duodenum 2) the fact that he has not been taking PPI, 3) last episode of emesis produced clear,  nonbloody,non-coffee-ground material at 2 AM today 4) associated esophageal/chest burning is  improving  Not clear EGD is indicated.  He is currently on clear liquids and tolerating small amounts of these.  If EGD planned, need to n.p.o. for 3 hours in setting of clear liquid consumption  For now continue the Protonix 40 IV every 12 hours but should be able to convert to 40 PO bid soon.  Complete abstinence from all alcohol.  Suggested he involve himself with AA.  ?  Does he qualify for any outpatient rehab/sobriety support services?   Azucena Freed  10/22/2022, 10:14 AM Phone [phone]  I have taken an interval history, thoroughly reviewed the chart and examined the patient. I agree with the Advanced Practitioner's note, impression and recommendations, and have recorded additional findings, impressions and recommendations below. I performed a substantive portion of this encounter (>50% time spent), including a complete performance of the medical decision making.  My additional thoughts are as follows:  This man is known to me from a July hospitalization in similar circumstances.  He denies having had chronic vomiting since then, then became acutely ill yesterday with multiple episodes of vomiting progressed to coffee-ground emesis.  Hemoglobin remains normal, vital signs normal.  He is not passing melena or having further hematemesis or vomiting since arrival. Odynophagia from multiple episodes of vomiting yesterday, probably causing acute esophagitis.  Alcohol use has cut down from when we last saw him but still several beers per day. This is a contributing factor to his vomiting.  He also did not seem to be aware of his diabetes diagnosis from the last admission and has been on no medicines with admission glucose of 302.  This is also likely contributing to the vomiting.  Small-volume coffee-ground emesis, stability as noted above, no plans for EGD at present.  Recommend alcohol abstinence, as needed antiemetic, glucose control and overnight observation.  Twice daily oral  PPI for 4 weeks.  Advance diet as tolerated, he can go home from my perspective when tolerating a regular diet and see me in the office as needed.  Nelida Meuse III [phone]

## 2022-10-23 DIAGNOSIS — K2920 Alcoholic gastritis without bleeding: Secondary | ICD-10-CM

## 2022-10-23 LAB — CBC
HCT: 37.2 % — ABNORMAL LOW (ref 39.0–52.0)
Hemoglobin: 12.6 g/dL — ABNORMAL LOW (ref 13.0–17.0)
MCH: 28.8 pg (ref 26.0–34.0)
MCHC: 33.9 g/dL (ref 30.0–36.0)
MCV: 85.1 fL (ref 80.0–100.0)
Platelets: 91 10*3/uL — ABNORMAL LOW (ref 150–400)
RBC: 4.37 MIL/uL (ref 4.22–5.81)
RDW: 15.5 % (ref 11.5–15.5)
WBC: 5.5 10*3/uL (ref 4.0–10.5)
nRBC: 0 % (ref 0.0–0.2)

## 2022-10-23 LAB — COMPREHENSIVE METABOLIC PANEL
ALT: 19 U/L (ref 0–44)
AST: 52 U/L — ABNORMAL HIGH (ref 15–41)
Albumin: 3.6 g/dL (ref 3.5–5.0)
Alkaline Phosphatase: 65 U/L (ref 38–126)
Anion gap: 11 (ref 5–15)
BUN: 9 mg/dL (ref 6–20)
CO2: 24 mmol/L (ref 22–32)
Calcium: 8.2 mg/dL — ABNORMAL LOW (ref 8.9–10.3)
Chloride: 97 mmol/L — ABNORMAL LOW (ref 98–111)
Creatinine, Ser: 0.69 mg/dL (ref 0.61–1.24)
GFR, Estimated: >60 mL/min (ref >60)
Glucose, Bld: 161 mg/dL — ABNORMAL HIGH (ref 70–99)
Potassium: 4.2 mmol/L (ref 3.5–5.1)
Sodium: 132 mmol/L — ABNORMAL LOW (ref 135–145)
Total Bilirubin: 2.2 mg/dL — ABNORMAL HIGH (ref 0.3–1.2)
Total Protein: 7.1 g/dL (ref 6.5–8.1)

## 2022-10-23 LAB — GLUCOSE, CAPILLARY
Glucose-Capillary: 133 mg/dL — ABNORMAL HIGH (ref 70–99)
Glucose-Capillary: 147 mg/dL — ABNORMAL HIGH (ref 70–99)
Glucose-Capillary: 169 mg/dL — ABNORMAL HIGH (ref 70–99)
Glucose-Capillary: 204 mg/dL — ABNORMAL HIGH (ref 70–99)

## 2022-10-23 LAB — MAGNESIUM: Magnesium: 1.7 mg/dL (ref 1.7–2.4)

## 2022-10-23 LAB — PHOSPHORUS: Phosphorus: 3.3 mg/dL (ref 2.5–4.6)

## 2022-10-23 MED ORDER — METFORMIN HCL 500 MG PO TABS: 500.0000 mg | ORAL_TABLET | Freq: Two times a day (BID) | ORAL | 2 refills | Status: DC

## 2022-10-23 MED ORDER — OXYCODONE HCL 5 MG PO CAPS: 5.0000 mg | ORAL_CAPSULE | Freq: Four times a day (QID) | ORAL | 0 refills | Status: AC | PRN

## 2022-10-23 MED ORDER — MAGNESIUM SULFATE IN D5W 1-5 GM/100ML-% IV SOLN
1.0000 g | Freq: Once | INTRAVENOUS | Status: AC
  Administered 2022-10-23: 1 g via INTRAVENOUS
  Filled 2022-10-23: qty 100

## 2022-10-23 MED ORDER — HYDROMORPHONE HCL 1 MG/ML IJ SOLN
0.5000 mg | Freq: Once | INTRAMUSCULAR | Status: AC
  Administered 2022-10-23: 0.5 mg via INTRAVENOUS
  Filled 2022-10-23: qty 1

## 2022-10-23 MED ORDER — PANTOPRAZOLE SODIUM 40 MG PO TBEC: 40.0000 mg | DELAYED_RELEASE_TABLET | Freq: Two times a day (BID) | ORAL | 3 refills | Status: DC

## 2022-10-23 NOTE — Discharge Summary (Signed)
Physician Discharge Summary  Shane Holmes [id number] DOB: 05-30-1986 DOA: 10/21/2022  PCP: Patient, No Pcp Per  Admit date: 10/21/2022 Discharge date: 10/23/2022  Admitted From: Home Disposition: Home  Recommendations for Outpatient Follow-up:  Follow up with PCP in 1-2 weeks Follow-up with GI as previously scheduled  Home Health: N/A Equipment/Devices: N/A  Discharge Condition: Stable CODE STATUS: Full code Diet recommendation: Low-salt, low-carb diet.  Absolute alcohol cessation.  Discharge summary: 36 year old gentleman with history of recently diagnosed type 2 diabetes with A1c 7.4, ongoing alcohol use neuropathy, fatty liver presented to ER with acute onset of brown emesis that progressed to light pink-tinged than dark coffee-ground emesis along with epigastric pain so presented to the ER.  In the emergency room, hemodynamically stable.  No NSAID use.  Blood pressure stable.  LFTs are stable.  Creatinine was normal.  Admitted with significant epigastric pain thought secondary to alcoholic gastritis.  Seen and examined by gastroenterology.  Recent upper GI endoscopy.  Clinically stabilized.  Epigastric abdominal pain with nausea: Suspect alcohol induced gastritis.  Some clinical improvement.  Nausea vomiting improved.  Tolerating soft diet with minimal abdominal pain.  Bowel function is normal.  Wants to go home. Plan: Soft and liquid diet.  Protonix 40 mg p.o. twice daily.  Short course of oxycodone was prescribed.  Absolute abstinence from alcohol advised.  Electrolytes are adequate.  Recently diagnosed diabetes and prescribed metformin, A1c 7.4.  However patient is not taking any medications.  Prescriptions of metformin was sent.  Does have mild abdominal pain but feels overall comfortable and wants to go home.  Discharged.     Discharge Diagnoses:  Principal Problem:   Hematemesis Active Problems:   Hypokalemia   Alcohol abuse   Acute upper GI bleed   Uncontrolled  type 2 diabetes mellitus with hyperglycemia, without long-term current use of insulin [facility])    Discharge Instructions  Discharge Instructions     Call MD for:  persistant nausea and vomiting   Complete by: As directed    Call MD for:  severe uncontrolled pain   Complete by: As directed    Diet Carb Modified   Complete by: As directed    Increase activity slowly   Complete by: As directed       Allergies as of 10/23/2022       Reactions   Lidocaine-glycerin Other (See Comments)   Redness to application site        Medication List     STOP taking these medications    multivitamin with minerals Tabs tablet       TAKE these medications    albuterol 108 (90 Base) MCG/ACT inhaler Commonly known as: VENTOLIN HFA INHALE 2 PUFFS INTO THE LUNGS EVERY SIX HOURS AS NEEDED FOR WHEEZING OR SHORTNESS OF BREATH.   folic acid 1 MG tablet Commonly known as: FOLVITE Take 1 tablet (1 mg total) by mouth daily.   gabapentin 300 MG capsule Commonly known as: NEURONTIN Take 1 capsule (300 mg total) by mouth 3 (three) times daily.   metFORMIN 500 MG tablet Commonly known as: GLUCOPHAGE Take 1 tablet (500 mg total) by mouth 2 (two) times daily with a meal.   oxycodone 5 MG capsule Commonly known as: OXY-IR Take 1 capsule (5 mg total) by mouth every 6 (six) hours as needed for up to 3 days.   pantoprazole 40 MG tablet Commonly known as: PROTONIX Take 1 tablet (40 mg total) by mouth 2 (two) times daily.   thiamine  100 MG tablet Commonly known as: VITAMIN B1 Take 1 tablet (100 mg total) by mouth daily.        Allergies  Allergen Reactions   Lidocaine-Glycerin Other (See Comments)    Redness to application site    Consultations: Gastroenterology   Procedures/Studies: DG Abdomen Acute W/Chest  Result Date: 10/21/2022 CLINICAL DATA:  Abdominal pain, vomiting blood. History of stomach ulcer. EXAM: DG ABDOMEN ACUTE WITH 1 VIEW CHEST COMPARISON:  01/22/2021.  FINDINGS: There is no evidence of dilated bowel loops or free intraperitoneal air. Relative paucity of bowel gas is noted with gas present in the colon. No radiopaque calculi or other significant radiographic abnormality is seen. Heart size and mediastinal contours are within normal limits. Both lungs are clear. Old rib fractures are present on the right. IMPRESSION: 1. No evidence of bowel obstruction or free air. 2. No acute cardiopulmonary process. . Electronically Signed   By: Brett Fairy M.D.   On: 10/21/2022 20:09   NCV with EMG(electromyography)  Result Date: 09/30/2022 Shellia Carwin, MD     09/30/2022  5:04 PM [facility] [place], [place]  [place],  [address] Tel: [phone] Fax: [phone] Test Date:  09/30/2022 Patient: Shane Holmes DOB: January 25, 1986 Physician: Kai Levins, MD Sex: Male Height: 5\' 11"  Ref Phys: Kai Levins, MD ID#: [id number]   Technician:  History: This is a 36 year old male with leg pain, spasms, and weakness. NCV & EMG Findings: Extensive electrodiagnostic evaluation of the right lower limb with additional nerve conduction studies and needle examination of the right upper limb shows: Right sural and superficial fibular sensory responses are absent. Right median sensory response shows a prolonged distal peak latency (3.6 ms) and reduced amplitude (14 V). Right ulnar and radial sensory responses are within normal limits. Right fibular (EDB) motor response is absent. Right tibial (AH) motor response shows reduced amplitude (3.1 mV) at the ankle and no response at the popliteal fossa. Right fibular (TA), median (APB), and ulnar (ADM) motor responses are within normal limits. Right tibial (AH) F wave is absent. Right H reflex is absent. Right ulnar (ADM) F wave shows a normal latency. Chronic motor axon loss changes with associated active denervation changes are seen in the right tibialis anterior and medial head of gastrocnemius muscles. No motor units  with significant active denervation changes are seen in the right extensor digitorum brevis muscle. All other tested muscles show motor unit configuration and recruitment patterns that are within normal limits. Of note, patient was unable to tolerate full needle examination of the right upper extremity. Impression: This is an abnormal electrodiagnostic evaluation. The findings are most consistent with the following: Evidence of a generalized sensorimotor polyneuropathy, axon loss in type, severe in degree electrically, as evidenced by ongoing/active denervation in the distal muscles of the lower limb. No electrodiagnostic evidence of a right lumbosacral (L3-S1) motor radiculopathy. ___________________________ Kai Levins, MD Nerve Conduction Studies Motor Nerve Results   Latency Amplitude F-Lat Segment Distance CV Comment Site (ms) Norm (mV) Norm (ms)  (cm) (m/s) Norm  Right Fibular (EDB) Motor Ankle *NR  < 5.5 *NR  > 3.0       Bel fib head *NR - *NR -  Bel fib head-Ankle - *NR  > 40  Pop fossa *NR - *NR -  Pop fossa-Bel fib head - *NR -  Right Fibular (TA) Motor Fib head 2.7  < 4.0 5.6  > 4.0       Pop fossa  4.5  < 6.7 5.3 -  Pop fossa-Fib head 10 56  > 40  Right Median (APB) Motor Wrist 3.2  < 3.9 11.1  > 6.0       Elbow 8.4 - 9.5 -  Elbow-Wrist - -  > 50  Right Tibial (AH) Motor Ankle 5.2  < 6.0 *3.1  > 8.0 NS      Knee *NR - *NR -  Knee-Ankle - *NR  > 40  Right Ulnar (ADM) Motor Wrist 2.1  < 3.1 11.1  > 7.0 34.0      Sensory Sites   Neg Peak Lat Amplitude (O-P) Segment Distance Velocity Comment Site (ms) Norm (V) Norm  (cm) (ms)  Right Median Sensory Wrist-Dig II *3.6  < 3.4 *14  > 20 Wrist-Dig II 13   Right Radial Sensory Forearm-Wrist 2.1  < 2.7 18  > 18 Forearm-Wrist 10   Right Superficial Fibular Sensory 14 cm-Ankle *NR  < 4.5 *NR  > 5 14 cm-Ankle 14   Right Sural Sensory Calf *NR  < 4.5 *NR  > 5 Calf-Lat mall 14   Right Ulnar Sensory Wrist-Dig V 2.7  < 3.1 23  > 12 Wrist-Dig V 11   H-Reflex Results   M-Lat  H Lat H Neg Amp H-M Lat Site (ms) (ms) Norm (mV) (ms) Right Tibial H-Reflex Pop fossa 6.2 -  < 35.0 - - Electromyography  Side Muscle Ins.Act Fibs Fasc Recrt Amp Dur Poly Activation Comment Right Tib ant *1+ Nml Nml *1- *1+ *1+ Nml Nml N/A Right EDB Nml *4+ Nml *None *- *- *- *None N/A Right Rectus fem Nml Nml Nml Nml Nml Nml Nml Nml N/A Right FDI Nml Nml Nml Nml Nml Nml Nml Nml N/A Right Gluteus med Nml Nml Nml Nml Nml Nml Nml Nml N/A Right Gastroc MH Nml *1+ Nml *1- *1+ *1+ *1+ Nml N/A Waveforms: Motor         Sensory         F-Wave    H-Reflex    (Echo, Carotid, EGD, Colonoscopy, ERCP)    Subjective: Patient seen and examined.  Occasional mild to moderate epigastric pain persist but denies any nausea or vomiting.  He was doing clears.  He wanted to try some soft food.  He wants to leave today because he tells me he can manage this at home.   Discharge Exam: Vitals:   10/23/22 0415 10/23/22 0806  BP: 123/86 123/83  Pulse: 76 74  Resp: 18 18  Temp: 98.2 F (36.8 C) 98 F (36.7 C)  SpO2: 96% 96%   Vitals:   10/22/22 2130 10/23/22 0039 10/23/22 0415 10/23/22 0806  BP:  105/76 123/86 123/83  Pulse:  90 76 74  Resp:  18 18 18   Temp:  98.1 F (36.7 C) 98.2 F (36.8 C) 98 F (36.7 C)  TempSrc:  Oral Oral Oral  SpO2:  94% 96% 96%  Weight: 87.1 kg     Height: 5\' 11"  (1.803 m)       General: Pt is alert, awake, not in acute distress Cardiovascular: RRR, S1/S2 +, no rubs, no gallops Respiratory: CTA bilaterally, no wheezing, no rhonchi Abdominal: Soft, no palpable tenderness, ND, bowel sounds + Extremities: no edema, no cyanosis    The results of significant diagnostics from this hospitalization (including imaging, microbiology, ancillary and laboratory) are listed below for reference.     Microbiology: No results found for this or any previous visit (from the past 240 hour(s)).  Labs: BNP (last 3 results) No results for input(s): "BNP" in the last 8760 hours. Basic  Metabolic Panel: Recent Labs  Lab 10/21/22 1809 10/22/22 0500 10/22/22 1809 10/23/22 0336  NA 140 139 139 132*  K 3.2* 3.3* 2.4* 4.2  CL 95* 96* 103 97*  CO2 25 29 26 24   GLUCOSE 302* 211* 155* 161*  BUN 5* 7 8 9   CREATININE 0.78 0.75 0.61 0.69  CALCIUM 9.2 8.5* 6.8* 8.2*  MG  --  1.1* 2.0 1.7  PHOS  --  5.0*  --  3.3   Liver Function Tests: Recent Labs  Lab 10/21/22 1809 10/23/22 0336  AST 54* 52*  ALT 24 19  ALKPHOS 92 65  BILITOT 1.3* 2.2*  PROT 8.2* 7.1  ALBUMIN 4.3 3.6   Recent Labs  Lab 10/21/22 1809  LIPASE 39   No results for input(s): "AMMONIA" in the last 168 hours. CBC: Recent Labs  Lab 10/21/22 1809 10/22/22 0500 10/23/22 0336  WBC 10.4 8.1 5.5  HGB 15.1 13.5 12.6*  HCT 45.3 39.2 37.2*  MCV 85.2 84.1 85.1  PLT 203 107* 91*   Cardiac Enzymes: No results for input(s): "CKTOTAL", "CKMB", "CKMBINDEX", "TROPONINI" in the last 168 hours. BNP: Invalid input(s): "POCBNP" CBG: Recent Labs  Lab 10/22/22 2057 10/23/22 0025 10/23/22 0417 10/23/22 0724 10/23/22 1108  GLUCAP 249* 133* 169* 147* 204*   D-Dimer No results for input(s): "DDIMER" in the last 72 hours. Hgb A1c No results for input(s): "HGBA1C" in the last 72 hours. Lipid Profile No results for input(s): "CHOL", "HDL", "LDLCALC", "TRIG", "CHOLHDL", "LDLDIRECT" in the last 72 hours. Thyroid function studies No results for input(s): "TSH", "T4TOTAL", "T3FREE", "THYROIDAB" in the last 72 hours.  Invalid input(s): "FREET3" Anemia work up No results for input(s): "VITAMINB12", "FOLATE", "FERRITIN", "TIBC", "IRON", "RETICCTPCT" in the last 72 hours. Urinalysis    Component Value Date/Time   COLORURINE YELLOW 10/21/2022 1735   APPEARANCEUR CLEAR 10/21/2022 1735   LABSPEC 1.025 10/21/2022 1735   PHURINE 9.0 (H) 10/21/2022 1735   GLUCOSEU >=500 (A) 10/21/2022 1735   HGBUR NEGATIVE 10/21/2022 1735   BILIRUBINUR NEGATIVE 10/21/2022 1735   KETONESUR 20 (A) 10/21/2022 1735   PROTEINUR  30 (A) 10/21/2022 1735   NITRITE NEGATIVE 10/21/2022 1735   LEUKOCYTESUR NEGATIVE 10/21/2022 1735   Sepsis Labs Recent Labs  Lab 10/21/22 1809 10/22/22 0500 10/23/22 0336  WBC 10.4 8.1 5.5   Microbiology No results found for this or any previous visit (from the past 240 hour(s)).   Time coordinating discharge:  32 minutes  SIGNED:   10/24/22, MD  [facility] 10/23/2022, 12:48 PM

## 2022-10-23 NOTE — Progress Notes (Signed)
NEW ADMISSION NOTE New Admission Note:   Arrival Method: Wheelchair from ED Mental Orientation: Alert and oriented x4 Telemetry: Box #4 Assessment: Completed Skin: Intact IV: Left upper arm and Left forearm IV's, saline locked Pain: 0/10 Tubes: None Safety Measures: Safety Fall Prevention Plan has been given, discussed and implemented. Admission: Completed [facility] Orientation: Patient has been orientated to the room, unit and staff.  Family: Significant other at the bedside  Orders have been reviewed and implemented. Will continue to monitor the patient. Call light has been placed within reach and bed alarm has been activated.   Daneil Dan, RN

## 2022-10-23 NOTE — Progress Notes (Signed)
DISCHARGE NOTE HOME Shane Holmes is discharged Home per MD order. Discussed prescriptions and follow up appointments with the patient. medication list explained in detail. Patient verbalized understanding.  Skin clean, dry and intact without evidence of skin break down, no evidence of skin tears noted. IV catheters discontinued intact. Sites without signs and symptoms of complications. Dressings and pressure applied. Pt denies pain at the sites currently. No complaints noted.  Patient free of lines, drains, and wounds.   An After Visit Summary (AVS) was printed and given to the patient. Patient escorted via wheelchair, and discharged home via private auto.  Donivan Scull, RN

## 2022-11-05 ENCOUNTER — Other Ambulatory Visit: Payer: Self-pay

## 2022-11-05 DIAGNOSIS — F10939 Alcohol use, unspecified with withdrawal, unspecified: Secondary | ICD-10-CM

## 2022-11-05 DIAGNOSIS — K921 Melena: Secondary | ICD-10-CM

## 2022-11-05 DIAGNOSIS — K920 Hematemesis: Secondary | ICD-10-CM

## 2022-11-05 DIAGNOSIS — E1165 Type 2 diabetes mellitus with hyperglycemia: Secondary | ICD-10-CM

## 2022-11-05 DIAGNOSIS — G609 Hereditary and idiopathic neuropathy, unspecified: Secondary | ICD-10-CM

## 2022-11-05 DIAGNOSIS — R7401 Elevation of levels of liver transaminase levels: Secondary | ICD-10-CM

## 2022-11-05 DIAGNOSIS — R29898 Other symptoms and signs involving the musculoskeletal system: Secondary | ICD-10-CM

## 2022-11-05 DIAGNOSIS — G629 Polyneuropathy, unspecified: Secondary | ICD-10-CM

## 2022-12-18 ENCOUNTER — Emergency Department (HOSPITAL_COMMUNITY): Payer: Medicaid Other

## 2022-12-18 ENCOUNTER — Emergency Department (HOSPITAL_COMMUNITY)
Admission: EM | Admit: 2022-12-18 | Discharge: 2022-12-18 | Disposition: A | Discharge status: Home / Self Care | Payer: Medicaid Other | Attending: Emergency Medicine | Admitting: Emergency Medicine

## 2022-12-18 ENCOUNTER — Other Ambulatory Visit: Payer: Self-pay | Admitting: Neurology

## 2022-12-18 ENCOUNTER — Encounter: Payer: Self-pay | Admitting: Neurology

## 2022-12-18 ENCOUNTER — Encounter (HOSPITAL_COMMUNITY): Payer: Self-pay

## 2022-12-18 ENCOUNTER — Other Ambulatory Visit: Payer: Self-pay

## 2022-12-18 DIAGNOSIS — R209 Unspecified disturbances of skin sensation: Secondary | ICD-10-CM

## 2022-12-18 DIAGNOSIS — M79604 Pain in right leg: Secondary | ICD-10-CM

## 2022-12-18 DIAGNOSIS — W19XXXA Unspecified fall, initial encounter: Secondary | ICD-10-CM

## 2022-12-18 DIAGNOSIS — E1142 Type 2 diabetes mellitus with diabetic polyneuropathy: Secondary | ICD-10-CM | POA: Insufficient documentation

## 2022-12-18 DIAGNOSIS — Z7984 Long term (current) use of oral hypoglycemic drugs: Secondary | ICD-10-CM | POA: Diagnosis not present

## 2022-12-18 DIAGNOSIS — R079 Chest pain, unspecified: Secondary | ICD-10-CM

## 2022-12-18 DIAGNOSIS — G621 Alcoholic polyneuropathy: Secondary | ICD-10-CM

## 2022-12-18 DIAGNOSIS — F10939 Alcohol use, unspecified with withdrawal, unspecified: Secondary | ICD-10-CM

## 2022-12-18 DIAGNOSIS — G609 Hereditary and idiopathic neuropathy, unspecified: Secondary | ICD-10-CM

## 2022-12-18 DIAGNOSIS — I10 Essential (primary) hypertension: Secondary | ICD-10-CM | POA: Insufficient documentation

## 2022-12-18 DIAGNOSIS — R531 Weakness: Secondary | ICD-10-CM | POA: Insufficient documentation

## 2022-12-18 DIAGNOSIS — G629 Polyneuropathy, unspecified: Secondary | ICD-10-CM

## 2022-12-18 DIAGNOSIS — R002 Palpitations: Secondary | ICD-10-CM | POA: Diagnosis not present

## 2022-12-18 DIAGNOSIS — E1165 Type 2 diabetes mellitus with hyperglycemia: Secondary | ICD-10-CM

## 2022-12-18 DIAGNOSIS — R29898 Other symptoms and signs involving the musculoskeletal system: Secondary | ICD-10-CM

## 2022-12-18 LAB — CBG MONITORING, ED: Glucose-Capillary: 196 mg/dL — ABNORMAL HIGH (ref 70–99)

## 2022-12-18 LAB — COMPREHENSIVE METABOLIC PANEL
ALT: 21 U/L (ref 0–44)
AST: 63 U/L — ABNORMAL HIGH (ref 15–41)
Albumin: 3.9 g/dL (ref 3.5–5.0)
Alkaline Phosphatase: 86 U/L (ref 38–126)
Anion gap: 15 (ref 5–15)
BUN: <5 mg/dL — ABNORMAL LOW (ref 6–20)
CO2: 19 mmol/L — ABNORMAL LOW (ref 22–32)
Calcium: 8.9 mg/dL (ref 8.9–10.3)
Chloride: 104 mmol/L (ref 98–111)
Creatinine, Ser: 0.56 mg/dL — ABNORMAL LOW (ref 0.61–1.24)
GFR, Estimated: >60 mL/min (ref >60)
Glucose, Bld: 193 mg/dL — ABNORMAL HIGH (ref 70–99)
Potassium: 3.2 mmol/L — ABNORMAL LOW (ref 3.5–5.1)
Sodium: 138 mmol/L (ref 135–145)
Total Bilirubin: 0.5 mg/dL (ref 0.3–1.2)
Total Protein: 7.7 g/dL (ref 6.5–8.1)

## 2022-12-18 LAB — I-STAT CHEM 8, ED
BUN: <3 mg/dL — ABNORMAL LOW (ref 6–20)
Calcium, Ion: 0.93 mmol/L — ABNORMAL LOW (ref 1.15–1.40)
Chloride: 105 mmol/L (ref 98–111)
Creatinine, Ser: 0.5 mg/dL — ABNORMAL LOW (ref 0.61–1.24)
Glucose, Bld: 197 mg/dL — ABNORMAL HIGH (ref 70–99)
HCT: 45 % (ref 39.0–52.0)
Hemoglobin: 15.3 g/dL (ref 13.0–17.0)
Potassium: 3.3 mmol/L — ABNORMAL LOW (ref 3.5–5.1)
Sodium: 141 mmol/L (ref 135–145)
TCO2: 19 mmol/L — ABNORMAL LOW (ref 22–32)

## 2022-12-18 LAB — DIFFERENTIAL
Abs Immature Granulocytes: 0.05 10*3/uL (ref 0.00–0.07)
Basophils Absolute: 0.1 10*3/uL (ref 0.0–0.1)
Basophils Relative: 1 %
Eosinophils Absolute: 0.1 10*3/uL (ref 0.0–0.5)
Eosinophils Relative: 1 %
Immature Granulocytes: 1 %
Lymphocytes Relative: 23 %
Lymphs Abs: 2.2 10*3/uL (ref 0.7–4.0)
Monocytes Absolute: 0.8 10*3/uL (ref 0.1–1.0)
Monocytes Relative: 8 %
Neutro Abs: 6.7 10*3/uL (ref 1.7–7.7)
Neutrophils Relative %: 66 %

## 2022-12-18 LAB — ETHANOL: Alcohol, Ethyl (B): 46 mg/dL — ABNORMAL HIGH (ref <10)

## 2022-12-18 LAB — CBC
HCT: 42.2 % (ref 39.0–52.0)
Hemoglobin: 13.9 g/dL (ref 13.0–17.0)
MCH: 28.5 pg (ref 26.0–34.0)
MCHC: 32.9 g/dL (ref 30.0–36.0)
MCV: 86.5 fL (ref 80.0–100.0)
Platelets: 193 10*3/uL (ref 150–400)
RBC: 4.88 MIL/uL (ref 4.22–5.81)
RDW: 18.5 % — ABNORMAL HIGH (ref 11.5–15.5)
WBC: 9.9 10*3/uL (ref 4.0–10.5)
nRBC: 0 % (ref 0.0–0.2)

## 2022-12-18 LAB — TROPONIN I (HIGH SENSITIVITY)
Troponin I (High Sensitivity): 5 ng/L (ref <18)
Troponin I (High Sensitivity): 8 ng/L (ref <18)

## 2022-12-18 LAB — APTT: aPTT: 35 seconds (ref 24–36)

## 2022-12-18 LAB — PROTIME-INR
INR: 1.2 (ref 0.8–1.2)
Prothrombin Time: 15.1 seconds (ref 11.4–15.2)

## 2022-12-18 MED ORDER — GABAPENTIN 300 MG PO CAPS: 600.0000 mg | ORAL_CAPSULE | Freq: Three times a day (TID) | ORAL | 5 refills | Status: DC

## 2022-12-18 MED ORDER — IOHEXOL 350 MG/ML SOLN
150.0000 mL | Freq: Once | INTRAVENOUS | Status: AC | PRN
  Administered 2022-12-18: 150 mL via INTRAVENOUS

## 2022-12-18 MED ORDER — SODIUM CHLORIDE 0.9% FLUSH
3.0000 mL | Freq: Once | INTRAVENOUS | Status: AC
  Administered 2022-12-18: 3 mL via INTRAVENOUS

## 2022-12-18 MED ORDER — SODIUM CHLORIDE 0.9 % IV BOLUS
500.0000 mL | Freq: Once | INTRAVENOUS | Status: AC
  Administered 2022-12-18: 500 mL via INTRAVENOUS

## 2022-12-18 MED ORDER — HYDROCODONE-ACETAMINOPHEN 5-325 MG PO TABS
1.0000 | ORAL_TABLET | Freq: Once | ORAL | Status: AC
  Administered 2022-12-18: 1 via ORAL
  Filled 2022-12-18: qty 1

## 2022-12-18 MED ORDER — POTASSIUM CHLORIDE CRYS ER 20 MEQ PO TBCR
40.0000 meq | EXTENDED_RELEASE_TABLET | Freq: Once | ORAL | Status: AC
  Administered 2022-12-18: 40 meq via ORAL
  Filled 2022-12-18: qty 2

## 2022-12-18 MED ORDER — LORAZEPAM 2 MG/ML IJ SOLN
1.0000 mg | Freq: Once | INTRAMUSCULAR | Status: AC
  Administered 2022-12-18: 1 mg via INTRAVENOUS
  Filled 2022-12-18: qty 1

## 2022-12-18 MED ORDER — LABETALOL HCL 5 MG/ML IV SOLN
5.0000 mg | Freq: Once | INTRAVENOUS | Status: AC
  Administered 2022-12-18: 5 mg via INTRAVENOUS
  Filled 2022-12-18: qty 4

## 2022-12-18 MED ORDER — LORAZEPAM 2 MG/ML PO CONC: 1.0000 mg | Freq: Once | ORAL | Status: DC

## 2022-12-18 MED ORDER — CHLORDIAZEPOXIDE HCL 25 MG PO CAPS: ORAL_CAPSULE | ORAL | 0 refills | Status: DC

## 2022-12-18 NOTE — ED Notes (Signed)
Patient transported to MRI 

## 2022-12-18 NOTE — ED Provider Triage Note (Signed)
Emergency Medicine Provider Triage Evaluation Note  Shane Holmes , a 37 y.o. male  was evaluated in triage.  Pt complains of facial tingling, palpitations, and left arm weakness.  Ports double/blurry vision as well as a pounding headache he reports that he stood up earlier and fell hit the front of his head as well as his left knee which is now hurting him.  Patient does have a history significant for a CVA last year but does not have any remaining deficits.  He does not take any blood thinners.  Last known normal was 1300.   Review of Systems  Positive: As above Negative: As above  Physical Exam  BP (!) 156/112   Pulse (!) 124   Temp 98.2 F (36.8 C) (Oral)   Resp 18   Ht 5\' 11"  (1.803 m)   Wt 86.2 kg   SpO2 100%   BMI 26.50 kg/m  Gen:   Awake, no distress   Resp:  Normal effort  MSK:   Patient is unable to raise left arm against gravity, cannot form a fist to test grip strength.  Bilateral lower extremities without weakness.  Patient does have significant pain in the left thigh and knee.   Other:  No facial droop, slurred speech.  Medical Decision Making  Medically screening exam initiated at 3:35 PM.  Appropriate orders placed.  Sedalia Muta was informed that the remainder of the evaluation will be completed by another provider, this initial triage assessment does not replace that evaluation, and the importance of remaining in the ED until their evaluation is complete.  Patient was activated as CODE STROKE.    Theressa Stamps R, [place] 12/18/22 1538

## 2022-12-18 NOTE — Code Documentation (Signed)
[name] is a 37 yr old male presenting to [facility] on 12/18/2022 with a PMH of DM, [place] use disorder, neuropathy. He is not on any known blood thinners. Pt is from home where he was last known well today at 1300 after which he noted his left side was weak and heavy.    Code stroke activated in triage. Pt met stroke team in CT scan. Labs, CBG obtained. NIHSS 8. Please see documentation for times and HIHSS details. Pt with weakness and sensory loss on the left. CTNC neg for acute hemorrhage per Dr Quinn Axe. CTA obtained which was also neg for LVO or acute dissection.per Dr Quinn Axe.     Pt taken to emergent MRI for treatment decision. Per Dr Quinn Axe, Scan is negative for ischemia. Code stroke cancelled per Dr Quinn Axe at [phone]. Bedside handoff with Raymar RN.

## 2022-12-18 NOTE — ED Provider Notes (Signed)
[facility] Provider Note   CSN: [id number] Arrival date & time: 12/18/22  1433     History  Chief Complaint  Patient presents with   Stroke-like s/s     Shane Holmes is a 37 y.o. male.  37 year old male with complaint of facial tingling, palpitations and left arm weakness. Patient states he ate lunch and was sitting on the sofa, took his gabapentin and began to notice blurry vision, feeling light headed and heart racing, developed pain on the left side of the chest, radiates up left side of neck, with left arm weakness/numbness, states he can't feel his left arm. Patient stood up and fell, injuring his left knee and left ankle. States symptoms are similar to his stroke symptoms last year. 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         Home Medications Prior to Admission medications   Medication Sig Start Date End Date Taking? Authorizing Provider  chlordiazePOXIDE (LIBRIUM) 25 MG capsule 50mg  PO TID x 1D, then 25-50mg  PO BID X 1D, then 25-50mg  PO QD X 1D 12/18/22  Yes Army MeliaMurphy, Ambriella Kitt A, PA-C  albuterol (VENTOLIN HFA) 108 (90 Base) MCG/ACT inhaler INHALE 2 PUFFS INTO THE LUNGS EVERY SIX HOURS AS NEEDED FOR WHEEZING OR SHORTNESS OF BREATH. Patient not taking: Reported on 10/22/2022 01/26/21 08/27/22  Maretta BeesGhimire, Shanker M, MD  folic acid (FOLVITE) 1 MG tablet Take 1 tablet (1 mg total) by mouth daily. 01/26/21   Ghimire, Werner LeanShanker M, MD  gabapentin (NEURONTIN) 300 MG capsule Take 2 capsules (600 mg total) by mouth 3 (three) times daily. 12/18/22   Antony MaduraHill, Jeremy L, MD  metFORMIN (GLUCOPHAGE) 500 MG tablet Take 1 tablet (500 mg total) by mouth 2 (two) times daily with a meal. 10/23/22 01/21/23  Dorcas CarrowGhimire, Kuber, MD  pantoprazole (PROTONIX) 40 MG tablet Take 1 tablet (40 mg total) by mouth 2 (two) times daily. 10/23/22   Dorcas CarrowGhimire, Kuber, MD  thiamine (VITAMIN B1) 100 MG tablet Take 1 tablet (100 mg total) by mouth daily. 09/27/22   Antony MaduraHill,  Jeremy L, MD  dicyclomine (BENTYL) 20 MG tablet Take 1 tablet (20 mg total) by mouth 2 (two) times daily. Patient not taking: Reported on 11/02/2019 03/02/14 11/03/19  Arthor CaptainHarris, Abigail, PA-C      Allergies    Lidocaine-glycerin    Review of Systems   Review of Systems Negative except as per HPI Physical Exam Updated Vital Signs BP (!) 156/101   Pulse 95   Temp 99.2 F (37.3 C) (Oral)   Resp 13   Ht 5\' 11"  (1.803 m)   Wt 86.2 kg   SpO2 100%   BMI 26.50 kg/m  Physical Exam Vitals and nursing note reviewed.  Constitutional:      General: He is not in acute distress.    Appearance: He is well-developed. He is not diaphoretic.  HENT:     Head: Normocephalic and atraumatic.     Mouth/Throat:     Mouth: Mucous membranes are moist.  Eyes:     Extraocular Movements: Extraocular movements intact.     Pupils: Pupils are equal, round, and reactive to light.  Cardiovascular:     Rate and Rhythm: Normal rate and regular rhythm.     Pulses: Normal pulses.     Heart sounds: Normal heart sounds.  Pulmonary:     Effort: Pulmonary effort is normal.     Breath sounds: Normal breath sounds.  Abdominal:  Palpations: Abdomen is soft.     Tenderness: There is no abdominal tenderness.  Musculoskeletal:        General: Tenderness present. No swelling, deformity or signs of injury.     Right lower leg: No edema.     Left lower leg: No edema.     Comments: Generalized left knee and ankle tenderness without swelling, ecchymosis, contusions.   Skin:    General: Skin is warm and dry.     Findings: No erythema or rash.  Neurological:     Mental Status: He is alert and oriented to person, place, and time.     Sensory: Sensory deficit present.     Motor: Weakness present.     Comments: Reports diffuse left arm numbness and weakness, radial pulse present  Psychiatric:        Behavior: Behavior normal.     ED Results / Procedures / Treatments   Labs (all labs ordered are listed, but only  abnormal results are displayed) Labs Reviewed  CBC - Abnormal; Notable for the following components:      Result Value   RDW 18.5 (*)    All other components within normal limits  COMPREHENSIVE METABOLIC PANEL - Abnormal; Notable for the following components:   Potassium 3.2 (*)    CO2 19 (*)    Glucose, Bld 193 (*)    BUN <5 (*)    Creatinine, Ser 0.56 (*)    AST 63 (*)    All other components within normal limits  ETHANOL - Abnormal; Notable for the following components:   Alcohol, Ethyl (B) 46 (*)    All other components within normal limits  I-STAT CHEM 8, ED - Abnormal; Notable for the following components:   Potassium 3.3 (*)    BUN <3 (*)    Creatinine, Ser 0.50 (*)    Glucose, Bld 197 (*)    Calcium, Ion 0.93 (*)    TCO2 19 (*)    All other components within normal limits  CBG MONITORING, ED - Abnormal; Notable for the following components:   Glucose-Capillary 196 (*)    All other components within normal limits  PROTIME-INR  APTT  DIFFERENTIAL  TROPONIN I (HIGH SENSITIVITY)  TROPONIN I (HIGH SENSITIVITY)    EKG EKG Interpretation  Date/Time:  Wednesday December 18 2022 15:22:23 EST Ventricular Rate:  117 PR Interval:  158 QRS Duration: 82 QT Interval:  316 QTC Calculation: 440 R Axis:   8 Text Interpretation: Sinus tachycardia Septal infarct , age undetermined Abnormal ECG When compared with ECG of 18-Jun-2022 23:10, PREVIOUS ECG IS PRESENT Confirmed by Vivien Rossetti ([id number]) on 12/18/2022 4:11:25 PM  Radiology DG Knee Complete 4 Views Left  Result Date: 12/18/2022 CLINICAL DATA:  Fall.  Pain. EXAM: LEFT KNEE - COMPLETE 4+ VIEW COMPARISON:  None Available. FINDINGS: Normal bone mineralization. Joint spaces are preserved. No acute fracture is seen. No dislocation. No joint effusion. IMPRESSION: Normal left knee radiographs. Electronically Signed   By: Neita Garnet M.D.   On: 12/18/2022 17:44   DG Ankle Complete Left  Result Date: 12/18/2022 CLINICAL  DATA:  Fall.  Pain. EXAM: LEFT ANKLE COMPLETE - 3+ VIEW COMPARISON:  None Available. FINDINGS: Normal bone mineralization. The ankle mortise is symmetric and intact. Minimal dorsal talonavicular degenerative osteophytosis. No acute fracture or dislocation. IMPRESSION: No acute fracture. Electronically Signed   By: Neita Garnet M.D.   On: 12/18/2022 17:40   MR BRAIN WO CONTRAST  Result Date: 12/18/2022 CLINICAL DATA:  Neuro deficit, acute, stroke suspected LUE weakness and numbness, stroke code protocol for TNK treatment decision, please do DWI/ADC/FLAIR first EXAM: MRI HEAD WITHOUT CONTRAST TECHNIQUE: Multiplanar, multiecho pulse sequences of the brain and surrounding structures were obtained without intravenous contrast. COMPARISON:  Same day CTA head/neck and CT code stroke. FINDINGS: Brain: No acute infarction, hemorrhage, hydrocephalus, extra-axial collection or mass lesion. Vascular: Major arterial flow voids are maintained skull base. Skull and upper cervical spine: Normal marrow signal. Sinuses/Orbits: Paranasal sinus mucosal thickening with air-fluid levels in the maxillary sinuses. No acute orbital findings. Other: No sizable mastoid effusions. IMPRESSION: Normal brain MRI.  No evidence of acute intracranial abnormality. Electronically Signed   By: Margaretha Sheffield M.D.   On: 12/18/2022 16:38   CT ANGIO HEAD NECK W WO CM (CODE STROKE)  Result Date: 12/18/2022 CLINICAL DATA:  Neuro deficit, acute stroke suspected. Left arm numbness and weakness, neck pain, chest pain. EXAM: CT ANGIOGRAPHY HEAD AND NECK TECHNIQUE: Multidetector CT imaging of the head and neck was performed using the standard protocol during bolus administration of intravenous contrast. Multiplanar CT image reconstructions and MIPs were obtained to evaluate the vascular anatomy. Carotid stenosis measurements (when applicable) are obtained utilizing NASCET criteria, using the distal internal carotid diameter as the denominator.  RADIATION DOSE REDUCTION: This exam was performed according to the departmental dose-optimization program which includes automated exposure control, adjustment of the mA and/or kV according to patient size and/or use of iterative reconstruction technique. CONTRAST:  149mL OMNIPAQUE IOHEXOL 350 MG/ML SOLN COMPARISON:  Head CT 12/18/2022.  Brain MRI 01/10/2022. FINDINGS: CTA NECK FINDINGS Aortic arch: Three-vessel arch configuration. Arch vessel origins are patent. Right carotid system: The common and internal carotid arteries are patent to the skull base without stenosis, aneurysm or dissection. Left carotid system: The common and internal carotid arteries are patent to the skull base without stenosis, aneurysm or dissection. 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 The left vertebral artery is patent to the confluence with the basilar. Skeleton: No suspicious bone lesions. Extensive dental caries with multiple periapical lucencies of the maxillary and mandibular teeth. Other neck: Unremarkable. Upper chest: Mild bronchial wall thickening. Right apical pleural-parenchymal scarring. Review of the MIP images confirms the above findings CTA HEAD FINDINGS Anterior circulation: Intracranial ICAs are patent without stenosis or aneurysm. The proximal ACAs and MCAs are patent without stenosis. 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 Distal branches are symmetric. Posterior circulation: Normal basilar artery. The SCAs, AICAs and PICAs are patent proximally. The PCAs are patent proximally without stenosis or aneurysm. Distal branches are symmetric. Venous sinuses: Contrast opacification through the distal transverse sinuses without evidence of stenosis or thrombosis. Left dominant transverse sinus. Anatomic variants: The posterior communicating arteries are not visualized. Review of the MIP images confirms the above findings IMPRESSION: 1. No acute large  vessel occlusion. 2. 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 Electronically Signed   By: Emmit Alexanders M.D.   On: 12/18/2022 16:25   CT Angio Chest/Abd/Pel for Dissection W and/or W/WO  Result Date: 12/18/2022 CLINICAL DATA:  r/o aortic dissection, L arm numbness and weakness, neck pain, chest pain EXAM: CT ANGIOGRAPHY CHEST, ABDOMEN AND PELVIS TECHNIQUE: Non-contrast CT of the chest was initially obtained. Multidetector CT imaging through the chest, abdomen and pelvis was performed using the standard protocol during bolus administration of intravenous contrast. Multiplanar reconstructed images and MIPs  were obtained and reviewed to evaluate the vascular anatomy. RADIATION DOSE REDUCTION: This exam was performed according to the departmental dose-optimization program which includes automated exposure control, adjustment of the mA and/or kV according to patient size and/or use of iterative reconstruction technique. CONTRAST:  OMNIPAQUE IOHEXOL 350 MG/ML SOLN COMPARISON:  CT AP, 06/19/2022. FINDINGS: CTA CHEST FINDINGS Cardiovascular: Preferential opacification of the thoracic aorta. No evidence of thoracic aortic aneurysm or dissection. Normal heart size. No pericardial effusion. Dilated main PA, measuring 3.4 cm. No segmental or larger pulmonary embolus. Mediastinum/Nodes: No enlarged mediastinal, hilar, or axillary lymph nodes. Thyroid gland, trachea, and esophagus demonstrate no significant findings. Lungs/Pleura: Lungs are clear without focal consolidation, mass or suspicious pulmonary nodule. No pleural effusion or pneumothorax. Musculoskeletal: No acute chest wall abnormality. Review of the MIP images confirms the above findings. CTA ABDOMEN AND PELVIS FINDINGS VASCULAR Aorta: Mild burden of calcified and noncalcified aortic atherosclerosis. Normal caliber aorta without aneurysm, dissection, vasculitis or significant stenosis. Celiac:  Patent without evidence of aneurysm, dissection, vasculitis or significant stenosis. SMA: Patent without evidence of aneurysm, dissection, vasculitis or significant stenosis. Renals: Single RIGHT and a main and accessory LEFT renal arteries are present. All renal arteries are patent without evidence of aneurysm, dissection, vasculitis, fibromuscular dysplasia or significant stenosis. IMA: Patent without evidence of aneurysm, dissection, vasculitis or significant stenosis. Pelvis: Patent without evidence of aneurysm, dissection, vasculitis or significant stenosis. Veins: No obvious venous abnormality within the limitations of this arterial phase study. Review of the MIP images confirms the above findings. NON-VASCULAR Hepatobiliary: Hepatomegaly, with the RIGHT hepatic lobe measuring up to 22 cm. No focal liver abnormality is seen. No gallstones, gallbladder wall thickening, or biliary dilatation. Pancreas: No pancreatic ductal dilatation or surrounding inflammatory changes. Spleen: Splenomegaly, measuring up to 15 cm Adrenals/Urinary Tract: Adrenal glands are unremarkable. 1.5 cm RIGHT superior renal pole renal cortical cyst. Kidneys are otherwise normal, without renal calculi or hydronephrosis. Bladder is unremarkable. 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 Mild fat stranding with additional trace fluid at the retrocardiac space. Stomach is within normal limits. Appendix appears normal. No evidence of bowel wall thickening, distention, or inflammatory changes. Lymphatic: No enlarged abdominal or pelvic lymph nodes. Reproductive: Prostate is unremarkable. Other: No abdominal wall hernia or abnormality. No abdominopelvic ascites. Musculoskeletal: No acute osseous findings. Bilateral L5 spondylolysis. Review of the MIP images confirms the above findings. No follow-up is indicated for incidental findings, unless specifically mentioned. IMPRESSION: 1. No acute vascular findings within the  chest, abdomen or pelvis. 2. 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 This can be a cause of epigastric abdominal pain. 3. No additional acute abdominopelvic process. 4. Hepatosplenomegaly. Additional incidental, chronic and senescent findings as above. Electronically Signed   By: Roanna Banning M.D.   On: 12/18/2022 16:24   CT HEAD CODE STROKE WO CONTRAST  Result Date: 12/18/2022 CLINICAL DATA:  Code stroke. Neuro deficit, acute, stroke suspected left arm weakness, last known normal 13:00 EXAM: CT HEAD WITHOUT CONTRAST TECHNIQUE: Contiguous axial images were obtained from the base of the skull through the vertex without intravenous contrast. RADIATION DOSE REDUCTION: This exam was performed according to the departmental dose-optimization program which includes automated exposure control, adjustment of the mA and/or kV according to patient size and/or use of iterative reconstruction technique. COMPARISON:  CT head February 8, 23. FINDINGS: Brain: No evidence of acute large vascular territory infarction, hemorrhage, hydrocephalus, extra-axial collection or mass lesion/mass effect. Vascular: No hyperdense vessel identified. Skull: No acute fracture. Sinuses/Orbits: Paranasal  sinus disease with frothy secretions in the maxillary sinuses. No acute orbital findings. Other: No mastoid effusions. ASPECTS [facility] Stroke Program Early CT Score) total score (0-10 with 10 being normal): 10. IMPRESSION: No evidence of acute intracranial abnormality.  ASPECTS is 10. Code stroke imaging results were communicated on 12/18/2022 at 3:55 pm to provider [facility] via secure text paging. Electronically Signed   By: Feliberto Harts M.D.   On: 12/18/2022 15:56    Procedures Procedures    Medications Ordered in ED Medications  sodium chloride flush (NS) 0.9 % injection 3 mL (3 mLs Intravenous Given 12/18/22 1544)  iohexol (OMNIPAQUE) 350 MG/ML injection 150 mL (150 mLs Intravenous Contrast Given 12/18/22 1604)   LORazepam (ATIVAN) injection 1 mg (1 mg Intravenous Given 12/18/22 1736)  LORazepam (ATIVAN) injection 1 mg (1 mg Intravenous Given 12/18/22 2003)  sodium chloride 0.9 % bolus 500 mL (0 mLs Intravenous Stopped 12/18/22 2131)  potassium chloride SA (KLOR-CON M) CR tablet 40 mEq (40 mEq Oral Given 12/18/22 2003)  labetalol (NORMODYNE) injection 5 mg (5 mg Intravenous Given 12/18/22 2003)  HYDROcodone-acetaminophen (NORCO/VICODIN) 5-325 MG per tablet 1 tablet (1 tablet Oral Given 12/18/22 2035)    ED Course/ Medical Decision Making/ A&P                             Medical Decision Making Amount and/or Complexity of Data Reviewed Radiology: ordered.  Risk Prescription drug management.   This patient presents to the ED for concern of left arm weakness and numbness with visual disturbance, fall resulting in pain in the left knee and ankle and chest pain radiating up into the neck, this involves an extensive number of treatment options, and is a complaint that carries with it a high risk of complications and morbidity.  The differential diagnosis includes but not limited to CVA, dissection, nerve impingement, alcohol withdrawal   Co morbidities that complicate the patient evaluation  Alcohol abuse (reports recovering alcoholic, had his usual 2 beers today, wife arrives and states he has been drinking heavily and has been cutting back, requests assistance with detox).    Additional history obtained:  Additional history obtained from wife at bedside who contributes to history as above External records from outside source obtained and reviewed including prior imaging and neurology notes from February 2023 visit where patient had similar presentation workup negative for stroke   Lab Tests:  I Ordered, and personally interpreted labs.  The pertinent results include: CMP with mild hypokalemia with potassium of 3.2.  CBG normal.  Troponins trended and unchanged on 2 checks.  Alcohol elevated at 46.   CBC unremarkable.   Imaging Studies ordered:  I ordered imaging studies including CT head, CTA head and neck, CT dissection study c/a/p, XR left knee and left ankle  I independently visualized and interpreted imaging which showed no acute findings I agree with the radiologist interpretation   Cardiac Monitoring: / EKG:  The patient was maintained on a cardiac monitor.  I personally viewed and interpreted the cardiac monitored which showed an underlying rhythm of: Sinus tach, rate 117   Consultations Obtained:  I requested consultation with the Dr. Selina Cooley with neurology,  and discussed lab and imaging findings as well as pertinent plan - they recommend: cleared from the neuro team.  Case discussed with Dr. Elpidio Anis, ER attending, agrees with plan of care, can follow up with OP neuro.    Problem List / ED Course /  Critical interventions / Medication management  37 year old male presents with complaint of left arm weakness and numbness with pain in the chest radiating up into the left side of the neck, blurred vision as well as fall resulting in pain in his left knee and ankle.  Reports prior history of stroke however on further chart review, prior stroke workups have been negative.  Code stroke activated on arrival to triage.  Patient was met by the neuroteam in the CT scanner.  Patient was found to have weakness and numbness of the left arm.  Does have left leg pain and reports baseline neuropathy with worsening pain in the knee and ankle secondary to his fall today.  His workup is very comprehensive due to his broad range of complaints with high risk.  His workup is largely reassuring.  He is hypertensive and tachycardic, improved with fluids and Ativan.  Do not suspect DTs, skin is dry, no tremor, no hallucinations, no agitation.  Patient's wife arrives to state that patient has been drinking heavily and has been trying to cut back.  Patient requests Ativan at discharge to continue detox  process, offered Librium taper patient is agreeable.  Patient is advised to follow-up with his neurologist regarding the left arm numbness/weakness.  Provided with information on behavioral health urgent care should he need further assistance with alcohol abuse. I ordered medication including Ativan for concerns for alcohol withdrawal, potassium for mild hypokalemia.  Labetalol for elevated blood pressure.  Norco for pain.   Reevaluation of the patient after these medicines showed that the patient improved I have reviewed the patients home medicines and have made adjustments as needed   Social Determinants of Health:  Lives with spouse, history of alcohol abuse, has specialty care team for follow up   Test / Admission - Considered:  Discharged to follow up with patient's neurologist for further work up of the left arm weakness/numbness         Final Clinical Impression(s) / ED Diagnoses Final diagnoses:  Fall, initial encounter  Left arm weakness  Chest pain, unspecified type    Rx / DC Orders ED Discharge Orders          Ordered    chlordiazePOXIDE (LIBRIUM) 25 MG capsule        12/18/22 2146              Tacy Learn, PA-C 12/18/22 2249    Elgie Congo, MD 12/19/22 1254

## 2022-12-18 NOTE — Consult Note (Signed)
Neurology Consultation  Reason for Consult: Code stroke Referring Physician: dr. Elpidio Anis  CC: stroke like symptoms with left side weakness   History is obtained from:from patient and medical record   HPI: Shane Holmes is a 37 y.o. male with past medical history of ETOH abuse (12 beers daily), DTs with alcohol withdrawal,current smoker, peripheral neuropathy, DM, and GI bleed who presents to [facility] ED for evaluation of acute onset of blurry vision, followed by his heart racing with CP, became lightheaded and developed numbness and weakness of his left arm after taking his gabapentin. He also experienced a fall landing on his left side and c/o left ankle pain and leg pain. Chest pain 8-9/10  on left side of chest and radiates up to his neck. LKW 1300. NIHSS 5. CT head no acute abnormality, ASPECTS 10. CTA angio head and neck with no LVO or dissection  Patient taken to MRI STAT for treatment decision. MRI brain negative for acute stroke. Code stroke has been cancelled   LKW: 1300 IV thrombolysis given?: no, not a stroke  EVT:  No LVO  Premorbid modified Rankin scale (mRS):  0-Completely asymptomatic and back to baseline post-stroke  ROS: Full ROS was performed and is negative except as noted in the HPI.   Past Medical History:  Diagnosis Date   Alcohol abuse    Asthma    Back pain    Bulging lumbar disc    COVID    01/2021   Encounter for lumbar puncture    Hematochezia    Hypokalemia    Hyponatremia    Lower GI bleed    Neuropathy      Family History  Problem Relation Age of Onset   Cirrhosis Mother    Cirrhosis Father    Cancer Maternal Grandfather    Cancer Paternal Grandfather    Diabetes Maternal Aunt    Hypertension Neg Hx      Social History:   reports that he has been smoking cigarettes. He has never used smokeless tobacco. He reports current alcohol use of about 6.0 standard drinks of alcohol per week. He reports current drug use. Drug:  Marijuana.  Medications  Current Facility-Administered Medications:    sodium chloride flush (NS) 0.9 % injection 3 mL, 3 mL, Intravenous, Once, Clark, Meghan R, PA  Current Outpatient Medications:    albuterol (VENTOLIN HFA) 108 (90 Base) MCG/ACT inhaler, INHALE 2 PUFFS INTO THE LUNGS EVERY SIX HOURS AS NEEDED FOR WHEEZING OR SHORTNESS OF BREATH. (Patient not taking: Reported on 10/22/2022), Disp: 18 g, Rfl: 0   folic acid (FOLVITE) 1 MG tablet, Take 1 tablet (1 mg total) by mouth daily., Disp: 30 tablet, Rfl: 0   gabapentin (NEURONTIN) 300 MG capsule, Take 2 capsules (600 mg total) by mouth 3 (three) times daily., Disp: 90 capsule, Rfl: 5   metFORMIN (GLUCOPHAGE) 500 MG tablet, Take 1 tablet (500 mg total) by mouth 2 (two) times daily with a meal., Disp: 60 tablet, Rfl: 2   pantoprazole (PROTONIX) 40 MG tablet, Take 1 tablet (40 mg total) by mouth 2 (two) times daily., Disp: 60 tablet, Rfl: 3   thiamine (VITAMIN B1) 100 MG tablet, Take 1 tablet (100 mg total) by mouth daily., Disp: 30 tablet, Rfl: 3   Exam: Current vital signs: BP (!) 156/112   Pulse (!) 124   Temp 98.2 F (36.8 C) (Oral)   Resp 18   Ht 5\' 11"  (1.803 m)   Wt 86.2 kg   SpO2 100%  BMI 26.50 kg/m  Vital signs in last 24 hours: Temp:  [98.2 F (36.8 C)] 98.2 F (36.8 C) (01/17 1514) Pulse Rate:  [124] 124 (01/17 1514) Resp:  [18] 18 (01/17 1514) BP: (156)/(112) 156/112 (01/17 1514) SpO2:  [100 %] 100 % (01/17 1514) Weight:  [86.2 kg] 86.2 kg (01/17 1514)  GENERAL: Awake, alert in NAD HEENT: - Normocephalic and atraumatic, dry mm LUNGS - Clear to auscultation bilaterally with no wheezes CV - S1S2 RRR, no m/r/g, equal pulses bilaterally. ABDOMEN - Soft, nontender, nondistended with normoactive BS Ext: warm, well perfused, intact peripheral pulses, no edema  NEURO:  Mental Status: AA&Ox4 Language: speech is clear.  Naming, repetition, fluency, and comprehension intact. Cranial Nerves: PERRL EOMI, visual  fields full, no facial asymmetry, facial sensation intact, hearing intact, tongue/uvula/soft palate midline, normal sternocleidomastoid and trapezius muscle strength. No evidence of tongue atrophy or fibrillations Motor: left arm able to briefly lift off bed, cannot wiggle fingers or grip, right arm 5/5, left lower 4/5 (due to pain), right leg 5/5 Tone: is normal and bulk is normal Sensation- decreased on left arm, baseline sensation deficits on bilateral lowers up to mid shin due to neuropathy  Coordination: FTN intact bilaterally, no ataxia in BLE. Gait- deferred  NIHSS 1a Level of Conscious.: 0 1b LOC Questions: 0 1c LOC Commands: 0 2 Best Gaze: 0 3 Visual: 0 4 Facial Palsy: 0 5a Motor Arm - left: 3 5b Motor Arm - Right: 0 6a Motor Leg - Left: 0 6b Motor Leg - Right: 0 7 Limb Ataxia: 0 8 Sensory: 2 9 Best Language: 0 10 Dysarthria: 0 11 Extinct. and Inatten.: 0 TOTAL: 5   Labs I have reviewed labs in epic and the results pertinent to this consultation are:  CBC    Component Value Date/Time   WBC 5.5 10/23/2022 0336   RBC 4.37 10/23/2022 0336   HGB 15.3 12/18/2022 1545   HCT 45.0 12/18/2022 1545   PLT 91 (L) 10/23/2022 0336   MCV 85.1 10/23/2022 0336   MCH 28.8 10/23/2022 0336   MCHC 33.9 10/23/2022 0336   RDW 15.5 10/23/2022 0336   LYMPHSABS 1.1 06/20/2022 0433   MONOABS 0.6 06/20/2022 0433   EOSABS 0.1 06/20/2022 0433   BASOSABS 0.1 06/20/2022 0433    CMP     Component Value Date/Time   NA 141 12/18/2022 1545   K 3.3 (L) 12/18/2022 1545   CL 105 12/18/2022 1545   CO2 24 10/23/2022 0336   GLUCOSE 197 (H) 12/18/2022 1545   BUN <3 (L) 12/18/2022 1545   CREATININE 0.50 (L) 12/18/2022 1545   CALCIUM 8.2 (L) 10/23/2022 0336   PROT 7.1 10/23/2022 0336   ALBUMIN 3.6 10/23/2022 0336   AST 52 (H) 10/23/2022 0336   ALT 19 10/23/2022 0336   ALKPHOS 65 10/23/2022 0336   BILITOT 2.2 (H) 10/23/2022 0336   GFRNONAA >60 10/23/2022 0336   GFRAA >60 11/02/2019 2036     Lipid Panel  No results found for: "CHOL", "TRIG", "HDL", "CHOLHDL", "VLDL", "LDLCALC", "LDLDIRECT"   Imaging I have reviewed the images obtained:  CT-head- No evidence of acute intracranial abnormality. ASPECTS is 10.   CTA head and neck  1. No acute large vessel occlusion. 2. 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  CTA chest/ABD/ Pelvis  1. No acute vascular findings within the chest, abdomen or pelvis. 2. 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 This can be a  cause of epigastric abdominal pain. 3. No additional acute abdominopelvic process. 4. Hepatosplenomegaly. Additional incidental, chronic and senescent findings as above.  MRI examination of the brain--Normal brain MRI. No evidence of acute intracranial abnormality   Assessment:  MERCY MALENA is a 37 y.o. male with past medical history of ETOH abuse (12 beers daily), DTs with alcohol withdrawal,current smoker, peripheral neuropathy, DM, and GI bleed who presents to [facility] ED for evaluation of acute onset of blurry vision, followed by his heart racing with CP, became lightheaded and developed numbness and weakness of his left arm after taking his gabapentin. This is his 3rd presentation in 3 years for transient weakness of unclear etiology. Stroke ruled out by MRI brain.  Recommendations: - Cancel Code stroke  - Neurology will be available as needed   Beulah Gandy DNP, [place]  [facility]   Attending Neurohospitalist Addendum Patient seen and examined with APP/Resident. Agree with the history and physical as documented above. Agree with the plan as documented, which I helped formulate. I have edited the note above to reflect my full findings and recommendations. I have independently reviewed the chart, obtained history, review of systems and examined the patient.I have personally reviewed pertinent head/neck/spine imaging  (CT/MRI). Please feel free to call with any questions.  -- Su Monks, MD [facility] [phone]  If 7pm- 7am, please page neurology on call as listed in [place].

## 2022-12-18 NOTE — Discharge Instructions (Addendum)
Follow up with your neurologist, call to schedule an appointment. Librium taper as prescribed.  Consider follow-up with behavioral health urgent care as needed.

## 2022-12-18 NOTE — ED Notes (Signed)
Code stroke paged out per Triage request

## 2022-12-18 NOTE — ED Triage Notes (Signed)
Pt came in POV when this morning his vision became blurry & heart rate picked up. After laying down he noticed the Lt arm became numb & heavy, he is unaware if his legs were numb bc he already had neuropathy. A/Ox4. When he stood earlier he does endorse dizziness. Does have Hx of stroke last year, no deficits from it.

## 2022-12-26 ENCOUNTER — Telehealth: Payer: Self-pay | Admitting: Physician Assistant

## 2022-12-26 ENCOUNTER — Encounter: Payer: Self-pay | Admitting: Physician Assistant

## 2022-12-26 ENCOUNTER — Ambulatory Visit
Admission: EM | Admit: 2022-12-26 | Discharge: 2022-12-26 | Disposition: A | Discharge status: Home / Self Care | Payer: Self-pay

## 2022-12-26 DIAGNOSIS — F10939 Alcohol use, unspecified with withdrawal, unspecified: Secondary | ICD-10-CM

## 2022-12-26 NOTE — Progress Notes (Signed)
Virtual Visit Consent   Sedalia Muta, you are scheduled for a virtual visit with a [facility] provider today. Just as with appointments in the office, your consent must be obtained to participate. Your consent will be active for this visit and any virtual visit you may have with one of our providers in the next 365 days. If you have a MyChart account, a copy of this consent can be sent to you electronically.  As this is a virtual visit, video technology does not allow for your provider to perform a traditional examination. This may limit your provider's ability to fully assess your condition. If your provider identifies any concerns that need to be evaluated in person or the need to arrange testing (such as labs, EKG, etc.), we will make arrangements to do so. Although advances in technology are sophisticated, we cannot ensure that it will always work on either your end or our end. If the connection with a video visit is poor, the visit may have to be switched to a telephone visit. With either a video or telephone visit, we are not always able to ensure that we have a secure connection.  By engaging in this virtual visit, you consent to the provision of healthcare and authorize for your insurance to be billed (if applicable) for the services provided during this visit. Depending on your insurance coverage, you may receive a charge related to this service.  I need to obtain your verbal consent now. Are you willing to proceed with your visit today? Shane Holmes has provided verbal consent on 12/26/2022 for a virtual visit (video or telephone). Shane Holmes, [place]  Date: 12/26/2022 11:27 AM  Virtual Visit via Video Note   I, Shane Holmes, connected with  Shane Holmes  ([id number], May 07, 1986) on 12/26/22 at 10:45 AM EST by a video-enabled telemedicine application and verified that I am speaking with the correct person using two identifiers.  Location: Patient: Virtual Visit Location  Patient: Home Provider: Virtual Visit Location Provider: Home Office   I discussed the limitations of evaluation and management by telemedicine and the availability of in person appointments. The patient expressed understanding and agreed to proceed.    History of Present Illness: Shane Holmes is a 37 y.o. who identifies as a male who was assigned male at birth, and is being seen today for possibility of refill of Librium. Was recently in the ER after a fall and concern for stroke like symptoms. This was ruled out. Patient was trying to wean himself off of alcohol and to prefvent withdrawal the EDP gave him few day course of Librium which he has completed. Notes he tolerated well. Has been able to self wean from 8-12 beers a day down to 1-2. Is very proud of this. Denies any withdrawal symptoms at this point but still feels a slight craving. Is wanting to know what to do to get a longer course of medication since he does not have a PCP currently. His Neurologist encouraged him to reach out as they are not comfortable providing. HPI: HPI  Problems:  Patient Active Problem List   Diagnosis Date Noted   Uncontrolled type 2 diabetes mellitus with hyperglycemia, without long-term current use of insulin ([place]) 10/22/2022   Hematemesis 10/21/2022   Acute upper GI bleed 06/19/2022   Epigastric pain 06/19/2022   Hyperglycemia 06/19/2022   Leukocytosis 06/19/2022   Chronic anemia 06/19/2022   Neuropathy    Hematemesis with nausea    Transaminitis  History of COVID-19 01/29/2021   Weakness of both lower extremities    Hereditary and idiopathic peripheral neuropathy    Dyschezia    Anal fissure    Melena    Alcohol abuse    Acute blood loss anemia    Lower GI bleed 01/22/2021   Alcohol withdrawal syndrome with complication, with unspecified complication ([place]) 63/87/5643   COVID-19 virus infection 01/22/2021   Acute asthma exacerbation 01/22/2021   Hypokalemia 01/22/2021   Tobacco abuse  01/22/2021   Hyponatremia 01/22/2021   Dehydration 01/22/2021    Allergies:  Allergies  Allergen Reactions   Lidocaine-Glycerin Other (See Comments)    Redness to application site   Medications:  Current Outpatient Medications:    albuterol (VENTOLIN HFA) 108 (90 Base) MCG/ACT inhaler, INHALE 2 PUFFS INTO THE LUNGS EVERY SIX HOURS AS NEEDED FOR WHEEZING OR SHORTNESS OF BREATH. (Patient not taking: Reported on 10/22/2022), Disp: 18 g, Rfl: 0   folic acid (FOLVITE) 1 MG tablet, Take 1 tablet (1 mg total) by mouth daily., Disp: 30 tablet, Rfl: 0   gabapentin (NEURONTIN) 300 MG capsule, Take 2 capsules (600 mg total) by mouth 3 (three) times daily., Disp: 90 capsule, Rfl: 5   metFORMIN (GLUCOPHAGE) 500 MG tablet, Take 1 tablet (500 mg total) by mouth 2 (two) times daily with a meal., Disp: 60 tablet, Rfl: 2   pantoprazole (PROTONIX) 40 MG tablet, Take 1 tablet (40 mg total) by mouth 2 (two) times daily., Disp: 60 tablet, Rfl: 3   thiamine (VITAMIN B1) 100 MG tablet, Take 1 tablet (100 mg total) by mouth daily., Disp: 30 tablet, Rfl: 3  Observations/Objective: Patient is well-developed, well-nourished in no acute distress.  Resting comfortably at home.  Head is normocephalic, atraumatic.  No labored breathing. Speech is clear and coherent with logical content.  Patient is alert and oriented at baseline.   Assessment and Plan: 1. Alcohol withdrawal syndrome with complication, with unspecified complication ([place])  Seems over the bulk of this. He is coherent, alert and oriented x 3. Congratulated him on his progress. Unfortunately he is without PCP to help manage for now and can take a few months to get him set up. We cannot provide refill virtually since is a controlled medication. Gave him resources so he can be evaluated in person today for further management for now. Also encouraged him to have his Neurologist refer him to a specialist that can help with ongoing care until he gets in with a  new PCP.  No charge for visit.  Follow Up Instructions: I discussed the assessment and treatment plan with the patient. The patient was provided an opportunity to ask questions and all were answered. The patient agreed with the plan and demonstrated an understanding of the instructions.  A copy of instructions were sent to the patient via MyChart unless otherwise noted below.   The patient was advised to call back or seek an in-person evaluation if the symptoms worsen or if the condition fails to improve as anticipated.  Time:  I spent 10 minutes with the patient via telehealth technology discussing the above problems/concerns.    Shane Rio, PA-C

## 2022-12-26 NOTE — Patient Instructions (Addendum)
  Shane Holmes, thank you for joining Shane Rio, PA-C for today's virtual visit.  While this provider is not your primary care provider (PCP), if your PCP is located in our provider database this encounter information will be shared with them immediately following your visit.   [facility] account gives you access to today's visit and all your visits, tests, and labs performed at [facility] " click here if you don't have a [facility] account or go to [email or web address]  Consent: (Patient) Shane Holmes provided verbal consent for this virtual visit at the beginning of the encounter.  Current Medications:  Current Outpatient Medications:    albuterol (VENTOLIN HFA) 108 (90 Base) MCG/ACT inhaler, INHALE 2 PUFFS INTO THE LUNGS EVERY SIX HOURS AS NEEDED FOR WHEEZING OR SHORTNESS OF BREATH. (Patient not taking: Reported on 10/22/2022), Disp: 18 g, Rfl: 0   chlordiazePOXIDE (LIBRIUM) 25 MG capsule, 50mg  PO TID x 1D, then 25-50mg  PO BID X 1D, then 25-50mg  PO QD X 1D, Disp: 10 capsule, Rfl: 0   folic acid (FOLVITE) 1 MG tablet, Take 1 tablet (1 mg total) by mouth daily., Disp: 30 tablet, Rfl: 0   gabapentin (NEURONTIN) 300 MG capsule, Take 2 capsules (600 mg total) by mouth 3 (three) times daily., Disp: 90 capsule, Rfl: 5   metFORMIN (GLUCOPHAGE) 500 MG tablet, Take 1 tablet (500 mg total) by mouth 2 (two) times daily with a meal., Disp: 60 tablet, Rfl: 2   pantoprazole (PROTONIX) 40 MG tablet, Take 1 tablet (40 mg total) by mouth 2 (two) times daily., Disp: 60 tablet, Rfl: 3   thiamine (VITAMIN B1) 100 MG tablet, Take 1 tablet (100 mg total) by mouth daily., Disp: 30 tablet, Rfl: 3   Medications ordered in this encounter:  No orders of the defined types were placed in this encounter.    *If you need refills on other medications prior to your next appointment, please contact your pharmacy*  Follow-Up: Call back or seek an in-person evaluation if the  symptoms worsen or if the condition fails to improve as anticipated.  [place] [phone]  Other Instructions Please use the first link to get scheduled in person today. Take care.  If you have been instructed to have an in-person evaluation today at a local Urgent Care facility, please use the link below. It will take you to a list of all of our available [facility], including address, phone number and hours of operation. Please do not delay care.  [facility]  If you or a family member do not have a primary care provider, use the link below to schedule a visit and establish care. When you choose a [facility] primary care physician or advanced practice provider, you gain a long-term partner in health. Find a Primary Care Provider  Learn more about 's in-office and virtual care options: [facility]

## 2023-01-06 ENCOUNTER — Other Ambulatory Visit: Payer: Self-pay | Admitting: Neurology

## 2023-01-06 DIAGNOSIS — E1142 Type 2 diabetes mellitus with diabetic polyneuropathy: Secondary | ICD-10-CM

## 2023-01-06 DIAGNOSIS — M79604 Pain in right leg: Secondary | ICD-10-CM

## 2023-01-06 DIAGNOSIS — E519 Thiamine deficiency, unspecified: Secondary | ICD-10-CM

## 2023-01-06 DIAGNOSIS — R209 Unspecified disturbances of skin sensation: Secondary | ICD-10-CM

## 2023-01-06 DIAGNOSIS — G621 Alcoholic polyneuropathy: Secondary | ICD-10-CM

## 2023-01-06 MED ORDER — GABAPENTIN 300 MG PO CAPS: 600.0000 mg | ORAL_CAPSULE | Freq: Three times a day (TID) | ORAL | 11 refills | Status: DC

## 2023-01-06 MED ORDER — THIAMINE HCL 100 MG PO TABS: 100.0000 mg | ORAL_TABLET | Freq: Every day | ORAL | 11 refills | Status: AC

## 2023-01-28 ENCOUNTER — Other Ambulatory Visit: Payer: Self-pay

## 2023-02-03 ENCOUNTER — Other Ambulatory Visit: Payer: Self-pay

## 2023-02-03 DIAGNOSIS — R209 Unspecified disturbances of skin sensation: Secondary | ICD-10-CM

## 2023-02-03 DIAGNOSIS — M79604 Pain in right leg: Secondary | ICD-10-CM

## 2023-02-03 DIAGNOSIS — G629 Polyneuropathy, unspecified: Secondary | ICD-10-CM

## 2023-02-05 ENCOUNTER — Other Ambulatory Visit: Payer: Self-pay

## 2023-02-05 ENCOUNTER — Telehealth: Payer: Medicaid Other | Admitting: Physician Assistant

## 2023-02-05 ENCOUNTER — Encounter (HOSPITAL_COMMUNITY): Payer: Self-pay

## 2023-02-05 ENCOUNTER — Emergency Department (HOSPITAL_COMMUNITY)
Admission: EM | Admit: 2023-02-05 | Discharge: 2023-02-05 | Disposition: A | Discharge status: Home / Self Care | Payer: Medicaid Other | Attending: Emergency Medicine | Admitting: Emergency Medicine

## 2023-02-05 DIAGNOSIS — B353 Tinea pedis: Secondary | ICD-10-CM | POA: Insufficient documentation

## 2023-02-05 DIAGNOSIS — E1140 Type 2 diabetes mellitus with diabetic neuropathy, unspecified: Secondary | ICD-10-CM | POA: Insufficient documentation

## 2023-02-05 DIAGNOSIS — Z7984 Long term (current) use of oral hypoglycemic drugs: Secondary | ICD-10-CM | POA: Diagnosis not present

## 2023-02-05 DIAGNOSIS — G629 Polyneuropathy, unspecified: Secondary | ICD-10-CM

## 2023-02-05 DIAGNOSIS — M5441 Lumbago with sciatica, right side: Secondary | ICD-10-CM

## 2023-02-05 DIAGNOSIS — M79672 Pain in left foot: Secondary | ICD-10-CM | POA: Diagnosis present

## 2023-02-05 DIAGNOSIS — M545 Low back pain, unspecified: Secondary | ICD-10-CM

## 2023-02-05 LAB — CBG MONITORING, ED: Glucose-Capillary: 207 mg/dL — ABNORMAL HIGH (ref 70–99)

## 2023-02-05 MED ORDER — CLOTRIMAZOLE 1 % EX CREA: TOPICAL_CREAM | CUTANEOUS | 0 refills | Status: DC

## 2023-02-05 MED ORDER — NORTRIPTYLINE HCL 25 MG PO CAPS: 25.0000 mg | ORAL_CAPSULE | Freq: Every day | ORAL | 0 refills | Status: DC

## 2023-02-05 MED ORDER — HYDROCODONE-ACETAMINOPHEN 5-325 MG PO TABS
1.0000 | ORAL_TABLET | Freq: Once | ORAL | Status: AC
  Administered 2023-02-05: 1 via ORAL
  Filled 2023-02-05: qty 1

## 2023-02-05 NOTE — ED Triage Notes (Signed)
Bilateral foot pain that radiates into hips that started a few days ago. Pt has hx of neuropathy and takes gabapentin. Worse with ambulation.

## 2023-02-05 NOTE — Progress Notes (Signed)
Because you are having severe back pain rated worst possible, I feel your condition warrants further evaluation and I recommend that you be seen in a face to face visit.   NOTE: There will be NO CHARGE for this eVisit   If you are having a true medical emergency please call 911.      For an urgent face to face visit, [facility] has eight urgent care centers for your convenience:   NEW!! [facility] [place] [facility] [place] Get Driving Directions [phone] [address] [place], [place] [facility] [place] at [place] Get Driving Directions [id number] [place], [place] [address]   [place] [facility]) Get Driving Directions [id number] [address] [place], [place] [address]  [place] [facility] [place]) Get Driving Directions [id number] [address] [place],  [place]  [address]  [place] [facility] - at [place] Get Driving Directions  [phone] [address] [place],  [place] [address]   [facility] Get Driving Directions [id number] [place], [place], [place] [address]   [facility] at [facility] Get Driving Directions  [id number] [address] [place], [place] [address]   [place] [facility] at [place] Get Driving Directions [id number] [address] [place], [place] [address]  Your MyChart E-visit questionnaire answers were reviewed by a board certified advanced clinical practitioner to complete your personal care plan based on your specific symptoms.  Thank you for using e-Visits.    I have spent 5 minutes in review of e-visit questionnaire, review and updating patient chart, medical decision making and response to patient.   Mar Daring, PA-C

## 2023-02-05 NOTE — Discharge Instructions (Signed)
Regimen as prescribed. Take nortriptyline nightly.  This is a medication that can be titrated up with discussion with your doctor. You can consider applying capsaicin cream to your feet or lidocaine patches as well.

## 2023-02-05 NOTE — ED Provider Notes (Signed)
[facility] Provider Note   CSN: [id number] Arrival date & time: 02/05/23  2123     History {Add pertinent medical, surgical, social history, OB history to HPI:1} Chief Complaint  Patient presents with   Foot Pain    Bilateral    [facility] is a 37 y.o. male.  37 year old male with history of diabetes and neuropathy presents with complaint of severe bilateral foot pain without fall or injury.  Patient has been going to his PCP and neurologist who are prescribing 6 or milligrams 3 times a day and reports severe pain without relief, not scheduled to see pain management until next week Wednesday.       Home Medications Prior to Admission medications   Medication Sig Start Date End Date Taking? Authorizing Provider  albuterol (VENTOLIN HFA) 108 (90 Base) MCG/ACT inhaler INHALE 2 PUFFS INTO THE LUNGS EVERY SIX HOURS AS NEEDED FOR WHEEZING OR SHORTNESS OF BREATH. Patient not taking: Reported on 10/22/2022 01/26/21 08/27/22  Jonetta Osgood, MD  gabapentin (NEURONTIN) 300 MG capsule Take 2 capsules (600 mg total) by mouth 3 (three) times daily. 01/06/23   Shellia Carwin, MD  metFORMIN (GLUCOPHAGE) 500 MG tablet Take 1 tablet (500 mg total) by mouth 2 (two) times daily with a meal. 10/23/22 01/21/23  Barb Merino, MD  pantoprazole (PROTONIX) 40 MG tablet Take 1 tablet (40 mg total) by mouth 2 (two) times daily. 10/23/22   Barb Merino, MD  thiamine (VITAMIN B1) 100 MG tablet Take 1 tablet (100 mg total) by mouth daily. 01/06/23   Shellia Carwin, MD  dicyclomine (BENTYL) 20 MG tablet Take 1 tablet (20 mg total) by mouth 2 (two) times daily. Patient not taking: Reported on 11/02/2019 03/02/14 11/03/19  Margarita Mail, PA-C      Allergies    Lidocaine-glycerin    Review of Systems   Review of Systems Negative except as per HPI Physical Exam Updated Vital Signs BP (!) 145/95 (BP Location: Right Arm)   Pulse (!) 126   Temp 98.2 F (36.8  C) (Oral)   Resp 20   Ht '5\' 11"'$  (1.803 m)   Wt 86.2 kg   SpO2 98%   BMI 26.50 kg/m  Physical Exam Vitals and nursing note reviewed.  Constitutional:      General: He is not in acute distress.    Appearance: He is well-developed. He is not diaphoretic.  HENT:     Head: Normocephalic and atraumatic.  Cardiovascular:     Pulses: Normal pulses.  Pulmonary:     Effort: Pulmonary effort is normal.  Musculoskeletal:        General: Tenderness present. No swelling or deformity.     Right lower leg: No edema.     Left lower leg: No edema.  Feet:     Right foot:     Skin integrity: Dry skin present.     Left foot:     Skin integrity: Dry skin present.     Comments: Diffuse foot pain, skin intact Skin:    General: Skin is warm and dry.     Findings: Rash present.  Neurological:     Mental Status: He is alert and oriented to person, place, and time.     Sensory: No sensory deficit.     Motor: No weakness.  Psychiatric:        Behavior: Behavior normal.     ED Results / Procedures / Treatments   Labs (  all labs ordered are listed, but only abnormal results are displayed) Labs Reviewed - No data to display  EKG None  Radiology No results found.  Procedures Procedures  {Document cardiac monitor, telemetry assessment procedure when appropriate:1}  Medications Ordered in ED Medications - No data to display  ED Course/ Medical Decision Making/ A&P   {   Click here for ABCD2, HEART and other calculatorsREFRESH Note before signing :1}                          Medical Decision Making  This patient presents to the ED for concern of foot pain, this involves an extensive number of treatment options, and is a complaint that carries with it a high risk of complications and morbidity.  The differential diagnosis includes but not limited to neuropathy,    Co morbidities that complicate the patient evaluation  ***   Additional history obtained:  Additional history  obtained from *** External records from outside source obtained and reviewed including ***   Lab Tests:  I Ordered, and personally interpreted labs.  The pertinent results include:  ***   Imaging Studies ordered:  I ordered imaging studies including ***  I independently visualized and interpreted imaging which showed *** I agree with the radiologist interpretation   Cardiac Monitoring: / EKG:  The patient was maintained on a cardiac monitor.  I personally viewed and interpreted the cardiac monitored which showed an underlying rhythm of: ***   Consultations Obtained:  I requested consultation with the ***,  and discussed lab and imaging findings as well as pertinent plan - they recommend: ***   Problem List / ED Course / Critical interventions / Medication management  *** I ordered medication including ***  for ***  Reevaluation of the patient after these medicines showed that the patient {resolved/improved/worsened:23923::"improved"} I have reviewed the patients home medicines and have made adjustments as needed   Social Determinants of Health:  ***   Test / Admission - Considered:  ***   {Document critical care time when appropriate:1} {Document review of labs and clinical decision tools ie heart score, Chads2Vasc2 etc:1}  {Document your independent review of radiology images, and any outside records:1} {Document your discussion with family members, caretakers, and with consultants:1} {Document social determinants of health affecting pt's care:1} {Document your decision making why or why not admission, treatments were needed:1} Final Clinical Impression(s) / ED Diagnoses Final diagnoses:  None    Rx / DC Orders ED Discharge Orders     None

## 2023-02-12 ENCOUNTER — Emergency Department (HOSPITAL_COMMUNITY)
Admission: EM | Admit: 2023-02-12 | Discharge: 2023-02-13 | Discharge status: Against Medical Advice | Payer: Medicaid Other | Attending: Emergency Medicine | Admitting: Emergency Medicine

## 2023-02-12 DIAGNOSIS — R000 Tachycardia, unspecified: Secondary | ICD-10-CM | POA: Insufficient documentation

## 2023-02-12 DIAGNOSIS — M79606 Pain in leg, unspecified: Secondary | ICD-10-CM | POA: Diagnosis not present

## 2023-02-12 DIAGNOSIS — R519 Headache, unspecified: Secondary | ICD-10-CM | POA: Diagnosis present

## 2023-02-12 DIAGNOSIS — Z5321 Procedure and treatment not carried out due to patient leaving prior to being seen by health care provider: Secondary | ICD-10-CM | POA: Insufficient documentation

## 2023-02-13 ENCOUNTER — Encounter (HOSPITAL_COMMUNITY): Payer: Self-pay

## 2023-02-13 ENCOUNTER — Other Ambulatory Visit: Payer: Self-pay

## 2023-02-13 LAB — COMPREHENSIVE METABOLIC PANEL
ALT: 14 U/L (ref 0–44)
AST: 21 U/L (ref 15–41)
Albumin: 4.6 g/dL (ref 3.5–5.0)
Alkaline Phosphatase: 64 U/L (ref 38–126)
Anion gap: 14 (ref 5–15)
BUN: 5 mg/dL — ABNORMAL LOW (ref 6–20)
CO2: 15 mmol/L — ABNORMAL LOW (ref 22–32)
Calcium: 8.8 mg/dL — ABNORMAL LOW (ref 8.9–10.3)
Chloride: 103 mmol/L (ref 98–111)
Creatinine, Ser: 0.57 mg/dL — ABNORMAL LOW (ref 0.61–1.24)
GFR, Estimated: >60 mL/min (ref >60)
Glucose, Bld: 280 mg/dL — ABNORMAL HIGH (ref 70–99)
Potassium: 4.2 mmol/L (ref 3.5–5.1)
Sodium: 132 mmol/L — ABNORMAL LOW (ref 135–145)
Total Bilirubin: 0.8 mg/dL (ref 0.3–1.2)
Total Protein: 8.7 g/dL — ABNORMAL HIGH (ref 6.5–8.1)

## 2023-02-13 LAB — CBC WITH DIFFERENTIAL/PLATELET
Abs Immature Granulocytes: 0.06 10*3/uL (ref 0.00–0.07)
Basophils Absolute: 0 10*3/uL (ref 0.0–0.1)
Basophils Relative: 0 %
Eosinophils Absolute: 0 10*3/uL (ref 0.0–0.5)
Eosinophils Relative: 0 %
HCT: 45.4 % (ref 39.0–52.0)
Hemoglobin: 14.7 g/dL (ref 13.0–17.0)
Immature Granulocytes: 0 %
Lymphocytes Relative: 8 %
Lymphs Abs: 1 10*3/uL (ref 0.7–4.0)
MCH: 27.8 pg (ref 26.0–34.0)
MCHC: 32.4 g/dL (ref 30.0–36.0)
MCV: 86 fL (ref 80.0–100.0)
Monocytes Absolute: 0.1 10*3/uL (ref 0.1–1.0)
Monocytes Relative: 1 %
Neutro Abs: 12.7 10*3/uL — ABNORMAL HIGH (ref 1.7–7.7)
Neutrophils Relative %: 91 %
Platelets: 216 10*3/uL (ref 150–400)
RBC: 5.28 MIL/uL (ref 4.22–5.81)
RDW: 15.4 % (ref 11.5–15.5)
WBC: 14 10*3/uL — ABNORMAL HIGH (ref 4.0–10.5)
nRBC: 0 % (ref 0.0–0.2)

## 2023-02-13 LAB — TROPONIN I (HIGH SENSITIVITY): Troponin I (High Sensitivity): <2 ng/L (ref <18)

## 2023-02-13 NOTE — ED Triage Notes (Signed)
Arrives [facility] from home with c/o severe leg pain.   Says he was seen for similar last week and started taking 2 new medications yesterday but not 100% which ones but thinks its the Duloxetine and Naltrexone.   Began feeling funny last night. Attempted to eat some food and get rid of the feeling. Then just before 8PM began having leg pains and inability to loft them. Denies back injury. Denies loss of bowel or bladder.

## 2023-02-13 NOTE — ED Notes (Signed)
Request reevaluation from waiting area.   Developed severe headache, a heart racing and pounding sensation and worsening pain.

## 2023-02-27 ENCOUNTER — Other Ambulatory Visit: Payer: Self-pay | Admitting: Neurology

## 2023-02-27 DIAGNOSIS — K920 Hematemesis: Secondary | ICD-10-CM

## 2023-02-27 DIAGNOSIS — E1142 Type 2 diabetes mellitus with diabetic polyneuropathy: Secondary | ICD-10-CM

## 2023-02-27 MED ORDER — METFORMIN HCL 500 MG PO TABS: 500.0000 mg | ORAL_TABLET | Freq: Two times a day (BID) | ORAL | 2 refills | Status: AC

## 2023-02-27 MED ORDER — PANTOPRAZOLE SODIUM 40 MG PO TBEC: 40.0000 mg | DELAYED_RELEASE_TABLET | Freq: Two times a day (BID) | ORAL | 3 refills | Status: DC

## 2023-02-27 NOTE — Progress Notes (Signed)
I saw Shane Holmes in neurology clinic on 03/06/23 in follow up for neuropathy.  HPI: Shane Holmes is a 37 y.o. year old male with a history of DM, asthma, EtOH abuse, back pain  who we last saw on 08/27/22.  To briefly review: Patient's symptoms started about 1.5 years ago (~beginning of 2022). He had gradually had muscle aches that worsened. He had to be hospitalized about 1.5 years ago for this. He was doing better, but then things progressed. He currently has numbness and electric sensations at least from his thighs to the tips of feet. Patient has a lot of cramps in his legs, particularly in the hamstrings. The spasms occur only at night, when he lays down. They were very consistent until more recently as they have calmed and become more random now. He has great difficulty walking, especially in the mornings. He feels pressure when walking. He denies any significant change in back pain.   Patient was seen in the ED 06/07/22 for muscle spasms. No acute abnormalities were found, so patient was referred to outpatient neurology.   Patient was previously evaluated by inpatient neurology, first in 01/24/21 when he bilateral leg weakness in the setting of COVID + and GI bleed. There was some concern for GBS so MRI and LP were performed. LP was normal. MRI showed chronic L5 pars fractures with degeneration but no spinal stenosis or neural impingement. Patient was diagnosed with alcoholic polyneuropathy and asked to follow up with neurology as outpatient, which he never did.    He was seen again by inpatient neurology on 01/09/22 for acute onset BLE numbness and weakness and RUE weakness and numbness. Exam was suggestive of functional weakness at that time. MRI cervical and thoracic spine was recommended, but patient would not lay still for MRI. He left the ED prior to being discharged.   The patient has not had similar episodes of symptoms in the past.    Any change in urine color, especially after  exertion/physical activity? No   The patient has not noticed any recent skin rashes nor does he report any constitutional symptoms like fever, night sweats, anorexia or unintentional weight loss.   EtOH use: Currently drinks 6 beers per day; 12 beers per day (per patient report in 2022) Restrictive diet? No Family history of neuropathy/myopathy/NM disease? 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 Given his HbA1c, DM may also be contributing to his symptoms. Diabetic amyotrophy or diabetic lumbosacral radiculoplexopathy is less likely given the symmetric and distal distribution. Thiamine deficiency is another possible contributing factor. I will look for other treatable causes of neuropathy and spasms and get an EMG.   PLAN: -Blood work: ionized Ca, Vit D, PTH, IFE, B1, MMA, iron studies (iron, TIBC, sat %, and ferritin) -EMG: RLE > LLE (PN vs plexus) -Recommend taking thiamine as previously prescribed 100 mg -Discussed cutting back and quitting EtOH, but not cold [place] to avoid EtOH withdrawal -Recommended lidocaine cream for pain for now. Patient currently does not have an ID and preferred over the counter recommendations first. -OTC cramp recommendations given including tonic water, mag oxide 400 mg BID, hydration, and stretching  Since their last visit: Labs were significant for low B1. I recommended supplementation with 100 mg daily of thiamine. He is taking this as well as vit D supplement.  EMG on 09/30/22 showed severe axonal large  fiber sensorimotor polyneuropathy with active denervation changes seen in distal muscles.  Since last visit, patient has been to the ED many times for different problems, but multiple times with pain in legs.   Gabapentin has helped with leg jerking. Hydrocodone was prescribed by pain management (seen by  [facility]). Patient tried Cymbalta but had very bad reaction (he could not move and had to go to the hospital). He was also prescribed Nortriptyline 25 mg by ED but this did not help.  He is establishing care with PCP on Monday (also [facility]) for DM.  Currently he is drinking 2-3 drinks per day (previously about 6 beers per day). He has cut out liquor completely.  Patient denies falls, but girlfriend mentions he has fallen a couple of times.   MEDICATIONS:  Outpatient Encounter Medications as of 03/06/2023  Medication Sig   albuterol (VENTOLIN HFA) 108 (90 Base) MCG/ACT inhaler INHALE 2 PUFFS INTO THE LUNGS EVERY SIX HOURS AS NEEDED FOR WHEEZING OR SHORTNESS OF BREATH. (Patient not taking: Reported on 10/22/2022)   clotrimazole (LOTRIMIN) 1 % cream Apply to affected area 2 times daily   gabapentin (NEURONTIN) 300 MG capsule Take 2 capsules (600 mg total) by mouth 3 (three) times daily.   metFORMIN (GLUCOPHAGE) 500 MG tablet Take 1 tablet (500 mg total) by mouth 2 (two) times daily with a meal.   nortriptyline (PAMELOR) 25 MG capsule Take 1 capsule (25 mg total) by mouth at bedtime for 21 days.   pantoprazole (PROTONIX) 40 MG tablet Take 1 tablet (40 mg total) by mouth 2 (two) times daily.   thiamine (VITAMIN B1) 100 MG tablet Take 1 tablet (100 mg total) by mouth daily.   [DISCONTINUED] dicyclomine (BENTYL) 20 MG tablet Take 1 tablet (20 mg total) by mouth 2 (two) times daily. (Patient not taking: Reported on 11/02/2019)   [DISCONTINUED] metFORMIN (GLUCOPHAGE) 500 MG tablet Take 1 tablet (500 mg total) by mouth 2 (two) times daily with a meal.   [DISCONTINUED] pantoprazole (PROTONIX) 40 MG tablet Take 1 tablet (40 mg total) by mouth 2 (two) times daily.   No facility-administered encounter medications on file as of 03/06/2023.    PAST MEDICAL HISTORY: Past Medical History:  Diagnosis Date   Alcohol abuse    Asthma    Back pain    Bulging lumbar disc    COVID    01/2021    Encounter for lumbar puncture    Hematochezia    Hypokalemia    Hyponatremia    Lower GI bleed    Neuropathy     PAST SURGICAL HISTORY: Past Surgical History:  Procedure Laterality Date   BIOPSY  06/19/2022   Procedure: BIOPSY;  Surgeon: Doran Stabler, MD;  Location: [place];  Service: Gastroenterology;;   ESOPHAGOGASTRODUODENOSCOPY (EGD) WITH PROPOFOL N/A 06/19/2022   Procedure: ESOPHAGOGASTRODUODENOSCOPY (EGD) WITH PROPOFOL;  Surgeon: Doran Stabler, MD;  Location: [place];  Service: Gastroenterology;  Laterality: N/A;    ALLERGIES: Allergies  Allergen Reactions   Lidocaine-Glycerin Other (See Comments)    Redness to application site    FAMILY HISTORY: Family History  Problem Relation Age of Onset   Cirrhosis Mother    Cirrhosis Father    Cancer Maternal Grandfather    Cancer Paternal Grandfather    Diabetes Maternal Aunt    Hypertension Neg Hx     SOCIAL HISTORY: Social History   Tobacco Use   Smoking status: Some Days    Types: Cigarettes  Smokeless tobacco: Never   Tobacco comments:    Close to a pack at day  Vaping Use   Vaping Use: Never used  Substance Use Topics   Alcohol use: Yes    Alcohol/week: 6.0 standard drinks of alcohol    Types: 6 Cans of beer per week    Comment: 1-2 beers   Drug use: Yes    Comment: cocaine positive 01/2022/ CbD   Social History   Social History Narrative   Right handed   Caffeine 1 -3 can a day   Live in one level home      Are you currently employed ?    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   Who lives with you? Girlfriend   What type of home do you live in: 1 story or 2 story? one        Objective:  Vital Signs:  BP 116/86   Pulse (!) 131   Ht 5\' 11"  (1.803 m)   Wt 193 lb (87.5 kg)   SpO2 98%   BMI 26.92 kg/m   General: General appearance: Awake and alert. No distress. Cooperative with exam.  Skin: No obvious rash or jaundice. HEENT:  Atraumatic. Anicteric. Lungs: Non-labored breathing on room air  Extremities: No edema. No obvious deformity.  Musculoskeletal: No obvious joint swelling.  Neurological: Mental Status: Alert. Speech fluent. No pseudobulbar affect Cranial Nerves: CNII: No RAPD. Visual fields intact. CNIII, IV, VI: PERRL. No nystagmus. EOMI. CN V: Facial sensation intact bilaterally to fine touch. CN VII: Facial muscles symmetric and strong. No ptosis at rest CN VIII: Hears finger rub well bilaterally. CN IX: No hypophonia. CN X: Palate elevates symmetrically. CN XI: Full strength shoulder shrug bilaterally. CN XII: Tongue protrusion full and midline. No atrophy or fasciculations. No significant dysarthria Motor: Tone is normal.  Individual muscle group testing (MRC grade out of 5):  Movement     Neck flexion 5    Neck extension 5     Right Left   Shoulder abduction 5 5   Shoulder adduction 5 5   Elbow flexion 5 5   Elbow extension 5 5   Finger abduction - FDI 5 5   Finger abduction - ADM 5 5   Finger extension 5 5   Finger flexion 5 5    Hip flexion 5- 5-   Hip extension 5- 5-   Hip adduction 5 5   Hip abduction 5 5   Knee extension 5 5   Knee flexion 5- 5-   Dorsiflexion 4+ 4+   Plantarflexion 5- 5-     Reflexes:  Right Left  Bicep 1+ 1+  Tricep Trace Trace  BrRad Trace Trace  Knee 0 0  Ankle 0 0   Sensation: Pinprick: Absent to knees in bilateral lower extremities Coordination: Intact finger-to- nose-finger bilaterally. Romberg with sway. Gait: Difficulty with rising from chair with arms crossed unassisted. Antalgic wide based gait.    Lab and Test Review: New results: 08/27/22: Vit D wnl PTH wnl IFE no M protein Iron studies: ferritin low at 37 Ionized Ca: mildly low 4.6 (normal is 4.7) B1: < 6  EtOH (10/21/22): 23 EtOH (12/18/22): 46 CMP (02/13/23): Component     Latest Ref Rng 02/13/2023  Sodium     135 - 145 mmol/L 132 (L)   Potassium     3.5 - 5.1 mmol/L  4.2   Chloride     98 -  111 mmol/L 103   CO2     22 - 32 mmol/L 15 (L)   Glucose     70 - 99 mg/dL 280 (H)   BUN     6 - 20 mg/dL 5 (L)   Creatinine     0.61 - 1.24 mg/dL 0.57 (L)   Calcium     8.9 - 10.3 mg/dL 8.8 (L)   Total Protein     6.5 - 8.1 g/dL 8.7 (H)   Albumin     3.5 - 5.0 g/dL 4.6   AST     15 - 41 U/L 21   ALT     0 - 44 U/L 14   Alkaline Phosphatase     38 - 126 U/L 64   Total Bilirubin     0.3 - 1.2 mg/dL 0.8   GFR, Estimated     >60 mL/min >60   Anion gap     5 - 15  14      Previously reviewed results: Normal or unremarkable: CK (59)  06/20/22: CBC significant for anemia (Hb of 9.5), platelets low at 112 BMP significant for glucose to 132, calcium to 8.1 B12: 449 HbA1c: 7.4   EtOH (in ED on 06/07/22): 202, (01/09/22): 274, (11/02/19): 211   MRI lumbar spine w/wo contrast (01/25/21): FINDINGS: Segmentation:  Normal on the 2020 CT.   Alignment: Normal lumbar lordosis. Stable vertebral height and alignment since 2010.   Vertebrae: No marrow edema or evidence of acute osseous abnormality. Visualized bone marrow signal is within normal limits. Intact visible sacrum and SI joints.   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 No associated spondylolisthesis at that level. See additional facet details below.   Conus medullaris and cauda equina: Conus extends to the T12-L1 level. No lower spinal cord or conus signal abnormality. Small fatty filum terminalis (series 9, image 27, normal variant). Cauda equina nerve roots appear normal. No abnormal intradural enhancement or dural thickening.   Paraspinal and other soft tissues: Small area of patchy abnormal STIR signal and subtle soft tissue enhancement in the deep left paraspinal erector spinae muscles adjacent to the L4-L5 posterior elements (series 6, image 11 and series 10, image 31). No fluid collection, and the other bilateral paraspinal muscles remain normal. This area was  normal on the 2010 MRI. No underlying acute osseous abnormality.   Negative visible abdominal viscera.   Disc levels:   T11-T12: Partially visible and negative.   T12-L1:  Negative.   L1-L2:  Negative.   L2-L3:  Negative.   L3-L4:  Negative.   L4-L5: Negative disc. Mild to moderate facet and ligament flavum hypertrophy. Trace degenerative facet joint fluid on the left, which is in proximity to the small area of abnormal paraspinal soft tissue described earlier. But there is no marrow edema in the facets. No stenosis.   L5-S1: Negative disc. Mild facet hypertrophy with trace degenerative facet joint fluid. No stenosis.   IMPRESSION: 1. Small 2 cm area of soft tissue inflammation/myositis in the left erector spinous muscle at L4-L5. There is regional chronic L4-L5 facet degeneration (see #2) but no associated osseous abnormality and no other acute or inflammatory process in the lumbar spine.   2. 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 But normal lumbar discs with no spinal stenosis or neural impingement.   MRI cervical and thoracic spine wo contrast (01/09/22): FINDINGS: MRI CERVICAL SPINE FINDINGS   Alignment: Examination degraded by motion artifact.  Physiologic with preservation of the normal cervical lordosis. No listhesis.   Vertebrae: Vertebral body height maintained without acute or chronic fracture. Bone marrow signal intensity within normal limits. No discrete or worrisome osseous lesions or abnormal marrow edema.   Cord: Grossly normal signal and morphology. No convincing cord signal changes on this motion degraded exam.   Posterior Fossa, vertebral arteries, paraspinal tissues: Unremarkable.   Disc levels:   No significant disc pathology seen within the cervical spine. No disc bulge or focal disc herniation. No significant facet disease. No canal or neural foraminal stenosis or evidence for neural impingement.   MRI  THORACIC SPINE FINDINGS   Alignment:  Examination degraded by motion artifact.   Vertebral bodies normally aligned with preservation of the normal thoracic kyphosis. No listhesis.   Vertebrae: Minimal chronic height loss noted at the superior endplate of T3. Otherwise, vertebral body height maintained. Underlying bone marrow signal intensity within normal limits. No discrete or worrisome osseous lesions. No abnormal marrow edema.   Cord: There is slight anterior/ventral displacement of the upper thoracic spinal cord at the level of T4-5 (series 9, image 11). Posterior aspect of the cord is flattened (series 12, image 14). Finding raises the possibility for a subtle extramedullary, intradural lesion at this location, possibly an arachnoid cyst, although this is not definitely seen on this motion degraded exam. No definite dural defect, although a possible focal spinal cord herniation could also be considered. No visible associated cord signal changes on this motion degraded exam. Thoracic spinal cord is otherwise normal in appearance.   Paraspinal and other soft tissues: Unremarkable.   Disc levels:   Small chronic degenerative endplate Schmorl's node noted at the inferior endplate of T9. Otherwise, no other significant disc pathology seen within the thoracic spine. No disc bulge or focal disc herniation. No spinal stenosis. Foramina remain patent.   IMPRESSION: 1. Motion degraded exam. 2. Slight anterior/ventral displacement of the upper thoracic spinal cord at the level of T4-5. Finding raises the possibility for a subtle extramedullary, intradural lesion at this location, possibly an arachnoid cyst, although this is not definitely seen on this motion degraded exam. Possible focal spinal cord herniation could also be considered. No visible associated cord signal changes. Further evaluation with dedicated CT myelography may be helpful for further evaluation as warranted.  Postcontrast imaging could also be considered. 3. Otherwise unremarkable and normal MRI of the cervical and thoracic spine. No significant disc pathology, stenosis, or evidence for neural impingement.   MRI cervical, thoracic, and lumbar spine (01/10/22): FINDINGS: MRI CERVICAL SPINE FINDINGS   Alignment: Normal   Vertebrae: No abnormal enhancement.   Cord: No abnormal intrathecal enhancement   Posterior Fossa, vertebral arteries, paraspinal tissues: Negative for perispinal mass or inflammation.   Disc levels:   No neural impingement.   MRI THORACIC SPINE FINDINGS   Alignment:  Negative for listhesis.   Vertebrae: No abnormal vertebral enhancement.   Cord: No abnormal intrathecal enhancement. Specifically, no mass is seen at the level of dorsal cord mass effect at T4-5. No herniation is seen at this level either.   Paraspinal and other soft tissues: Negative for perispinal mass or inflammation. Hiatal hernia.   Disc levels:   No impingement   MRI LUMBAR SPINE FINDINGS   Segmentation:  5 lumbar type vertebrae   Alignment:  Borderline anterolisthesis at L5-S1   Vertebrae: Chronic bilateral L5 pars defects without marrow edema. Negative for aggressive bone lesion or abnormal enhancement   Conus medullaris: Extends to  the L1 level and appears normal.   Paraspinal and other soft tissues: Negative for perispinal mass or inflammation   Disc levels:   Diffusely preserved disc height and hydration. Negative facets. No neural impingement.   IMPRESSION: 1. 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 No underlying intrathecal mass, cord herniation, or loss of CSF flow voids- appearance usually from arachnoid web. 2. Normal cord and cauda equina signal. 3. Chronic L5 pars defects.   MRI brain w/wo contrast (01/10/22): FINDINGS: Brain: No acute infarction, hemorrhage, hydrocephalus, extra-axial collection or mass lesion. No white matter disease, atrophy, or abnormal  enhancement   Vascular: Normal flow voids and vascular enhancement   Skull and upper cervical spine: Is normal marrow signal   Sinuses/Orbits: Unremarkable   Other: Intermittent motion artifact.   IMPRESSION: Normal MRI of the brain.  ASSESSMENT: This is Sedalia Muta, a 37 y.o. 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 His neuropathic symptoms have improved but are likely still being exacerbated by ongoing EtOH use and DM.  Plan: -Continue gabapentin 600 mg TID -Continue to follow up with pain management -Establish care with PCP as planned -Discussed EtOH cutting back and DM management  Return to clinic in 6 months  Total time spent reviewing records, interview, history/exam, documentation, and coordination of care on day of encounter:  30 min  Kai Levins, MD

## 2023-03-06 ENCOUNTER — Ambulatory Visit: Payer: Medicaid Other | Admitting: Neurology

## 2023-03-06 ENCOUNTER — Encounter: Payer: Self-pay | Admitting: Neurology

## 2023-03-06 VITALS — BP 116/86 | HR 131 | Ht 71.0 in | Wt 193.0 lb

## 2023-03-06 DIAGNOSIS — E1142 Type 2 diabetes mellitus with diabetic polyneuropathy: Secondary | ICD-10-CM | POA: Diagnosis not present

## 2023-03-06 DIAGNOSIS — G621 Alcoholic polyneuropathy: Secondary | ICD-10-CM

## 2023-03-06 DIAGNOSIS — M79604 Pain in right leg: Secondary | ICD-10-CM

## 2023-03-06 DIAGNOSIS — R209 Unspecified disturbances of skin sensation: Secondary | ICD-10-CM

## 2023-03-06 DIAGNOSIS — G629 Polyneuropathy, unspecified: Secondary | ICD-10-CM

## 2023-03-06 DIAGNOSIS — E519 Thiamine deficiency, unspecified: Secondary | ICD-10-CM

## 2023-03-06 DIAGNOSIS — M79605 Pain in left leg: Secondary | ICD-10-CM

## 2023-03-06 DIAGNOSIS — R29898 Other symptoms and signs involving the musculoskeletal system: Secondary | ICD-10-CM

## 2023-03-06 NOTE — Patient Instructions (Addendum)
Plan: -Continue gabapentin 600 mg TID -Continue to follow up with pain management -Establish care with PCP as planned  Continue cutting back on alcohol.   Return to clinic in 6 months  Please let me know if you have any questions or concerns in the meantime.   The physicians and staff at [facility] are committed to providing excellent care. You may receive a survey requesting feedback about your experience at our office. We strive to receive "very good" responses to the survey questions. If you feel that your experience would prevent you from giving the office a "very good " response, please contact our office to try to remedy the situation. We may be reached at [phone]. Thank you for taking the time out of your busy day to complete the survey.  Kai Levins, MD [facility]

## 2023-04-09 IMAGING — MR MR LUMBAR SPINE WO/W CM
4 of 7 series · 25 of 48 positions shown · IV contrast (gadavist)
Comparison: Precontrast cervical and thoracic MRI from yesterday.
Lumbar MRI 02/02/2009

CLINICAL DATA: Acute myelopathy.

EXAM:
MRI LUMBAR SPINE WITHOUT AND WITH CONTRAST
MRI THORACIC AND CERVICAL SPINE WITH CONTRAST
TECHNIQUE: Multisequence MR imaging of the spine from the cervical spine to the
sacrum was performed prior to and following IV contrast
administration for evaluation of spinal metastatic disease.
CONTRAST:  8mL GADAVIST GADOBUTROL 1 MMOL/ML IV SOLN

[Series 28: T2 · sagittal · 4.0mm · 0.73mm/px · 3 of 16 slices shown (1 of 2)]
[im 1/16]
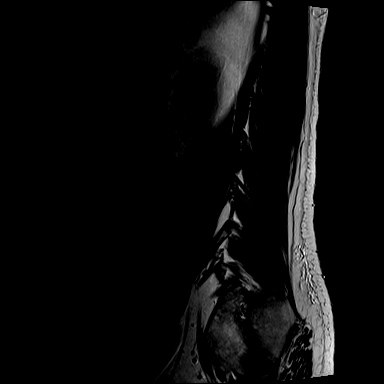
[im 8/16]
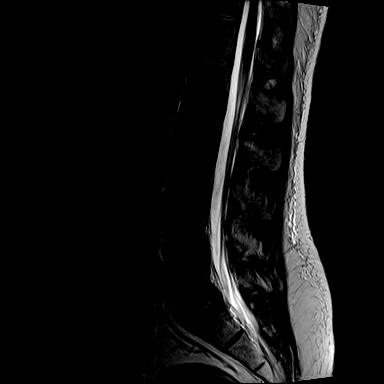
[im 16/16]
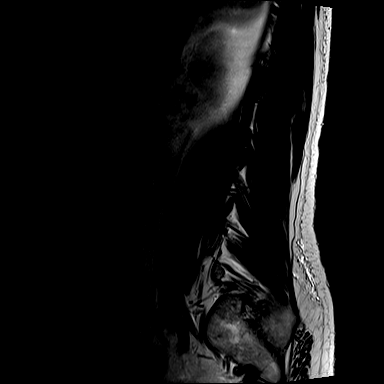

[Series 30: T1 · sagittal · 4.0mm · 0.88mm/px · 4 of 16 slices shown (1 of 2)]
[im 1/16]
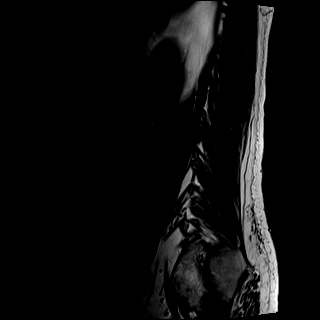
[im 6/16]
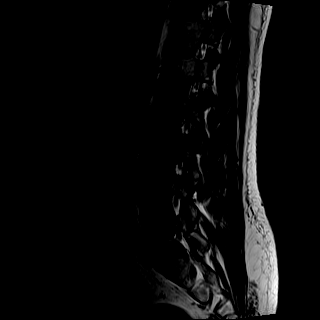
[im 11/16]
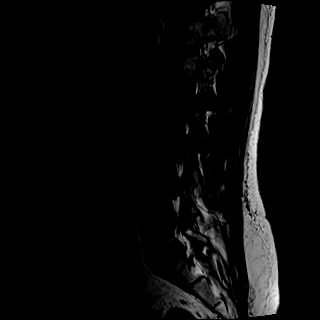
[im 16/16]
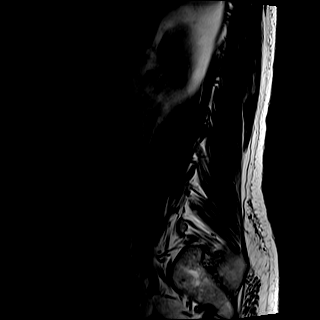

[Series 31: T2 · axial · 4.0mm · 0.69mm/px · z∈[-681,-434]mm · 11 of 48 slices shown (2 of 2)]
[im 1/48]
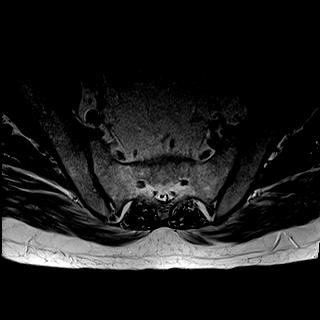
[im 5/48]
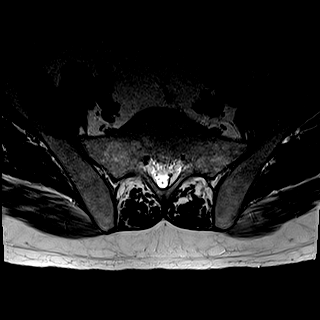
[im 10/48]
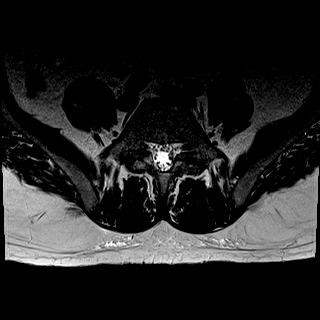
[im 15/48]
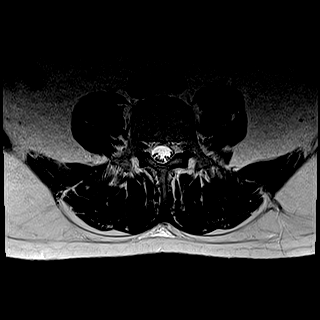
[im 19/48]
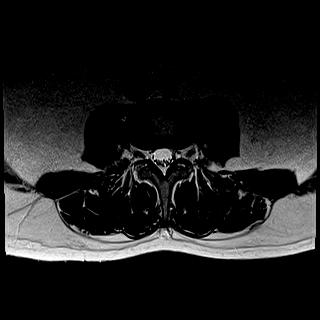
[im 24/48]
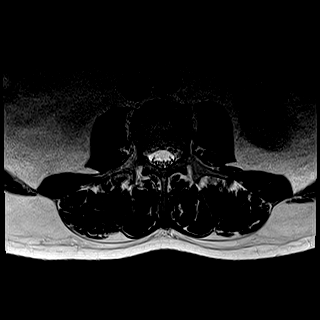
[im 29/48]
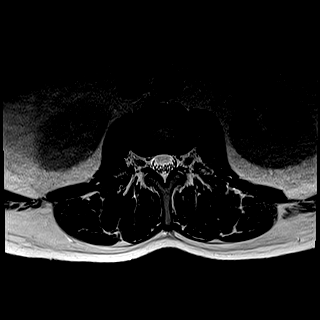
[im 33/48]
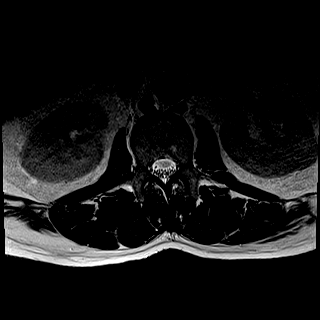
[im 38/48]
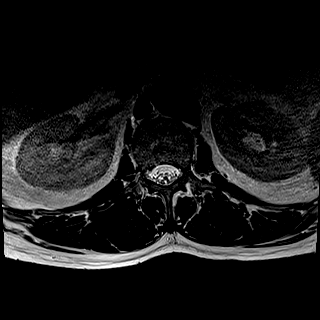
[im 43/48]
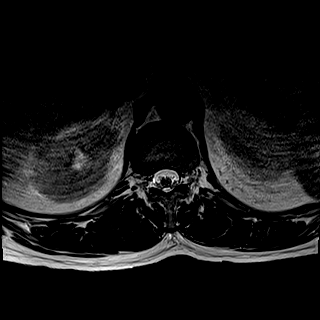
[im 48/48]
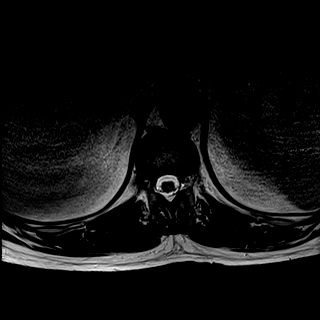

[Series 32: T1 · axial · 4.0mm · 0.34mm/px · z∈[-681,-459]mm · 7 of 48 slices shown (2 of 2)]
[im 1/48]
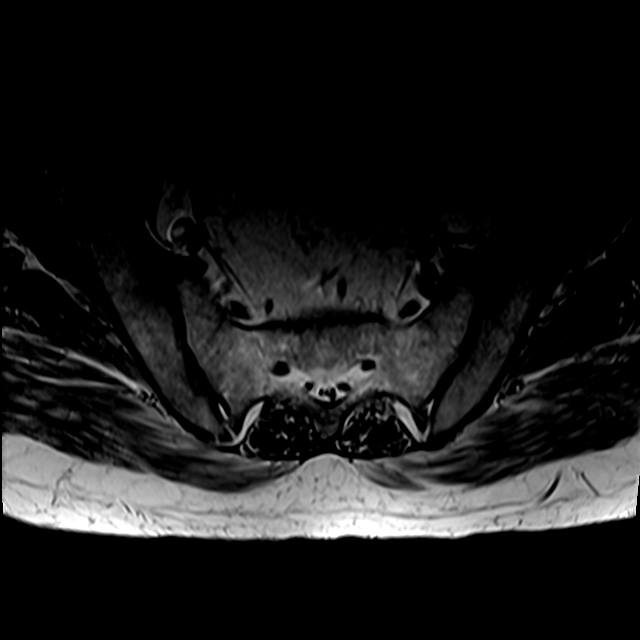
[im 5/48]
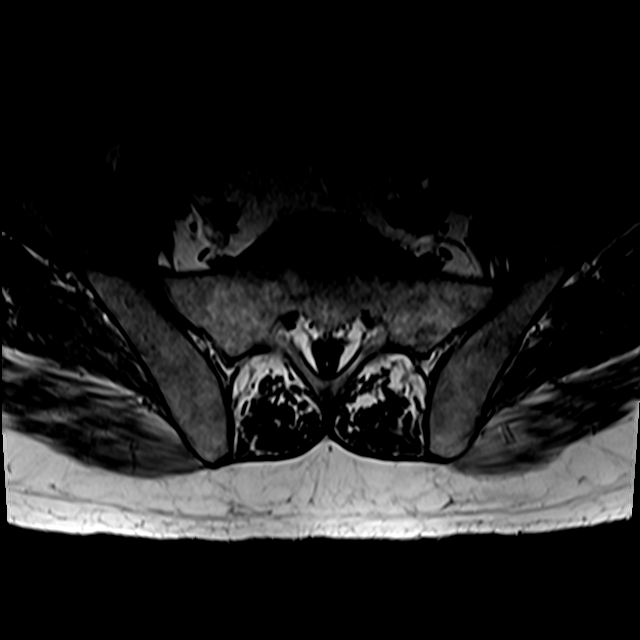
[im 10/48]
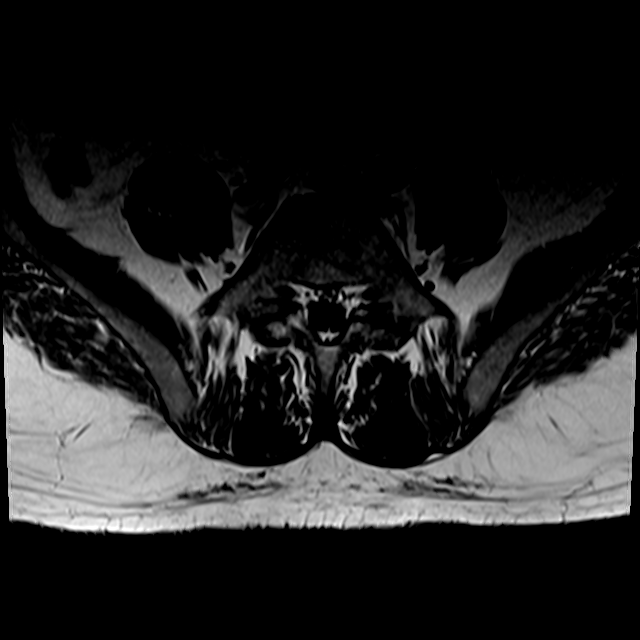
[im 15/48]
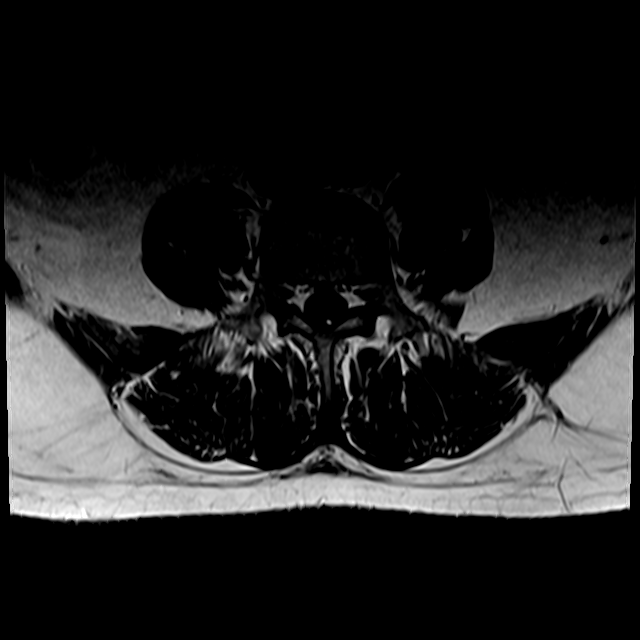
[im 19/48]
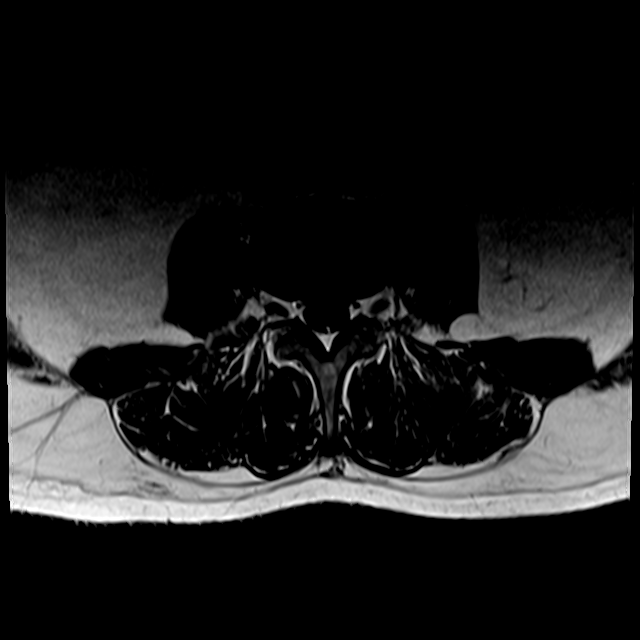
[im 24/48]
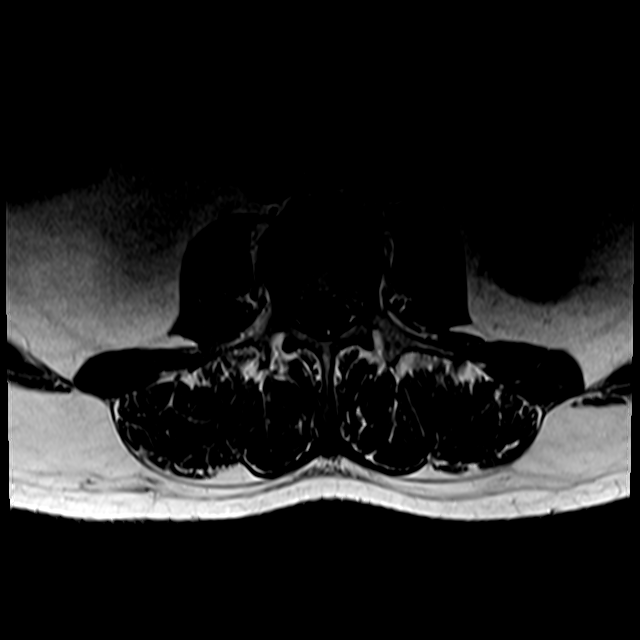
[im 43/48]
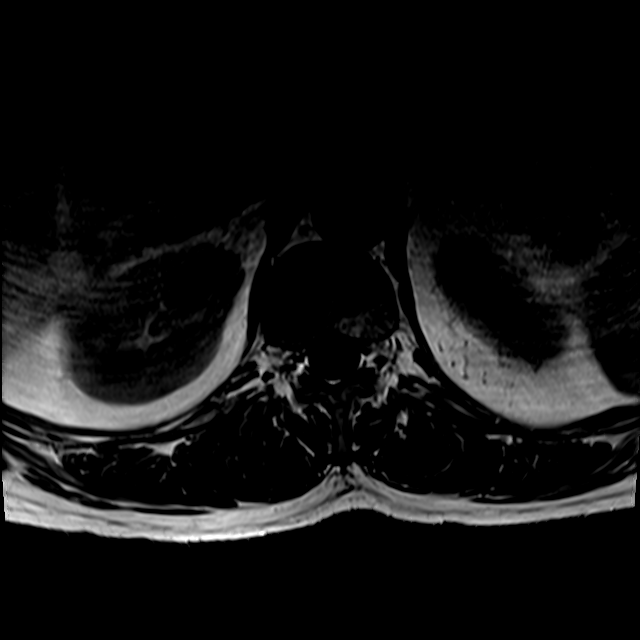

[25 of 48 positions shown; findings below may reference images not displayed]

FINDINGS: MRI CERVICAL SPINE FINDINGS

Alignment: Normal

Vertebrae: No abnormal enhancement.

Cord: No abnormal intrathecal enhancement

Posterior Fossa, vertebral arteries, paraspinal tissues: Negative
for perispinal mass or inflammation.

Disc levels:

No neural impingement.

MRI THORACIC SPINE FINDINGS

Alignment:  Negative for listhesis.

Vertebrae: No abnormal vertebral enhancement.

Cord: No abnormal intrathecal enhancement. Specifically, no mass is
seen at the level of dorsal cord mass effect at T4-5. No herniation
is seen at this level either.

Paraspinal and other soft tissues: Negative for perispinal mass or
inflammation. Hiatal hernia.

Disc levels:

No impingement

MRI LUMBAR SPINE FINDINGS

Segmentation:  5 lumbar type vertebrae

Alignment:  Borderline anterolisthesis at L5-S1

Vertebrae: Chronic bilateral L5 pars defects without marrow edema.
Negative for aggressive bone lesion or abnormal enhancement

Conus medullaris: Extends to the L1 level and appears normal.

Paraspinal and other soft tissues: Negative for perispinal mass or
inflammation

Disc levels:

Diffusely preserved disc height and hydration. Negative facets. No
neural impingement.
IMPRESSION: 1. Known dorsal cord mass effect at T4-5. No underlying intrathecal
mass, cord herniation, or loss of CSF flow voids- appearance usually
from arachnoid web.
2. Normal cord and cauda equina signal.
3. Chronic L5 pars defects.

## 2023-04-21 ENCOUNTER — Other Ambulatory Visit: Payer: Self-pay

## 2023-04-21 ENCOUNTER — Encounter (HOSPITAL_COMMUNITY): Payer: Self-pay | Admitting: Emergency Medicine

## 2023-04-21 ENCOUNTER — Emergency Department (HOSPITAL_COMMUNITY): Payer: Medicaid Other

## 2023-04-21 ENCOUNTER — Emergency Department (HOSPITAL_COMMUNITY)
Admission: EM | Admit: 2023-04-21 | Discharge: 2023-04-21 | Disposition: A | Discharge status: Home / Self Care | Payer: Medicaid Other | Attending: Emergency Medicine | Admitting: Emergency Medicine

## 2023-04-21 DIAGNOSIS — S93402A Sprain of unspecified ligament of left ankle, initial encounter: Secondary | ICD-10-CM

## 2023-04-21 DIAGNOSIS — X509XXA Other and unspecified overexertion or strenuous movements or postures, initial encounter: Secondary | ICD-10-CM | POA: Insufficient documentation

## 2023-04-21 DIAGNOSIS — M25572 Pain in left ankle and joints of left foot: Secondary | ICD-10-CM | POA: Diagnosis present

## 2023-04-21 NOTE — ED Notes (Signed)
Ortho tech notified.  

## 2023-04-21 NOTE — ED Triage Notes (Signed)
Pt states he was stung by a bug yesterday and as he turned to run he twisted his left ankle.  The ankle is now swollen and very painful to walk on.

## 2023-04-21 NOTE — Progress Notes (Signed)
Orthopedic Tech Progress Note Patient Details:  Shane Holmes [birth year] [id number]  Ortho Devices Type of Ortho Device: CAM walker, Crutches Ortho Device/Splint Location: lle Ortho Device/Splint Interventions: Ordered, Application, Adjustment   Post Interventions Patient Tolerated: Well Instructions Provided: Care of device, Adjustment of device  Trinna Post 04/21/2023, 6:23 AM

## 2023-04-21 NOTE — ED Provider Notes (Signed)
[facility]PT [facility] Emergency Department Provider Note MRN:  [id number]  Arrival date & time: 04/21/23     Chief Complaint   Ankle Pain   History of Present Illness   Shane Holmes is a 37 y.o. year-old male presents to the ED with chief complaint of left ankle pain.  He states that yesterday he was stung by a bug and in his attempt to flee from the bug rolled his ankle.  He has hx of peripheral neuropathy.  He sought evaluation after noticing swelling and persistent pain on the ankle with attempted ambulation.  History provided by patient.   Review of Systems  Pertinent positive and negative review of systems noted in HPI.    Physical Exam   Vitals:   04/21/23 0016 04/21/23 0226  BP: (!) 127/93 106/76  Pulse: (!) 102 94  Resp: 18 18  Temp: 98.5 F (36.9 C) 97.6 F (36.4 C)  SpO2: 97% 97%    CONSTITUTIONAL:  non toxic-appearing, NAD NEURO:  Alert and oriented x 3, CN 3-12 grossly intact EYES:  eyes equal and reactive ENT/NECK:  Supple, no stridor  CARDIO:  appears well-perfused  PULM:  No respiratory distress,  GI/GU:  non-distended,  MSK/SPINE:  No gross deformities, mild edema, decreased ROM of left ankle SKIN:  no rash, a few minor scratches on the left foot and ankle   *Additional and/or pertinent findings included in MDM below  Diagnostic and Interventional Summary    EKG Interpretation  Date/Time:    Ventricular Rate:    PR Interval:    QRS Duration:   QT Interval:    QTC Calculation:   R Axis:     Text Interpretation:         Labs Reviewed - No data to display  DG Ankle Complete Left  Final Result      Medications - No data to display   Procedures  /  Critical Care Procedures  ED Course and Medical Decision Making  I have reviewed the triage vital signs, the nursing notes, and pertinent available records from the EMR.  Social Determinants Affecting Complexity of Care: Patient has no clinically significant social  determinants affecting this chief complaint..   ED Course:    Medical Decision Making Patient presents with injury to left ankle.  DDx includes, fracture, strain, or sprain.  Consultants: none  Plain films reveal no fracture or dislocation.  Pt advised to follow up with PCP and/or orthopedics. Patient given cam boot and crutches while in ED, conservative therapy such as RICE recommended and discussed.   Patient will be discharged home & is agreeable with above plan. Returns precautions discussed. Pt appears safe for discharge.   Amount and/or Complexity of Data Reviewed Radiology: ordered and independent interpretation performed.    Details: No notable fracture or dislocation     Consultants: No consultations were needed in caring for this patient.   Treatment and Plan: Emergency department workup does not suggest an emergent condition requiring admission or immediate intervention beyond  what has been performed at this time. The patient is safe for discharge and has  been instructed to return immediately for worsening symptoms, change in  symptoms or any other concerns    Final Clinical Impressions(s) / ED Diagnoses     ICD-10-CM   1. Sprain of left ankle, unspecified ligament, initial encounter  N82.956O       ED Discharge Orders     None  Discharge Instructions Discussed with and Provided to Patient:   Discharge Instructions   None      Roxy Horseman, PA-C 04/21/23 0526    Gilda Crease, MD 04/21/23 [phone]

## 2023-04-28 ENCOUNTER — Encounter: Payer: Self-pay | Admitting: Neurology

## 2023-04-29 ENCOUNTER — Other Ambulatory Visit: Payer: Self-pay | Admitting: Neurology

## 2023-04-29 MED ORDER — GABAPENTIN 800 MG PO TABS: 800.0000 mg | ORAL_TABLET | Freq: Three times a day (TID) | ORAL | 0 refills | Status: DC

## 2023-06-02 ENCOUNTER — Other Ambulatory Visit: Payer: Self-pay | Admitting: Neurology

## 2023-06-03 ENCOUNTER — Encounter: Payer: Self-pay | Admitting: Neurology

## 2023-06-03 MED ORDER — GABAPENTIN 800 MG PO TABS: 800.0000 mg | ORAL_TABLET | Freq: Three times a day (TID) | ORAL | 0 refills | Status: DC

## 2023-06-25 ENCOUNTER — Other Ambulatory Visit: Payer: Self-pay | Admitting: Neurology

## 2023-06-25 DIAGNOSIS — K920 Hematemesis: Secondary | ICD-10-CM

## 2023-07-02 ENCOUNTER — Other Ambulatory Visit: Payer: Self-pay | Admitting: Neurology

## 2023-07-31 ENCOUNTER — Other Ambulatory Visit: Payer: Self-pay | Admitting: Neurology

## 2023-09-01 ENCOUNTER — Other Ambulatory Visit: Payer: Self-pay | Admitting: Neurology

## 2023-09-02 ENCOUNTER — Encounter: Payer: Self-pay | Admitting: Neurology

## 2023-09-02 ENCOUNTER — Other Ambulatory Visit: Payer: Self-pay | Admitting: Neurology

## 2023-09-02 MED ORDER — GABAPENTIN 800 MG PO TABS: 800.0000 mg | ORAL_TABLET | Freq: Three times a day (TID) | ORAL | 0 refills | Status: DC

## 2023-09-03 NOTE — Progress Notes (Signed)
I saw Shane Holmes in neurology clinic on 09/11/23 in follow up for neuropathy.  HPI: Shane Holmes is a 37 y.o. year old male with a history of DM, asthma, EtOH abuse, back pain who we last saw on 03/06/23.  To briefly review: 08/27/22: Patient's symptoms started about 1.5 years ago (~beginning of 2022). He had gradually had muscle aches that worsened. He had to be hospitalized about 1.5 years ago for this. He was doing better, but then things progressed. He currently has numbness and electric sensations at least from his thighs to the tips of feet. Patient has a lot of cramps in his legs, particularly in the hamstrings. The spasms occur only at night, when he lays down. They were very consistent until more recently as they have calmed and become more random now. He has great difficulty walking, especially in the mornings. He feels pressure when walking. He denies any significant change in back pain.   Patient was seen in the ED 06/07/22 for muscle spasms. No acute abnormalities were found, so patient was referred to outpatient neurology.   Patient was previously evaluated by inpatient neurology, first in 01/24/21 when he bilateral leg weakness in the setting of COVID + and GI bleed. There was some concern for GBS so MRI and LP were performed. LP was normal. MRI showed chronic L5 pars fractures with degeneration but no spinal stenosis or neural impingement. Patient was diagnosed with alcoholic polyneuropathy and asked to follow up with neurology as outpatient, which he never did.    He was seen again by inpatient neurology on 01/09/22 for acute onset BLE numbness and weakness and RUE weakness and numbness. Exam was suggestive of functional weakness at that time. MRI cervical and thoracic spine was recommended, but patient would not lay still for MRI. He left the ED prior to being discharged.   The patient has not had similar episodes of symptoms in the past.    Any change in urine color, especially  after exertion/physical activity? No   The patient has not noticed any recent skin rashes nor does he report any constitutional symptoms like fever, night sweats, anorexia or unintentional weight loss.   EtOH use: Currently drinks 6 beers per day; 12 beers per day (per patient report in 2022) Restrictive diet? No Family history of neuropathy/myopathy/NM disease? No  03/06/23: Labs were significant for low B1. I recommended supplementation with 100 mg daily of thiamine. He is taking this as well as vit D supplement.   EMG on 09/30/22 showed severe axonal large fiber sensorimotor polyneuropathy with active denervation changes seen in distal muscles.   Since last visit, patient has been to the ED many times for different problems, but multiple times with pain in legs.    Gabapentin has helped with leg jerking. Hydrocodone was prescribed by pain management (seen by [facility] medical). Patient tried Cymbalta but had very bad reaction (he could not move and had to go to the hospital). He was also prescribed Nortriptyline 25 mg by ED but this did not help.   He is establishing care with PCP on Monday (also [facility] medical) for DM.   Currently he is drinking 2-3 drinks per day (previously about 6 beers per day). He has cut out liquor completely.   Patient denies falls, but girlfriend mentions he has fallen a couple of times.  Most recent Assessment and Plan (03/06/23): This is Shane Holmes, a 37 y.o. male with neuropathy (severe axonal by EMG on 09/30/22)  likely secondary to EtOH abuse and DM. His neuropathic symptoms have improved but are likely still being exacerbated by ongoing EtOH use and DM.   Plan: -Continue gabapentin 600 mg TID -Continue to follow up with pain management -Establish care with PCP as planned -Discussed EtOH cutting back and DM management  Since their last visit: Patient is having some spasms in his feet, but otherwise similar to prior. He has to be careful ambulating, but  has not had any falls. Gabapentin was increased to 800 mg TID on 04/29/23. This is working well for patient.  He continues to take B1 supplementation. He has established with PCP at [facility] with Dr. Salli Real (who now also does his pain management).  In terms of EtOH use, he is still drinking on weekends and having a couple drinks. He is still working toward sobriety.   MEDICATIONS:  Outpatient Encounter Medications as of 09/11/2023  Medication Sig   albuterol (VENTOLIN HFA) 108 (90 Base) MCG/ACT inhaler INHALE 2 PUFFS INTO THE LUNGS EVERY SIX HOURS AS NEEDED FOR WHEEZING OR SHORTNESS OF BREATH.   cholecalciferol (VITAMIN D3) 25 MCG (1000 UNIT) tablet Take 50,000 Units by mouth daily.   metFORMIN (GLUCOPHAGE) 500 MG tablet Take 1 tablet (500 mg total) by mouth 2 (two) times daily with a meal.   pantoprazole (PROTONIX) 40 MG tablet TAKE 1 TABLET BY MOUTH TWICE A DAY   thiamine (VITAMIN B1) 100 MG tablet Take 1 tablet (100 mg total) by mouth daily.   [DISCONTINUED] gabapentin (NEURONTIN) 800 MG tablet Take 1 tablet (800 mg total) by mouth 3 (three) times daily.   clotrimazole (LOTRIMIN) 1 % cream Apply to affected area 2 times daily (Patient not taking: Reported on 03/06/2023)   gabapentin (NEURONTIN) 800 MG tablet Take 1 tablet (800 mg total) by mouth 3 (three) times daily.   [DISCONTINUED] dicyclomine (BENTYL) 20 MG tablet Take 1 tablet (20 mg total) by mouth 2 (two) times daily. (Patient not taking: Reported on 11/02/2019)   [DISCONTINUED] nortriptyline (PAMELOR) 25 MG capsule Take 1 capsule (25 mg total) by mouth at bedtime for 21 days. (Patient not taking: Reported on 09/11/2023)   No facility-administered encounter medications on file as of 09/11/2023.    PAST MEDICAL HISTORY: Past Medical History:  Diagnosis Date   Alcohol abuse    Asthma    Back pain    Bulging lumbar disc    COVID    01/2021   Encounter for lumbar puncture    Hematochezia    Hypokalemia    Hyponatremia     Lower GI bleed    Neuropathy     PAST SURGICAL HISTORY: Past Surgical History:  Procedure Laterality Date   BIOPSY  06/19/2022   Procedure: BIOPSY;  Surgeon: Sherrilyn Rist, MD;  Location: [facility] END[facility]COPY;  Service: Gastroenterology;;   ESOPHAGOGASTRODUODENOSCOPY (EGD) WITH PROPOFOL N/A 06/19/2022   Procedure: ESOPHAGOGASTRODUODENOSCOPY (EGD) WITH PROPOFOL;  Surgeon: Sherrilyn Rist, MD;  Location: [facility] ENDOSCOPY;  Service: Gastroenterology;  Laterality: N/A;    ALLERGIES: Allergies  Allergen Reactions   Lidocaine-Glycerin Other (See Comments)    Redness to application site    FAMILY HISTORY: Family History  Problem Relation Age of Onset   Cirrhosis Mother    Cirrhosis Father    Cancer Maternal Grandfather    Cancer Paternal Grandfather    Diabetes Maternal Aunt    Hypertension Neg Hx     SOCIAL HISTORY: Social History   Tobacco Use   Smoking status:  Some Days    Types: Cigarettes   Smokeless tobacco: Never   Tobacco comments:    Close to a pack at day--1/2 pack at day 09/11/23  Vaping Use   Vaping status: Never Used  Substance Use Topics   Alcohol use: Yes    Alcohol/week: 6.0 standard drinks of alcohol    Types: 6 Cans of beer per week    Comment: occas   Drug use: Yes    Comment: cocaine positive 01/2022/ CbD   Social History   Social History Narrative   Right handed   Caffeine 1 -3 can a day   Live in one level home      Are you currently employed ?    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   Who lives with you? Girlfriend   What type of home do you live in: 1 story or 2 story? one        Objective:  Vital Signs:  BP 126/84   Pulse (!) 111   Ht 5\' 11"  (1.803 m)   Wt 207 lb (93.9 kg)   SpO2 99%   BMI 28.87 kg/m   General: General appearance: Awake and alert. No distress. Cooperative with exam.  Skin: No obvious rash or jaundice. HEENT: Atraumatic. Anicteric. Lungs: Non-labored breathing on room air   Extremities: No edema. No obvious deformity.   Neurological: Mental Status: Alert. Speech fluent. No pseudobulbar affect Cranial Nerves: CNII: No RAPD. Visual fields intact. CNIII, IV, VI: PERRL. No nystagmus. EOMI. CN V: Facial sensation intact bilaterally to fine touch. CN VII: Facial muscles symmetric and strong. No ptosis at rest. CN VIII: Hears finger rub well bilaterally. CN IX: No hypophonia. CN X: Palate elevates symmetrically. CN XI: Full strength shoulder shrug bilaterally. CN XII: Tongue protrusion full and midline. No atrophy or fasciculations. No significant dysarthria Motor: Tone is normal.  Individual muscle group testing (MRC grade out of 5):  Movement     Neck flexion 5    Neck extension 5     Right Left   Shoulder abduction 5 5   Elbow flexion 5 5   Elbow extension 5 5   Finger abduction - FDI 5 5   Finger abduction - ADM 5 5   Finger extension 5 5   Finger distal flexion - 2/3 5 5    Finger distal flexion - 4/5 5 5    Thumb flexion - FPL 5 5   Thumb abduction - APB 5 5    Hip flexion 5- 5-   Hip extension 5 5   Knee extension 5 5   Knee flexion 5- 5-   Dorsiflexion 5- 5-   Plantarflexion 5 5    Reflexes:  Right Left  Bicep 2+ 2+  Tricep 2+ 2+  BrRad 2+ 2+  Knee 2+ 2+  Ankle 0 0   Sensation: Pinprick: Diminished to bilateral calves in lower extremities, otherwise intact. Vibration: Absent in great toes, diminished in ankles, present in patella bilaterally Coordination: Intact finger-to- nose-finger bilaterally. Gait: Antalgic, wide based gait.   Lab and Test Review: No new results  Previously reviewed results: 08/27/22: Vit D wnl PTH wnl IFE no M protein Iron studies: ferritin low at 37 Ionized Ca: mildly low 4.6 (normal is 4.7) B1: < 6   EtOH (10/21/22): 23 EtOH (12/18/22): 46 CMP (02/13/23): Component     Latest Ref Rng 02/13/2023  Sodium     135 - 145 mmol/L 132 (  L)   Potassium     3.5 - 5.1 mmol/L 4.2   Chloride     98  - 111 mmol/L 103   CO2     22 - 32 mmol/L 15 (L)   Glucose     70 - 99 mg/dL 161 (H)   BUN     6 - 20 mg/dL 5 (L)   Creatinine     0.61 - 1.24 mg/dL 0.96 (L)   Calcium     8.9 - 10.3 mg/dL 8.8 (L)   Total Protein     6.5 - 8.1 g/dL 8.7 (H)   Albumin     3.5 - 5.0 g/dL 4.6   AST     15 - 41 U/L 21   ALT     0 - 44 U/L 14   Alkaline Phosphatase     38 - 126 U/L 64   Total Bilirubin     0.3 - 1.2 mg/dL 0.8   GFR, Estimated     >60 mL/min >60   Anion gap     5 - 15  14     Normal or unremarkable: CK (59)  06/20/22: CBC significant for anemia (Hb of 9.5), platelets low at 112 BMP significant for glucose to 132, calcium to 8.1 B12: 449 HbA1c: 7.4   EtOH (in ED on 06/07/22): 202, (01/09/22): 274, (11/02/19): 211   MRI lumbar spine w/wo contrast (01/25/21): FINDINGS: Segmentation:  Normal on the 2020 CT.   Alignment: Normal lumbar lordosis. Stable vertebral height and alignment since 2010.   Vertebrae: No marrow edema or evidence of acute osseous abnormality. Visualized bone marrow signal is within normal limits. Intact visible sacrum and SI joints.   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 No associated spondylolisthesis at that level. See additional facet details below.   Conus medullaris and cauda equina: Conus extends to the T12-L1 level. No lower spinal cord or conus signal abnormality. Small fatty filum terminalis (series 9, image 27, normal variant). Cauda equina nerve roots appear normal. No abnormal intradural enhancement or dural thickening.   Paraspinal and other soft tissues: Small area of patchy abnormal STIR signal and subtle soft tissue enhancement in the deep left paraspinal erector spinae muscles adjacent to the L4-L5 posterior elements (series 6, image 11 and series 10, image 31). No fluid collection, and the other bilateral paraspinal muscles remain normal. This area was normal on the 2010 MRI. No underlying acute osseous  abnormality.   Negative visible abdominal viscera.   Disc levels:   T11-T12: Partially visible and negative.   T12-L1:  Negative.   L1-L2:  Negative.   L2-L3:  Negative.   L3-L4:  Negative.   L4-L5: Negative disc. Mild to moderate facet and ligament flavum hypertrophy. Trace degenerative facet joint fluid on the left, which is in proximity to the small area of abnormal paraspinal soft tissue described earlier. But there is no marrow edema in the facets. No stenosis.   L5-S1: Negative disc. Mild facet hypertrophy with trace degenerative facet joint fluid. No stenosis.   IMPRESSION: 1. Small 2 cm area of soft tissue inflammation/myositis in the left erector spinous muscle at L4-L5. There is regional chronic L4-L5 facet degeneration (see #2) but no associated osseous abnormality and no other acute or inflammatory process in the lumbar spine.   2. 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 But normal lumbar discs with no spinal stenosis or neural impingement.   MRI cervical  and thoracic spine wo contrast (01/09/22): FINDINGS: MRI CERVICAL SPINE FINDINGS   Alignment: Examination degraded by motion artifact. Physiologic with preservation of the normal cervical lordosis. No listhesis.   Vertebrae: Vertebral body height maintained without acute or chronic fracture. Bone marrow signal intensity within normal limits. No discrete or worrisome osseous lesions or abnormal marrow edema.   Cord: Grossly normal signal and morphology. No convincing cord signal changes on this motion degraded exam.   Posterior Fossa, vertebral arteries, paraspinal tissues: Unremarkable.   Disc levels:   No significant disc pathology seen within the cervical spine. No disc bulge or focal disc herniation. No significant facet disease. No canal or neural foraminal stenosis or evidence for neural impingement.   MRI THORACIC SPINE FINDINGS   Alignment:  Examination degraded  by motion artifact.   Vertebral bodies normally aligned with preservation of the normal thoracic kyphosis. No listhesis.   Vertebrae: Minimal chronic height loss noted at the superior endplate of T3. Otherwise, vertebral body height maintained. Underlying bone marrow signal intensity within normal limits. No discrete or worrisome osseous lesions. No abnormal marrow edema.   Cord: There is slight anterior/ventral displacement of the upper thoracic spinal cord at the level of T4-5 (series 9, image 11). Posterior aspect of the cord is flattened (series 12, image 14). Finding raises the possibility for a subtle extramedullary, intradural lesion at this location, possibly an arachnoid cyst, although this is not definitely seen on this motion degraded exam. No definite dural defect, although a possible focal spinal cord herniation could also be considered. No visible associated cord signal changes on this motion degraded exam. Thoracic spinal cord is otherwise normal in appearance.   Paraspinal and other soft tissues: Unremarkable.   Disc levels:   Small chronic degenerative endplate Schmorl's node noted at the inferior endplate of T9. Otherwise, no other significant disc pathology seen within the thoracic spine. No disc bulge or focal disc herniation. No spinal stenosis. Foramina remain patent.   IMPRESSION: 1. Motion degraded exam. 2. Slight anterior/ventral displacement of the upper thoracic spinal cord at the level of T4-5. Finding raises the possibility for a subtle extramedullary, intradural lesion at this location, possibly an arachnoid cyst, although this is not definitely seen on this motion degraded exam. Possible focal spinal cord herniation could also be considered. No visible associated cord signal changes. Further evaluation with dedicated CT myelography may be helpful for further evaluation as warranted. Postcontrast imaging could also be considered. 3. Otherwise  unremarkable and normal MRI of the cervical and thoracic spine. No significant disc pathology, stenosis, or evidence for neural impingement.   MRI cervical, thoracic, and lumbar spine (01/10/22): FINDINGS: MRI CERVICAL SPINE FINDINGS   Alignment: Normal   Vertebrae: No abnormal enhancement.   Cord: No abnormal intrathecal enhancement   Posterior Fossa, vertebral arteries, paraspinal tissues: Negative for perispinal mass or inflammation.   Disc levels:   No neural impingement.   MRI THORACIC SPINE FINDINGS   Alignment:  Negative for listhesis.   Vertebrae: No abnormal vertebral enhancement.   Cord: No abnormal intrathecal enhancement. Specifically, no mass is seen at the level of dorsal cord mass effect at T4-5. No herniation is seen at this level either.   Paraspinal and other soft tissues: Negative for perispinal mass or inflammation. Hiatal hernia.   Disc levels:   No impingement   MRI LUMBAR SPINE FINDINGS   Segmentation:  5 lumbar type vertebrae   Alignment:  Borderline anterolisthesis at L5-S1   Vertebrae: Chronic bilateral L5  pars defects without marrow edema. Negative for aggressive bone lesion or abnormal enhancement   Conus medullaris: Extends to the L1 level and appears normal.   Paraspinal and other soft tissues: Negative for perispinal mass or inflammation   Disc levels:   Diffusely preserved disc height and hydration. Negative facets. No neural impingement.   IMPRESSION: 1. 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 No underlying intrathecal mass, cord herniation, or loss of CSF flow voids- appearance usually from arachnoid web. 2. Normal cord and cauda equina signal. 3. Chronic L5 pars defects.   MRI brain w/wo contrast (01/10/22): FINDINGS: Brain: No acute infarction, hemorrhage, hydrocephalus, extra-axial collection or mass lesion. No white matter disease, atrophy, or abnormal enhancement   Vascular: Normal flow voids and vascular  enhancement   Skull and upper cervical spine: Is normal marrow signal   Sinuses/Orbits: Unremarkable   Other: Intermittent motion artifact.   IMPRESSION: Normal MRI of the brain.  ASSESSMENT: This is COREYON NICOTRA, a 38 y.o. male with neuropathy (severe axonal by EMG on 09/30/22) likely secondary to EtOH abuse, thiamine deficiency, and DM. His neuropathic symptoms continue to improve but are likely still being exacerbated by ongoing EtOH use (though patient continues to cut back) and DM.   Plan: -Continue Gabapentin 800 mg TID -Continue B1 100 mg daily -Continue home exercises given by PT -Discussed continuing to cut back on EtOH -Fall precautions discussed  Return to clinic in 1 year  Jacquelyne Balint, MD

## 2023-09-11 ENCOUNTER — Ambulatory Visit: Payer: Medicaid Other | Admitting: Neurology

## 2023-09-11 ENCOUNTER — Encounter: Payer: Self-pay | Admitting: Neurology

## 2023-09-11 VITALS — BP 126/84 | HR 111 | Ht 71.0 in | Wt 207.0 lb

## 2023-09-11 DIAGNOSIS — M79604 Pain in right leg: Secondary | ICD-10-CM | POA: Diagnosis not present

## 2023-09-11 DIAGNOSIS — E519 Thiamine deficiency, unspecified: Secondary | ICD-10-CM

## 2023-09-11 DIAGNOSIS — G629 Polyneuropathy, unspecified: Secondary | ICD-10-CM

## 2023-09-11 DIAGNOSIS — G621 Alcoholic polyneuropathy: Secondary | ICD-10-CM

## 2023-09-11 DIAGNOSIS — E1142 Type 2 diabetes mellitus with diabetic polyneuropathy: Secondary | ICD-10-CM | POA: Diagnosis not present

## 2023-09-11 DIAGNOSIS — F10939 Alcohol use, unspecified with withdrawal, unspecified: Secondary | ICD-10-CM

## 2023-09-11 DIAGNOSIS — R209 Unspecified disturbances of skin sensation: Secondary | ICD-10-CM | POA: Diagnosis not present

## 2023-09-11 DIAGNOSIS — R252 Cramp and spasm: Secondary | ICD-10-CM

## 2023-09-11 DIAGNOSIS — M79605 Pain in left leg: Secondary | ICD-10-CM

## 2023-09-11 DIAGNOSIS — R29898 Other symptoms and signs involving the musculoskeletal system: Secondary | ICD-10-CM

## 2023-09-11 MED ORDER — GABAPENTIN 800 MG PO TABS: 800.0000 mg | ORAL_TABLET | Freq: Three times a day (TID) | ORAL | 11 refills | Status: DC

## 2023-09-11 NOTE — Patient Instructions (Addendum)
-Continue Gabapentin 800 mg TID -Continue B1 100 mg daily -Continue home exercises given by PT -Discussed continuing to cut back on alcohol -Follow up with primary care/pain management as planned.  I will see you back in clinic in 1 year or sooner if needed. Please let me know if you have any questions or concerns in the meantime.   - Recommend the following measures that may provide some symptomatic benefit for muscle twitching and/or cramps: - Adequate oral clear fluid intake to maintain optimal hydration (about 2.5 liters, or around 8-10 glasses per day) Avoidance of caffeine Trial of DIET tonic water: About 1 glass, up to 6 times daily Magnesium oxide up to 400 mg by mouth twice daily, as needed (over the counter) Gentle muscle stretching routine, especially before bedtime   The physicians and staff at [facility] are committed to providing excellent care. You may receive a survey requesting feedback about your experience at our office. We strive to receive "very good" responses to the survey questions. If you feel that your experience would prevent you from giving the office a "very good " response, please contact our office to try to remedy the situation. We may be reached at [phone]. Thank you for taking the time out of your busy day to complete the survey.  Jacquelyne Balint, MD [facility]  Preventing Falls at [facility] are common, often dreaded events in the lives of older people. Aside from the obvious injuries and even death that may result, fall can cause wide-ranging consequences including loss of independence, mental decline, decreased activity and mobility. Younger people are also at risk of falling, especially those with chronic illnesses and fatigue.  Ways to reduce risk for falling Examine diet and medications. Warm foods and alcohol dilate blood vessels, which can lead to dizziness when standing. Sleep aids, antidepressants and pain medications can also  increase the likelihood of a fall.  Get a vision exam. Poor vision, cataracts and glaucoma increase the chances of falling.  Check foot gear. Shoes should fit snugly and have a sturdy, nonskid sole and a broad, low heel  Participate in a physician-approved exercise program to build and maintain muscle strength and improve balance and coordination. Programs that use ankle weights or stretch bands are excellent for muscle-strengthening. Water aerobics programs and low-impact Tai Chi programs have also been shown to improve balance and coordination.  Increase vitamin D intake. Vitamin D improves muscle strength and increases the amount of calcium the body is able to absorb and deposit in bones.  How to prevent falls from common hazards Floors - Remove all loose wires, cords, and throw rugs. Minimize clutter. Make sure rugs are anchored and smooth. Keep furniture in its usual place.  Chairs -- Use chairs with straight backs, armrests and firm seats. Add firm cushions to existing pieces to add height.  Bathroom - Install grab bars and non-skid tape in the tub or shower. Use a bathtub transfer bench or a shower chair with a back support Use an elevated toilet seat and/or safety rails to assist standing from a low surface. Do not use towel racks or bathroom tissue holders to help you stand.  Lighting - Make sure halls, stairways, and entrances are well-lit. Install a night light in your bathroom or hallway. Make sure there is a light switch at the top and bottom of the staircase. Turn lights on if you get up in the middle of the night. Make sure lamps or light switches are within  reach of the bed if you have to get up during the night.  Kitchen - Install non-skid rubber mats near the sink and stove. Clean spills immediately. Store frequently used utensils, pots, pans between waist and eye level. This helps prevent reaching and bending. Sit when getting things out of lower cupboards.  Living room/  Bedrooms - Place furniture with wide spaces in between, giving enough room to move around. Establish a route through the living room that gives you something to hold onto as you walk.  Stairs - Make sure treads, rails, and rugs are secure. Install a rail on both sides of the stairs. If stairs are a threat, it might be helpful to arrange most of your activities on the lower level to reduce the number of times you must climb the stairs.  Entrances and doorways - Install metal handles on the walls adjacent to the doorknobs of all doors to make it more secure as you travel through the doorway.  Tips for maintaining balance Keep at least one hand free at all times. Try using a backpack or fanny pack to hold things rather than carrying them in your hands. Never carry objects in both hands when walking as this interferes with keeping your balance.  Attempt to swing both arms from front to back while walking. This might require a conscious effort if Parkinson's disease has diminished your movement. It will, however, help you to maintain balance and posture, and reduce fatigue.  Consciously lift your feet off of the ground when walking. Shuffling and dragging of the feet is a common culprit in losing your balance.  When trying to navigate turns, use a "U" technique of facing forward and making a wide turn, rather than pivoting sharply.  Try to stand with your feet shoulder-length apart. When your feet are close together for any length of time, you increase your risk of losing your balance and falling.  Do one thing at a time. Don't try to walk and accomplish another task, such as reading or looking around. The decrease in your automatic reflexes complicates motor function, so the less distraction, the better.  Do not wear rubber or gripping soled shoes, they might "catch" on the floor and cause tripping.  Move slowly when changing positions. Use deliberate, concentrated movements and, if needed, use a  grab bar or walking aid. Count 15 seconds between each movement. For example, when rising from a seated position, wait 15 seconds after standing to begin walking.  If balance is a continuous problem, you might want to consider a walking aid such as a cane, walking stick, or walker. Once you've mastered walking with help, you might be ready to try it on your own again.

## 2023-10-23 ENCOUNTER — Other Ambulatory Visit: Payer: Self-pay | Admitting: Neurology

## 2023-10-23 DIAGNOSIS — K920 Hematemesis: Secondary | ICD-10-CM

## 2024-01-27 ENCOUNTER — Inpatient Hospital Stay (HOSPITAL_COMMUNITY): Payer: Medicaid Other | Admitting: Certified Registered Nurse Anesthetist

## 2024-01-27 ENCOUNTER — Inpatient Hospital Stay (HOSPITAL_COMMUNITY)
Admission: EM | Admit: 2024-01-27 | Discharge: 2024-01-29 | DRG: 432 | Disposition: A | Discharge status: Home / Self Care | Payer: Medicaid Other | Attending: Internal Medicine | Admitting: Internal Medicine

## 2024-01-27 ENCOUNTER — Inpatient Hospital Stay (HOSPITAL_COMMUNITY): Payer: Medicaid Other

## 2024-01-27 ENCOUNTER — Encounter (HOSPITAL_COMMUNITY)
Admission: EM | Disposition: A | Discharge status: Home / Self Care | Payer: Self-pay | Source: Home / Self Care | Attending: Internal Medicine

## 2024-01-27 ENCOUNTER — Encounter (HOSPITAL_COMMUNITY): Payer: Self-pay | Admitting: Emergency Medicine

## 2024-01-27 ENCOUNTER — Emergency Department (HOSPITAL_COMMUNITY): Payer: Medicaid Other

## 2024-01-27 ENCOUNTER — Other Ambulatory Visit: Payer: Self-pay

## 2024-01-27 DIAGNOSIS — E1140 Type 2 diabetes mellitus with diabetic neuropathy, unspecified: Secondary | ICD-10-CM | POA: Diagnosis present

## 2024-01-27 DIAGNOSIS — K449 Diaphragmatic hernia without obstruction or gangrene: Secondary | ICD-10-CM

## 2024-01-27 DIAGNOSIS — Z7984 Long term (current) use of oral hypoglycemic drugs: Secondary | ICD-10-CM

## 2024-01-27 DIAGNOSIS — G8929 Other chronic pain: Secondary | ICD-10-CM | POA: Diagnosis present

## 2024-01-27 DIAGNOSIS — I8500 Esophageal varices without bleeding: Secondary | ICD-10-CM

## 2024-01-27 DIAGNOSIS — K921 Melena: Secondary | ICD-10-CM | POA: Diagnosis not present

## 2024-01-27 DIAGNOSIS — Z8616 Personal history of COVID-19: Secondary | ICD-10-CM

## 2024-01-27 DIAGNOSIS — K3189 Other diseases of stomach and duodenum: Secondary | ICD-10-CM

## 2024-01-27 DIAGNOSIS — D689 Coagulation defect, unspecified: Secondary | ICD-10-CM | POA: Diagnosis present

## 2024-01-27 DIAGNOSIS — F1010 Alcohol abuse, uncomplicated: Secondary | ICD-10-CM | POA: Diagnosis not present

## 2024-01-27 DIAGNOSIS — R000 Tachycardia, unspecified: Secondary | ICD-10-CM | POA: Diagnosis present

## 2024-01-27 DIAGNOSIS — K766 Portal hypertension: Secondary | ICD-10-CM | POA: Diagnosis present

## 2024-01-27 DIAGNOSIS — D62 Acute posthemorrhagic anemia: Secondary | ICD-10-CM | POA: Diagnosis present

## 2024-01-27 DIAGNOSIS — J45909 Unspecified asthma, uncomplicated: Secondary | ICD-10-CM | POA: Diagnosis present

## 2024-01-27 DIAGNOSIS — K7030 Alcoholic cirrhosis of liver without ascites: Principal | ICD-10-CM | POA: Diagnosis present

## 2024-01-27 DIAGNOSIS — I8510 Secondary esophageal varices without bleeding: Secondary | ICD-10-CM | POA: Diagnosis not present

## 2024-01-27 DIAGNOSIS — I8511 Secondary esophageal varices with bleeding: Secondary | ICD-10-CM | POA: Diagnosis present

## 2024-01-27 DIAGNOSIS — Z79899 Other long term (current) drug therapy: Secondary | ICD-10-CM

## 2024-01-27 DIAGNOSIS — D696 Thrombocytopenia, unspecified: Secondary | ICD-10-CM | POA: Diagnosis present

## 2024-01-27 DIAGNOSIS — Z833 Family history of diabetes mellitus: Secondary | ICD-10-CM

## 2024-01-27 DIAGNOSIS — R7989 Other specified abnormal findings of blood chemistry: Secondary | ICD-10-CM | POA: Diagnosis present

## 2024-01-27 DIAGNOSIS — K7689 Other specified diseases of liver: Secondary | ICD-10-CM | POA: Diagnosis present

## 2024-01-27 DIAGNOSIS — F17210 Nicotine dependence, cigarettes, uncomplicated: Secondary | ICD-10-CM | POA: Diagnosis present

## 2024-01-27 DIAGNOSIS — Z8719 Personal history of other diseases of the digestive system: Secondary | ICD-10-CM | POA: Diagnosis not present

## 2024-01-27 DIAGNOSIS — K219 Gastro-esophageal reflux disease without esophagitis: Secondary | ICD-10-CM | POA: Diagnosis present

## 2024-01-27 DIAGNOSIS — K2289 Other specified disease of esophagus: Secondary | ICD-10-CM

## 2024-01-27 DIAGNOSIS — K920 Hematemesis: Secondary | ICD-10-CM | POA: Diagnosis present

## 2024-01-27 DIAGNOSIS — D731 Hypersplenism: Secondary | ICD-10-CM | POA: Diagnosis present

## 2024-01-27 DIAGNOSIS — Y906 Blood alcohol level of 120-199 mg/100 ml: Secondary | ICD-10-CM | POA: Diagnosis present

## 2024-01-27 DIAGNOSIS — F1020 Alcohol dependence, uncomplicated: Secondary | ICD-10-CM | POA: Diagnosis present

## 2024-01-27 DIAGNOSIS — K769 Liver disease, unspecified: Secondary | ICD-10-CM | POA: Diagnosis not present

## 2024-01-27 DIAGNOSIS — D6959 Other secondary thrombocytopenia: Secondary | ICD-10-CM | POA: Diagnosis present

## 2024-01-27 DIAGNOSIS — K2100 Gastro-esophageal reflux disease with esophagitis, without bleeding: Secondary | ICD-10-CM | POA: Diagnosis not present

## 2024-01-27 DIAGNOSIS — K2270 Barrett's esophagus without dysplasia: Secondary | ICD-10-CM | POA: Diagnosis present

## 2024-01-27 DIAGNOSIS — E876 Hypokalemia: Secondary | ICD-10-CM | POA: Diagnosis present

## 2024-01-27 DIAGNOSIS — Z888 Allergy status to other drugs, medicaments and biological substances status: Secondary | ICD-10-CM | POA: Diagnosis not present

## 2024-01-27 DIAGNOSIS — F1090 Alcohol use, unspecified, uncomplicated: Secondary | ICD-10-CM | POA: Diagnosis not present

## 2024-01-27 DIAGNOSIS — M549 Dorsalgia, unspecified: Secondary | ICD-10-CM | POA: Diagnosis present

## 2024-01-27 DIAGNOSIS — Z720 Tobacco use: Secondary | ICD-10-CM | POA: Diagnosis not present

## 2024-01-27 DIAGNOSIS — F1029 Alcohol dependence with unspecified alcohol-induced disorder: Secondary | ICD-10-CM | POA: Diagnosis not present

## 2024-01-27 HISTORY — DX: Type 2 diabetes mellitus without complications: E11.9

## 2024-01-27 HISTORY — PX: ESOPHAGOGASTRODUODENOSCOPY (EGD) WITH PROPOFOL: SHX5813

## 2024-01-27 HISTORY — PX: BIOPSY: SHX5522

## 2024-01-27 LAB — COMPREHENSIVE METABOLIC PANEL
ALT: 27 U/L (ref 0–44)
AST: 126 U/L — ABNORMAL HIGH (ref 15–41)
Albumin: 3.4 g/dL — ABNORMAL LOW (ref 3.5–5.0)
Alkaline Phosphatase: 105 U/L (ref 38–126)
Anion gap: 24 — ABNORMAL HIGH (ref 5–15)
BUN: <5 mg/dL — ABNORMAL LOW (ref 6–20)
CO2: 22 mmol/L (ref 22–32)
Calcium: 8.7 mg/dL — ABNORMAL LOW (ref 8.9–10.3)
Chloride: 90 mmol/L — ABNORMAL LOW (ref 98–111)
Creatinine, Ser: 0.6 mg/dL — ABNORMAL LOW (ref 0.61–1.24)
GFR, Estimated: >60 mL/min (ref >60)
Glucose, Bld: 132 mg/dL — ABNORMAL HIGH (ref 70–99)
Potassium: 3 mmol/L — ABNORMAL LOW (ref 3.5–5.1)
Sodium: 136 mmol/L (ref 135–145)
Total Bilirubin: 5.7 mg/dL — ABNORMAL HIGH (ref 0.0–1.2)
Total Protein: 7.2 g/dL (ref 6.5–8.1)

## 2024-01-27 LAB — PROTIME-INR
INR: 1.4 — ABNORMAL HIGH (ref 0.8–1.2)
Prothrombin Time: 17.3 s — ABNORMAL HIGH (ref 11.4–15.2)

## 2024-01-27 LAB — TYPE AND SCREEN
ABO/RH(D): O POS
Antibody Screen: NEGATIVE

## 2024-01-27 LAB — LIPASE, BLOOD: Lipase: 40 U/L (ref 11–51)

## 2024-01-27 LAB — CBC
HCT: 31.6 % — ABNORMAL LOW (ref 39.0–52.0)
Hemoglobin: 11.3 g/dL — ABNORMAL LOW (ref 13.0–17.0)
MCH: 31.3 pg (ref 26.0–34.0)
MCHC: 35.8 g/dL (ref 30.0–36.0)
MCV: 87.5 fL (ref 80.0–100.0)
Platelets: 61 10*3/uL — ABNORMAL LOW (ref 150–400)
RBC: 3.61 MIL/uL — ABNORMAL LOW (ref 4.22–5.81)
RDW: 24.5 % — ABNORMAL HIGH (ref 11.5–15.5)
WBC: 6.5 10*3/uL (ref 4.0–10.5)
nRBC: 0 % (ref 0.0–0.2)

## 2024-01-27 LAB — BASIC METABOLIC PANEL
Anion gap: 17 — ABNORMAL HIGH (ref 5–15)
BUN: <5 mg/dL — ABNORMAL LOW (ref 6–20)
CO2: 30 mmol/L (ref 22–32)
Calcium: 8.8 mg/dL — ABNORMAL LOW (ref 8.9–10.3)
Chloride: 90 mmol/L — ABNORMAL LOW (ref 98–111)
Creatinine, Ser: 0.66 mg/dL (ref 0.61–1.24)
GFR, Estimated: >60 mL/min (ref >60)
Glucose, Bld: 134 mg/dL — ABNORMAL HIGH (ref 70–99)
Potassium: 3.4 mmol/L — ABNORMAL LOW (ref 3.5–5.1)
Sodium: 137 mmol/L (ref 135–145)

## 2024-01-27 LAB — HIV ANTIBODY (ROUTINE TESTING W REFLEX): HIV Screen 4th Generation wRfx: NONREACTIVE

## 2024-01-27 LAB — TROPONIN I (HIGH SENSITIVITY)
Troponin I (High Sensitivity): 9 ng/L (ref <18)
Troponin I (High Sensitivity): 10 ng/L (ref <18)

## 2024-01-27 LAB — HEMOGLOBIN AND HEMATOCRIT, BLOOD
HCT: 29.6 % — ABNORMAL LOW (ref 39.0–52.0)
HCT: 28.6 % — ABNORMAL LOW (ref 39.0–52.0)
Hemoglobin: 10.5 g/dL — ABNORMAL LOW (ref 13.0–17.0)
Hemoglobin: 10.1 g/dL — ABNORMAL LOW (ref 13.0–17.0)

## 2024-01-27 LAB — GLUCOSE, CAPILLARY: Glucose-Capillary: 201 mg/dL — ABNORMAL HIGH (ref 70–99)

## 2024-01-27 LAB — ETHANOL: Alcohol, Ethyl (B): 175 mg/dL — ABNORMAL HIGH (ref <10)

## 2024-01-27 SURGERY — ESOPHAGOGASTRODUODENOSCOPY (EGD) WITH PROPOFOL
Anesthesia: Monitor Anesthesia Care

## 2024-01-27 MED ORDER — SODIUM CHLORIDE 0.9 % IV SOLN
2.0000 g | INTRAVENOUS | Status: DC
  Administered 2024-01-28 – 2024-01-29 (×2): 2 g via INTRAVENOUS
  Filled 2024-01-27 (×2): qty 20

## 2024-01-27 MED ORDER — ONDANSETRON HCL 4 MG/2ML IJ SOLN: 4.0000 mg | Freq: Once | INTRAMUSCULAR | Status: DC | PRN

## 2024-01-27 MED ORDER — BACLOFEN 10 MG PO TABS
10.0000 mg | ORAL_TABLET | Freq: Two times a day (BID) | ORAL | Status: DC
  Administered 2024-01-27 – 2024-01-28 (×4): 10 mg via ORAL
  Filled 2024-01-27 (×4): qty 1

## 2024-01-27 MED ORDER — LORAZEPAM 2 MG/ML IJ SOLN: 1.0000 mg | INTRAMUSCULAR | Status: DC | PRN

## 2024-01-27 MED ORDER — PROCHLORPERAZINE EDISYLATE 10 MG/2ML IJ SOLN: 10.0000 mg | Freq: Four times a day (QID) | INTRAMUSCULAR | Status: DC | PRN

## 2024-01-27 MED ORDER — POTASSIUM CHLORIDE 10 MEQ/100ML IV SOLN
10.0000 meq | INTRAVENOUS | Status: AC
Start: 2024-01-27 — End: 2024-01-27
  Administered 2024-01-27 (×2): 10 meq via INTRAVENOUS
  Filled 2024-01-27: qty 100

## 2024-01-27 MED ORDER — PANTOPRAZOLE SODIUM-NACL 80-0.9 MG/100ML-% IV SOLN: 8.0000 mg/h | INTRAVENOUS | Status: DC

## 2024-01-27 MED ORDER — LORAZEPAM 2 MG/ML IJ SOLN: 0.0000 mg | Freq: Four times a day (QID) | INTRAMUSCULAR | Status: AC

## 2024-01-27 MED ORDER — ONDANSETRON HCL 4 MG/2ML IJ SOLN
4.0000 mg | Freq: Once | INTRAMUSCULAR | Status: AC
  Administered 2024-01-27: 4 mg via INTRAVENOUS
  Filled 2024-01-27: qty 2

## 2024-01-27 MED ORDER — GADOBUTROL 1 MMOL/ML IV SOLN
9.0000 mL | Freq: Once | INTRAVENOUS | Status: AC | PRN
  Administered 2024-01-27: 9 mL via INTRAVENOUS

## 2024-01-27 MED ORDER — SODIUM CHLORIDE 0.9 % IV SOLN
1.0000 g | Freq: Once | INTRAVENOUS | Status: AC
  Administered 2024-01-27: 1 g via INTRAVENOUS
  Filled 2024-01-27: qty 10

## 2024-01-27 MED ORDER — SODIUM CHLORIDE 0.9 % IV SOLN
50.0000 ug/h | INTRAVENOUS | Status: DC
  Administered 2024-01-27 (×2): 50 ug/h via INTRAVENOUS
  Filled 2024-01-27 (×3): qty 1

## 2024-01-27 MED ORDER — PANTOPRAZOLE SODIUM 40 MG IV SOLR
40.0000 mg | Freq: Two times a day (BID) | INTRAVENOUS | Status: DC
  Administered 2024-01-28 (×2): 40 mg via INTRAVENOUS
  Filled 2024-01-27 (×2): qty 10

## 2024-01-27 MED ORDER — SODIUM CHLORIDE 0.9 % IV SOLN
INTRAVENOUS | Status: DC | PRN
Start: 2024-01-27 — End: 2024-01-27

## 2024-01-27 MED ORDER — THIAMINE HCL 100 MG/ML IJ SOLN
100.0000 mg | Freq: Every day | INTRAMUSCULAR | Status: DC
Start: 2024-01-27 — End: 2024-01-29
  Filled 2024-01-27: qty 2

## 2024-01-27 MED ORDER — SODIUM CHLORIDE 0.9 % IV SOLN
8.0000 mg/h | INTRAVENOUS | Status: DC
  Administered 2024-01-27: 8 mg/h via INTRAVENOUS
  Filled 2024-01-27 (×2): qty 20

## 2024-01-27 MED ORDER — ONDANSETRON HCL 4 MG/2ML IJ SOLN
4.0000 mg | Freq: Four times a day (QID) | INTRAMUSCULAR | Status: DC | PRN
  Administered 2024-01-27: 4 mg via INTRAVENOUS
  Filled 2024-01-27: qty 2

## 2024-01-27 MED ORDER — FOLIC ACID 1 MG PO TABS
1.0000 mg | ORAL_TABLET | Freq: Every day | ORAL | Status: DC
  Administered 2024-01-28: 1 mg via ORAL
  Filled 2024-01-27: qty 1

## 2024-01-27 MED ORDER — PANTOPRAZOLE INFUSION (NEW) - SIMPLE MED
8.0000 mg/h | INTRAVENOUS | Status: DC
  Administered 2024-01-27: 8 mg/h via INTRAVENOUS
  Filled 2024-01-27 (×4): qty 100

## 2024-01-27 MED ORDER — PROPOFOL 500 MG/50ML IV EMUL
INTRAVENOUS | Status: DC | PRN
  Administered 2024-01-27: 250 ug/kg/min via INTRAVENOUS

## 2024-01-27 MED ORDER — LORAZEPAM 2 MG/ML IJ SOLN
0.0000 mg | Freq: Two times a day (BID) | INTRAMUSCULAR | Status: DC
Start: 2024-01-29 — End: 2024-01-31

## 2024-01-27 MED ORDER — PROPOFOL 10 MG/ML IV BOLUS
INTRAVENOUS | Status: DC | PRN
  Administered 2024-01-27 (×2): 100 mg via INTRAVENOUS

## 2024-01-27 MED ORDER — THIAMINE MONONITRATE 100 MG PO TABS
100.0000 mg | ORAL_TABLET | Freq: Every day | ORAL | Status: DC
  Administered 2024-01-27 – 2024-01-28 (×2): 100 mg via ORAL
  Filled 2024-01-27 (×2): qty 1

## 2024-01-27 MED ORDER — HYDROCODONE-ACETAMINOPHEN 7.5-325 MG PO TABS
1.0000 | ORAL_TABLET | Freq: Three times a day (TID) | ORAL | Status: DC | PRN
  Administered 2024-01-27 – 2024-01-29 (×7): 1 via ORAL
  Filled 2024-01-27 (×7): qty 1

## 2024-01-27 MED ORDER — NADOLOL 20 MG PO TABS
20.0000 mg | ORAL_TABLET | Freq: Every day | ORAL | Status: DC
  Administered 2024-01-28: 20 mg via ORAL
  Filled 2024-01-27 (×2): qty 1

## 2024-01-27 MED ORDER — ADULT MULTIVITAMIN W/MINERALS CH
1.0000 | ORAL_TABLET | Freq: Every day | ORAL | Status: DC
  Administered 2024-01-28: 1 via ORAL
  Filled 2024-01-27: qty 1

## 2024-01-27 MED ORDER — SODIUM CHLORIDE 0.9 % IV BOLUS
1000.0000 mL | Freq: Once | INTRAVENOUS | Status: AC
  Administered 2024-01-27: 1000 mL via INTRAVENOUS

## 2024-01-27 MED ORDER — LORAZEPAM 1 MG PO TABS: 0.0000 mg | ORAL_TABLET | Freq: Four times a day (QID) | ORAL | Status: AC

## 2024-01-27 MED ORDER — GABAPENTIN 400 MG PO CAPS
800.0000 mg | ORAL_CAPSULE | Freq: Three times a day (TID) | ORAL | Status: DC
  Administered 2024-01-27 – 2024-01-28 (×6): 800 mg via ORAL
  Filled 2024-01-27 (×6): qty 2

## 2024-01-27 MED ORDER — PANTOPRAZOLE SODIUM 40 MG IV SOLR: 40.0000 mg | Freq: Two times a day (BID) | INTRAVENOUS | Status: DC

## 2024-01-27 MED ORDER — SODIUM CHLORIDE 0.9 % IV SOLN
50.0000 ug/h | INTRAVENOUS | Status: AC
  Administered 2024-01-28: 50 ug/h via INTRAVENOUS
  Filled 2024-01-27: qty 1

## 2024-01-27 MED ORDER — LORAZEPAM 1 MG PO TABS: 0.0000 mg | ORAL_TABLET | Freq: Two times a day (BID) | ORAL | Status: DC

## 2024-01-27 MED ORDER — SODIUM CHLORIDE 0.9% FLUSH
3.0000 mL | Freq: Two times a day (BID) | INTRAVENOUS | Status: DC
  Administered 2024-01-28 (×2): 3 mL via INTRAVENOUS

## 2024-01-27 MED ORDER — NICOTINE 21 MG/24HR TD PT24
21.0000 mg | MEDICATED_PATCH | Freq: Every day | TRANSDERMAL | Status: DC
  Administered 2024-01-27 – 2024-01-28 (×2): 21 mg via TRANSDERMAL
  Filled 2024-01-27 (×2): qty 1

## 2024-01-27 MED ORDER — PANTOPRAZOLE 80MG IVPB - SIMPLE MED: 80.0000 mg | Freq: Once | INTRAVENOUS | Status: DC

## 2024-01-27 MED ORDER — PANTOPRAZOLE INFUSION (NEW) - SIMPLE MED: 8.0000 mg/h | INTRAVENOUS | Status: DC

## 2024-01-27 MED ORDER — IOHEXOL 350 MG/ML SOLN
75.0000 mL | Freq: Once | INTRAVENOUS | Status: AC | PRN
Start: 2024-01-27 — End: 2024-01-27
  Administered 2024-01-27: 75 mL via INTRAVENOUS

## 2024-01-27 MED ORDER — PANTOPRAZOLE 80MG IVPB - SIMPLE MED
80.0000 mg | Freq: Once | INTRAVENOUS | Status: AC
  Administered 2024-01-27: 80 mg via INTRAVENOUS
  Filled 2024-01-27 (×2): qty 100

## 2024-01-27 MED ORDER — PANTOPRAZOLE 80MG IVPB - SIMPLE MED
80.0000 mg | Freq: Once | INTRAVENOUS | Status: DC
  Filled 2024-01-27: qty 100

## 2024-01-27 MED ORDER — MORPHINE SULFATE (PF) 4 MG/ML IV SOLN
4.0000 mg | Freq: Once | INTRAVENOUS | Status: AC
  Administered 2024-01-27: 4 mg via INTRAVENOUS
  Filled 2024-01-27: qty 1

## 2024-01-27 MED ORDER — AMISULPRIDE (ANTIEMETIC) 5 MG/2ML IV SOLN: 10.0000 mg | Freq: Once | INTRAVENOUS | Status: DC | PRN

## 2024-01-27 SURGICAL SUPPLY — 14 items

## 2024-01-27 NOTE — ED Notes (Signed)
Pt taken to Endo  

## 2024-01-27 NOTE — H&P (Addendum)
 History and Physical    Patient: Shane Holmes MVH:[id number] DOB: May 28, 1986 DOA: 01/27/2024 DOS: the patient was seen and examined on 01/27/2024 PCP: Salli Real, MD  Patient coming from: Home  Chief Complaint:  Chief Complaint  Patient presents with   Hematemesis   Chest Pain   HPI: Shane Holmes is a 38 y.o. male with medical history significant of diabetes mellitus type 2, asthma, alcohol abuse, Barrett's esophagus, gastric/duodenal and esophageal ulcer with prior GI bleed who has been experiencing hematemesis for the past two days, with episodes primarily occurring in the morning. The vomitus is described as 'straight blood' from the onset, with approximately three episodes of bloody vomiting. He experiences stomach soreness with each episode, localized to the middle of the abdomen. No recent use of ibuprofen or similar medications is reported. Denies having any shortness of breath or cough.  He had a bowel movement earlier today, which was black in color. He also notes a history of black stools prior to today, suggesting melena and indicating upper gastrointestinal bleeding.  He consumed alcohol recently, specifically one drink on 2 days ago and two drinks on yesterday, consisting of 16-ounce Club Tales with 10% alcohol content.  He experiences significant neuropathy and is currently on gabapentin and hydrocodone for pain management.  In the emergency department patient was noted to be tachycardic with stable vital signs.  Labs significant for hemoglobin 11.3, platelet count 61, potassium 3.4, CO2 30, BUN less than 5, creatinine 0.66, glucose 134, anion gap 17, AST 126, total bilirubin 5.7, alcohol level 175, and INR 1.4.  Chest x-ray noted no acute abnormality.  CT of the abdomen and pelvis noted pancreatic stranding, cirrhosis with portal hypertension tension in the nodules in the liver lithiasis with full without associated inflammation.  Patient was typed and screened for possible need  of blood products.  CIWA protocols were initiated.  Patient was given normal saline IV fluids and Rocephin IV as well as started on octreotide and Protonix drips.  [facility] have been consulted and patient was taken for EGD.  EGD revealed grade 2 esophageal varices, mucosal changes of the esophagus concerning for long segment Barrett's esophagus which was biopsied, portal gastropathy, and 3 centimeter hiatal hernia.    Review of Systems: As mentioned in the history of present illness. All other systems reviewed and are negative. Past Medical History:  Diagnosis Date   Alcohol abuse    Asthma    Back pain    Bulging lumbar disc    COVID    01/2021   Encounter for lumbar puncture    Hematochezia    Hypokalemia    Hyponatremia    Lower GI bleed    Neuropathy    Past Surgical History:  Procedure Laterality Date   BIOPSY  06/19/2022   Procedure: BIOPSY;  Surgeon: Sherrilyn Rist, MD;  Location: [facility] ENDOSCOPY;  Service: Gastroenterology;;   ESOPHAGOGASTRODUODENOSCOPY (EGD) WITH PROPOFOL N/A 06/19/2022   Procedure: ESOPHAGOGASTRODUODENOSCOPY (EGD) WITH PROPOFOL;  Surgeon: Sherrilyn Rist, MD;  Location: [facility] ENDOSCOPY;  Service: Gastroenterology;  Laterality: N/A;   Social History:  reports that he has been smoking cigarettes. He has never used smokeless tobacco. He reports current alcohol use of about 6.0 standard drinks of alcohol per week. He reports current drug use.  Allergies  Allergen Reactions   Lidocaine-Glycerin Other (See Comments)    Redness to application site    Family History  Problem Relation Age of Onset   Cirrhosis Mother  Cirrhosis Father    Cancer Maternal Grandfather    Cancer Paternal Grandfather    Diabetes Maternal Aunt    Hypertension Neg Hx     Prior to Admission medications   Medication Sig Start Date End Date Taking? Authorizing Provider  albuterol (VENTOLIN HFA) 108 (90 Base) MCG/ACT inhaler INHALE 2 PUFFS INTO THE LUNGS EVERY SIX HOURS AS  NEEDED FOR WHEEZING OR SHORTNESS OF BREATH. 01/26/21 08/18/24 Yes Ghimire, Werner Lean, MD  baclofen (LIORESAL) 10 MG tablet Take 10 mg by mouth 2 (two) times daily. 01/20/24  Yes [provider]  gabapentin (NEURONTIN) 800 MG tablet Take 1 tablet (800 mg total) by mouth 3 (three) times daily. 09/11/23  Yes Antony Madura, MD  HYDROcodone-acetaminophen (NORCO) 7.5-325 MG tablet Take 1 tablet by mouth 3 (three) times daily as needed. 01/09/24  Yes [provider]  metFORMIN (GLUCOPHAGE) 500 MG tablet Take 1 tablet (500 mg total) by mouth 2 (two) times daily with a meal. 02/27/23 07/06/24 Yes Hill, Manus Gunning, MD  pantoprazole (PROTONIX) 40 MG tablet TAKE 1 TABLET BY MOUTH TWICE A DAY 10/27/23  Yes Hill, Manus Gunning, MD  thiamine (VITAMIN B1) 100 MG tablet Take 1 tablet (100 mg total) by mouth daily. 01/06/23  Yes Antony Madura, MD  dicyclomine (BENTYL) 20 MG tablet Take 1 tablet (20 mg total) by mouth 2 (two) times daily. Patient not taking: Reported on 11/02/2019 03/02/14 11/03/19  [name], PA-C    Physical Exam: Vitals:   01/27/24 0159 01/27/24 0500 01/27/24 0809 01/27/24 0813  BP:  118/79 (!) 143/99 (!) 143/99  Pulse:  (!) 110 (!) 117 (!) 112  Resp:  16 18   Temp:   98.2 F (36.8 C)   SpO2:  100% 98%   Weight: 93.9 kg      Constitutional: Young male who appears to be in some distress Eyes: PERRL, lids and conjunctivae normal ENMT: Mucous membranes are moist. Posterior pharynx clear of any exudate or lesions.Normal dentition.  Neck: normal, supple, no masses, no thyromegaly Respiratory: clear to auscultation bilaterally, no wheezing, no crackles. Normal respiratory effort. No accessory muscle use.  Cardiovascular: Tachycardic no murmurs / rubs / gallops. No extremity edema. 2+ pedal pulses. No carotid bruits.  Abdomen: Midline abdominal tenderness.  No masses palpated.  Bowel sounds positive.  Musculoskeletal: no clubbing / cyanosis. No joint deformity upper and lower extremities.  Good ROM, no contractures. Normal muscle tone.  Skin: no rashes, lesions, ulcers. No induration Neurologic: CN 2-12 grossly intact. Strength 5/5 in all 4.  Psychiatric: Normal judgment and insight. Alert and oriented x 3.  Anxious mood  Data Reviewed:  EKG revealed sinus tachycardia 121 bpm with biatrial enlargement.  Reviewed labs, imaging, and pertinent records as documented.  Assessment and Plan:  Hematemesis Acute blood loss anemia Patient presents with a 2-day history of vomiting blood. Black stools reported indicating upper gastrointestinal bleeding.  Hemoglobin dropped to 11.3, but have been 14.7 when last checked 02/13/2023.  He was typed and screened for possible need of blood products.  Shane Holmes was placed on Protonix and octreotide drips and given prophylactic antibiotics of Rocephin IV.  Patient was taken for EGD which revealed  revealed grade 2 esophageal varices, mucosal changes concerning for long segment Barrett's esophagus which was biopsied, portal gastropathy, and 3 centimeter hiatal hernia.  -Admit to a progressive bed  -Aspiration precautions with elevation head of bed -Clear liquid diet -Serial H&H -Continue octreotide drip until tomorrow morning  -Protonix -  Appreciate GI consultative services we will follow-up for any recommendations  Hypokalemia Acute.  Patient's initial potassium noted to be 3.  Patient had been given potassium chloride IV. 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  Initial labs revealed AST 126, ALT 27, total bilirubin 5.7, lipase 40, and PT 17.3.  Maddrey discriminant factor was not greater than 32 to suggest need of IV steroids.  The AST to ALT ratio is consistent with alcohol abuse.  CT imaging noted mild peripancreatic stranding given concern for possible pancreatitis, cirrhosis with portal hypertension, and innumerable nodules in the  liver.  EGD noted esophageal varices. -Recheck CMP in a.m. -Start nadolol 20 mg daily in a.m.  Alcohol abuse On admission alcohol level noted to be 175.  Patient noted what he drank in the last 2 days, but did not really qualify how much he drinks on a daily basis.  Patient does appear to show some signs of alcohol withdrawal.  -CIWA protocols initiated -MVI, folic acid, thiamine -Continue to counsel need of cessation of alcohol use  Thrombocytopenia Acute on chronic.  Platelet count noted to be 61.  Secondary to patient's history of cirrhosis. -Continue to monitor  Peripheral neuropathy -Continue gabapentin  Tobacco abuse Patient reports smoking cigarettes. -Nicotine patch offered  GERD  EGD noted concern for Barrett's esophagus. -Follow-up biopsies -Continue Protonix  DVT prophylaxis: SCDs Advance Care Planning:   Code Status: Full Code   Consults: GI  Family Communication: None  Severity of Illness: The appropriate patient status for this patient is INPATIENT. Inpatient status is judged to be reasonable and necessary in order to provide the required intensity of service to ensure the patient's safety. [name] presenting symptoms, physical exam findings, and initial radiographic and laboratory data in the context of their chronic comorbidities is felt to place them at high risk for further clinical deterioration. Furthermore, it is not anticipated that the patient will be medically stable for discharge from the hospital within 2 midnights of admission.   * I certify that at the point of admission it is my clinical judgment that the patient will require inpatient hospital care spanning beyond 2 midnights from the point of admission due to high intensity of service, high risk for further deterioration and high frequency of surveillance required.*  Author: Clydie Braun, MD 01/27/2024 8:49 AM  For on call review [email or web address].

## 2024-01-27 NOTE — Anesthesia Preprocedure Evaluation (Addendum)
 Anesthesia Evaluation  Patient identified by MRN, date of birth, ID band Patient awake    Reviewed: Allergy & Precautions, NPO status , Patient's Chart, lab work & pertinent test results  Airway Mallampati: II  TM Distance: >3 FB Neck ROM: Full    Dental  (+) Poor Dentition, Dental Advisory Given   Pulmonary asthma , Current Smoker   breath sounds clear to auscultation       Cardiovascular negative cardio ROS  Rhythm:Regular Rate:Tachycardia     Neuro/Psych  Neuromuscular disease  negative psych ROS   GI/Hepatic Neg liver ROS,GERD  Medicated,,  Endo/Other  diabetes, Type 2, Oral Hypoglycemic Agents    Renal/GU negative Renal ROS     Musculoskeletal negative musculoskeletal ROS (+)    Abdominal   Peds  Hematology  (+) Blood dyscrasia, anemia   Anesthesia Other Findings   Reproductive/Obstetrics                             Anesthesia Physical Anesthesia Plan  ASA: 2  Anesthesia Plan: MAC   Post-op Pain Management: Minimal or no pain anticipated   Induction: Intravenous  PONV Risk Score and Plan: 0 and Propofol infusion  Airway Management Planned: Natural Airway and Nasal Cannula  Additional Equipment: None  Intra-op Plan:   Post-operative Plan:   Informed Consent: I have reviewed the patients History and Physical, chart, labs and discussed the procedure including the risks, benefits and alternatives for the proposed anesthesia with the patient or authorized representative who has indicated his/her understanding and acceptance.       Plan Discussed with: CRNA  Anesthesia Plan Comments:        Anesthesia Quick Evaluation

## 2024-01-27 NOTE — Anesthesia Postprocedure Evaluation (Addendum)
 Anesthesia Post Note  Patient: Shane Holmes  Procedure(s) Performed: ESOPHAGOGASTRODUODENOSCOPY (EGD) WITH PROPOFOL BIOPSY     Patient location during evaluation: PACU Anesthesia Type: MAC Level of consciousness: awake and alert Pain management: pain level controlled Vital Signs Assessment: post-procedure vital signs reviewed and stable Respiratory status: spontaneous breathing, nonlabored ventilation, respiratory function stable and patient connected to nasal cannula oxygen Cardiovascular status: stable and blood pressure returned to baseline Postop Assessment: no apparent nausea or vomiting Anesthetic complications: no  No notable events documented.  Last Vitals:  Vitals:   01/27/24 1210 01/27/24 1333  BP: 115/77   Pulse: (!) 114   Resp: 18   Temp:  36.8 C  SpO2: 93%                  Shelton Silvas

## 2024-01-27 NOTE — ED Notes (Signed)
 Pt not scanned due to scanner being left off the charger and not functioning.

## 2024-01-27 NOTE — ED Notes (Signed)
 Pt states he has a lot of mucus in his throat and it gags him so he vomited it up. Medications given for nausea due to witnessed dry heaving. Pt then stated he needed his medication for pain and neuropathy. Pt advised to wait several minutes and give the zofran time to work. Pt stated he was no longer nauseous. Medications given. Pt tolerated well.

## 2024-01-27 NOTE — Consult Note (Addendum)
 Consultation  Referring Provider: TRH/ Katrinka Blazing Primary Care Physician:  Salli Real, MD Primary Gastroenterologist:  unassigned  Reason for Consultation:  hematemesis  HPI: Shane Holmes is a 38 y.o. male, known to the GI service from admission in July 2023 when he also presented with hematemesis.  Patient has history of EtOH use disorder, chronic back pain, peripheral neuropathy and asthma. He presented to the emergency room early in the morning hours today stating that he had been having intermittent vomiting of bright red blood over the past couple of days.  He says usually it is happening early in the mornings, and yesterday he vomited up what he describes as a large amount of dark red blood with clots.  He is not really nauseated prior to these episodes, says it just comes up. He has had small amounts of hematemesis since arrival to the ER.  He also endorses black stools over the past couple of days.  He says he got scared because he noticed that his heart rate was high.  Not really been having any recent abdominal pain, no heartburn or indigestion no dysphagia or odynophagia.  He had been started on twice daily PPI when he was here in 2023 and states that he is still taking twice daily Protonix. Denies any aspirin or NSAID use. He does have history of chronic daily EtOH use, he is vague today says he does not drink alcohol every day but did have "a couple on Monday evening".  He denies issues with withdrawal when stopping EtOH.  Workup in the ER earlier today with CT of the abdomen and pelvis shows a heterogeneous enlarged liver lobulated with innumerable nodules in the liver, MRI recommended, cholelithiasis, recanalized periumbilical vein, subtle peripancreatic stranding and enlarged spleen.  Labs today WBC 6.5/hemoglobin 11.3/hematocrit 31.6/platelets 61/MCV 87.5 Potassium 3.0 BUN 25/creatinine 0.66 INR 1.4 T. bili 5.7/alk phos 105/AST 126 ALT 27 EtOH level 175  EGD in July 2023  revealed grade 1 esophageal varices, 1 to 2 cm hiatal hernia there was 1 small superficial esophageal ulcer nonbleeding at the GE junction, changes consistent with long segment Barrett's and evidence of portal gastropathy as well as multiple nonbleeding duodenal ulcers. Biopsies of the stomach showed antral erythema, no H. pylori and no intestinal metaplasia.  He has been hemodynamically stable here, is tachycardic.  MELD 3.0: 18 at 01/27/2024  2:58 AM MELD-Na: 18 at 01/27/2024  2:58 AM Calculated from: Serum Creatinine: 0.6 mg/dL (Using min of 1 mg/dL) at 1/61/0960  4:54 AM Serum Sodium: 136 mmol/L at 01/27/2024  2:58 AM Total Bilirubin: 5.7 mg/dL at 0/98/1191  4:78 AM Serum Albumin: 3.4 g/dL at 2/95/6213  0:86 AM INR(ratio): 1.4 at 01/27/2024  2:58 AM Age at listing (hypothetical): 37 years Sex: Male at 01/27/2024  2:58 AM     Past Medical History:  Diagnosis Date   Alcohol abuse    Asthma    Back pain    Bulging lumbar disc    COVID    01/2021   Encounter for lumbar puncture    Hematochezia    Hypokalemia    Hyponatremia    Lower GI bleed    Neuropathy     Past Surgical History:  Procedure Laterality Date   BIOPSY  06/19/2022   Procedure: BIOPSY;  Surgeon: Sherrilyn Rist, MD;  Location[facility] ENDOSCOPY;  Service: Gastroenterology;;   ESOPHAGOGASTRODUODENOSCOPY (EGD) WITH PROPOFOL N/A 06/19/2022   Procedure: ESOPHAGOGASTRODUODENOSCOPY (EGD) WITH PROPOFOL;  Surgeon: Sherrilyn Rist, MD;  Location: [facility] ENDOSCOPY;  Service: Gastroenterology;  Laterality: N/A;    Prior to Admission medications   Medication Sig Start Date End Date Taking? Authorizing Provider  albuterol (VENTOLIN HFA) 108 (90 Base) MCG/ACT inhaler INHALE 2 PUFFS INTO THE LUNGS EVERY SIX HOURS AS NEEDED FOR WHEEZING OR SHORTNESS OF BREATH. 01/26/21 08/18/24 Yes Ghimire, Werner Lean, MD  baclofen (LIORESAL) 10 MG tablet Take 10 mg by mouth 2 (two) times daily. 01/20/24  Yes [provider]  gabapentin  (NEURONTIN) 800 MG tablet Take 1 tablet (800 mg total) by mouth 3 (three) times daily. 09/11/23  Yes Antony Madura, MD  HYDROcodone-acetaminophen (NORCO) 7.5-325 MG tablet Take 1 tablet by mouth 3 (three) times daily as needed. 01/09/24  Yes [provider]  metFORMIN (GLUCOPHAGE) 500 MG tablet Take 1 tablet (500 mg total) by mouth 2 (two) times daily with a meal. 02/27/23 07/06/24 Yes Hill, Manus Gunning, MD  pantoprazole (PROTONIX) 40 MG tablet TAKE 1 TABLET BY MOUTH TWICE A DAY 10/27/23  Yes Hill, Manus Gunning, MD  thiamine (VITAMIN B1) 100 MG tablet Take 1 tablet (100 mg total) by mouth daily. 01/06/23  Yes Antony Madura, MD  dicyclomine (BENTYL) 20 MG tablet Take 1 tablet (20 mg total) by mouth 2 (two) times daily. Patient not taking: Reported on 11/02/2019 03/02/14 11/03/19  [name], PA-C    Current Facility-Administered Medications  Medication Dose Route Frequency Provider Last Rate Last Admin   baclofen (LIORESAL) tablet 10 mg  10 mg Oral BID Madelyn Flavors A, MD   10 mg at 01/27/24 0921   [START ON 01/28/2024] cefTRIAXone (ROCEPHIN) 2 g in sodium chloride 0.9 % 100 mL IVPB  2 g Intravenous Q24H Smith, Rondell A, MD       gabapentin (NEURONTIN) capsule 800 mg  800 mg Oral TID Madelyn Flavors A, MD   800 mg at 01/27/24 0915   HYDROcodone-acetaminophen (NORCO) 7.5-325 MG per tablet 1 tablet  1 tablet Oral TID PRN Clydie Braun, MD   1 tablet at 01/27/24 0915   LORazepam (ATIVAN) injection 0-4 mg  0-4 mg Intravenous Q6H Horton, Mayer Masker, MD       Or   LORazepam (ATIVAN) tablet 0-4 mg  0-4 mg Oral Q6H Horton, Mayer Masker, MD       [START ON 01/29/2024] LORazepam (ATIVAN) injection 0-4 mg  0-4 mg Intravenous Q12H Horton, Mayer Masker, MD       Or   Melene Muller ON 01/29/2024] LORazepam (ATIVAN) tablet 0-4 mg  0-4 mg Oral Q12H Horton, Mayer Masker, MD       octreotide (SANDOSTATIN) 500 mcg in sodium chloride 0.9 % 250 mL (2 mcg/mL) infusion  50 mcg/hr Intravenous Continuous Roxy Horseman, PA-C 25  mL/hr at 01/27/24 0345 50 mcg/hr at 01/27/24 0345   ondansetron (ZOFRAN) injection 4 mg  4 mg Intravenous Q6H PRN Madelyn Flavors A, MD   4 mg at 01/27/24 0912   pantoprozole (PROTONIX) 80 mg /NS 100 mL infusion  8 mg/hr Intravenous Continuous Roxy Horseman, PA-C 10 mL/hr at 01/27/24 0416 8 mg/hr at 01/27/24 0416   prochlorperazine (COMPAZINE) injection 10 mg  10 mg Intravenous Q6H PRN Madelyn Flavors A, MD       sodium chloride flush (NS) 0.9 % injection 3 mL  3 mL Intravenous Q12H Smith, Rondell A, MD       thiamine (VITAMIN B1) tablet 100 mg  100 mg Oral Daily Horton, Mayer Masker, MD   100 mg at 01/27/24 [phone]  Or   thiamine (VITAMIN B1) injection 100 mg  100 mg Intravenous Daily Horton, Mayer Masker, MD       Current Outpatient Medications  Medication Sig Dispense Refill   albuterol (VENTOLIN HFA) 108 (90 Base) MCG/ACT inhaler INHALE 2 PUFFS INTO THE LUNGS EVERY SIX HOURS AS NEEDED FOR WHEEZING OR SHORTNESS OF BREATH. 18 g 0   baclofen (LIORESAL) 10 MG tablet Take 10 mg by mouth 2 (two) times daily.     gabapentin (NEURONTIN) 800 MG tablet Take 1 tablet (800 mg total) by mouth 3 (three) times daily. 90 tablet 11   HYDROcodone-acetaminophen (NORCO) 7.5-325 MG tablet Take 1 tablet by mouth 3 (three) times daily as needed.     metFORMIN (GLUCOPHAGE) 500 MG tablet Take 1 tablet (500 mg total) by mouth 2 (two) times daily with a meal. 60 tablet 2   pantoprazole (PROTONIX) 40 MG tablet TAKE 1 TABLET BY MOUTH TWICE A DAY 60 tablet 3   thiamine (VITAMIN B1) 100 MG tablet Take 1 tablet (100 mg total) by mouth daily. 30 tablet 11    Allergies as of 01/27/2024 - Review Complete 01/27/2024  Allergen Reaction Noted   Lidocaine-glycerin Other (See Comments) 02/01/2021    Family History  Problem Relation Age of Onset   Cirrhosis Mother    Cirrhosis Father    Cancer Maternal Grandfather    Cancer Paternal Grandfather    Diabetes Maternal Aunt    Hypertension Neg Hx     Social History    Socioeconomic History   Marital status: Legally Separated    Spouse name: Not on file   Number of children: 2   Years of education: Not on file   Highest education level: Not on file  Occupational History   Not on file  Tobacco Use   Smoking status: Some Days    Types: Cigarettes   Smokeless tobacco: Never   Tobacco comments:    Close to a pack at day--1/2 pack at day 09/11/23  Vaping Use   Vaping status: Never Used  Substance and Sexual Activity   Alcohol use: Yes    Alcohol/week: 6.0 standard drinks of alcohol    Types: 6 Cans of beer per week    Comment: occas   Drug use: Yes    Comment: cocaine positive 01/2022/ CbD   Sexual activity: Yes  Other Topics Concern   Not on file  Social History Narrative   Right handed   Caffeine 1 -3 can a day   Live in one level home      Are you currently employed ?    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   Who lives with you? Girlfriend   What type of home do you live in: 1 story or 2 story? one       Social Drivers of Corporate investment banker Strain: Low Risk  (02/01/2021)   Overall Financial Resource Strain (CARDIA)    Difficulty of Paying Living Expenses: Not hard at all  Food Insecurity: No Food Insecurity (10/23/2022)   Hunger Vital Sign    Worried About Running Out of Food in the Last Year: Never true    Ran Out of Food in the Last Year: Never true  Transportation Needs: No Transportation Needs (10/23/2022)   PRAPARE - Administrator, Civil Service (Medical): No    Lack of Transportation (Non-Medical): No  Physical Activity: Not on file  Stress: Not  on file  Social Connections: Unknown (04/16/2022)   Received from [facility]   Social Network    Social Network: Not on file  Intimate Partner Violence: Not At Risk (10/23/2022)   Humiliation, Afraid, Rape, and Kick questionnaire    Fear of Current or Ex-Partner: No    Emotionally Abused: No     Physically Abused: No    Sexually Abused: No    Review of Systems: Pertinent positive and negative review of systems were noted in the above HPI section.  All other review of systems was otherwise negative.   Physical Exam: Vital signs in last 24 hours: Temp:  [98.2 F (36.8 C)-98.3 F (36.8 C)] 98.2 F (36.8 C) (02/25 0809) Pulse Rate:  [110-120] 112 (02/25 0813) Resp:  [16-19] 18 (02/25 0809) BP: (118-143)/(79-99) 143/99 (02/25 0813) SpO2:  [95 %-100 %] 98 % (02/25 0809) Weight:  [93 kg-93.9 kg] 93 kg (02/25 0919)   General:   Alert,  Well-developed, well-nourished, white male pleasant and cooperative in NAD Head:  Normocephalic and atraumatic. Eyes:  Sclera clear, no icterus.   Conjunctiva pink. Ears:  Normal auditory acuity. Nose:  No deformity, discharge,  or lesions. Mouth:  No deformity or lesions.   Neck:  Supple; no masses or thyromegaly. Lungs:  Clear throughout to auscultation.   No wheezes, crackles, or rhonchi.  Heart:  tachy Regular rate and rhythm; no murmurs, clicks, rubs,  or gallops. Abdomen:  Soft,nontender,, obese, liver palpable 2 to 3 fingerbreadths below the right costal margin nodular, unable to palpate spleen no fluid wave Rectal: Not done, documented heme positive Msk:  Symmetrical without gross deformities. . Pulses:  Normal pulses noted. Extremities:  Without clubbing or edema. Neurologic:  Alert and  oriented x4;  grossly normal neurologically. Skin:  Intact without significant lesions or rashes.. Psych:  Alert and cooperative. Normal mood and affect.  Intake/Output from previous day: 02/24 0701 - 02/25 0700 In: 1400 [IV Piggyback:1400] Out: -  Intake/Output this shift: No intake/output data recorded.  Lab Results: Recent Labs    01/27/24 0202  WBC 6.5  HGB 11.3*  HCT 31.6*  PLT 61*   BMET Recent Labs    01/27/24 0202 01/27/24 0258  NA 137 136  K 3.4* 3.0*  CL 90* 90*  CO2 30 22  GLUCOSE 134* 132*  BUN <5* <5*  CREATININE  0.66 0.60*  CALCIUM 8.8* 8.7*   LFT Recent Labs    01/27/24 0258  PROT 7.2  ALBUMIN 3.4*  AST 126*  ALT 27  ALKPHOS 105  BILITOT 5.7*   PT/INR Recent Labs    01/27/24 0258  LABPROT 17.3*  INR 1.4*   Hepatitis Panel No results for input(s): "HEPBSAG", "HCVAB", "HEPAIGM", "HEPBIGM" in the last 72 hours.    IMPRESSION:  #32 38 year old white male with history of chronic EtOH use disorder, complicated by decompensated cirrhosis, who presents with intermittent hematemesis over the past couple of days, patient reports a large volume episode yesterday morning of dark red blood and clots.  Has noted dark stools over the past few days as well Prior EGD in July 2023 had shown grade 1 esophageal varices, portal gastropathy, probable long segment of Barrett's, hiatal hernia 1 small nonbleeding esophageal ulcer and several nonbleeding duodenal ulcers.  Has been on twice daily PPI, denies aspirin or NSAID use, vague about regularity of EtOH use currently.  EtOH level 175 on admission  Etiology of the hematemesis is not clear rule out possible variceal versus  secondary to peptic ulcer disease, portal gastropathy, ulcerative esophagitis.  #2 normocytic anemia secondary to acute GI blood loss #3 thrombocytopenia secondary to cirrhosis #4.  Hypokalemia correcting with IV potassium #5.  Elevated LFTs consistent with component of EtOH hepatitis #6 multiple hepatic nodules noted on CT  PLAN: Keep n.p.o. Has already been started on IV PPI twice daily and IV octreotide infusion Rocephin daily Continue to trend hemoglobin every 6 to 8 hours and transfuse as indicated Patient will be scheduled for EGD with Dr. Myrtie Neither today.  I discussed procedure in detail with the patient including indications risk and benefits and he is agreeable to proceed CT reviewed and no significant ascites, no need for paracentesis Will schedule for MRI of the liver to further evaluate the nodules, and check AFP GI will  follow with you   Amy EsterwoodPA-C  01/27/2024, 9:46 AM  I have taken an interval history, thoroughly reviewed the chart and examined the patient. I agree with the Advanced Practitioner's note, impression and recommendations, and have recorded additional findings, impressions and recommendations below. I performed a substantive portion of this encounter (>50% time spent), including a complete performance of the medical decision making.  My additional thoughts are as follows: Additional exam findings-palmar erythema and spider nevi as well as tenderness over his enlarged liver.  38 year old man known to [place] with a history of alcohol related cirrhosis and ongoing alcohol use (positive ethanol level on admission) with thrombocytopenia and coagulopathy here with recurrent upper GI bleeding.  Known duodenal ulcers and esophageal varices.  H. pylori negative on 2023 EGD.  Last endoscopy had esophageal findings suggestive of long segment Barrett's, though no intestinal metaplasia seen on limited biopsies.  Cirrhotic findings with portal hypertension on CT abdomen and pelvis, hiatal hernia with a small amount of ascitic fluid around that herniated portion of the stomach, otherwise no free fluid in the abdomen or pelvis.  He is hemodynamically stable, I do not think he needs antibiotics since there is such a limited amount of ascites around the herniated upper stomach, he has good IV access, he is on pantoprazole and octreotide.  Plan is for an upper endoscopy soon.  Procedure described in detail along with risks and benefits and he was agreeable.  The benefits and risks of the planned procedure(s) were described in detail with the patient or (when appropriate) their health care proxy.  Risks were outlined as including, but not limited to, bleeding, infection, perforation, adverse medication reaction leading to cardiac or pulmonary decompensation, pancreatitis (if ERCP).  The limitation of incomplete mucosal  visualization was also discussed.  No guarantees or warranties were given.  He is aware that endoscopic intervention may be necessary, including but not limited to, endoscopic treatment of ulcers with injection, cauterization or clips and the possibility of esophageal variceal banding. Patient at increased risk for cardiopulmonary complications of procedure due to medical comorbidities.  Monitor this patient closely for alcohol withdrawal.   Charlie Pitter III Office:[phone]

## 2024-01-27 NOTE — ED Triage Notes (Signed)
 Pt in with hematemesis x 2 days, tachy in 120's. Pt reports chest burning, hx of peptic ulcer and takes Protonix daily. Pt is also reporting hemoptysis since this evening

## 2024-01-27 NOTE — Op Note (Signed)
 [facility] Patient Name: Shane Holmes Procedure Date : 01/27/2024 MRN: [id number] Attending MD: Starr Lake. Myrtie Neither , MD, [id number] Date of Birth: Feb 06, 1986 CSN: [id number] Age: 38 Admit Type: Inpatient Procedure:                Upper GI endoscopy Indications:              Acute post hemorrhagic anemia, Hematemesis,                            Esophageal varices Providers:                Sherilyn Cooter L. Myrtie Neither, MD, Lorenza Evangelist, RN, Rhodia Albright, Technician Referring MD:             [facility] hospitalist Medicines:                Monitored Anesthesia Care Complications:            No immediate complications. Estimated Blood Loss:     Estimated blood loss was minimal. Procedure:                Pre-Anesthesia Assessment:                           - Prior to the procedure, a History and Physical                            was performed, and patient medications and                            allergies were reviewed. [name] tolerance of                            previous anesthesia was also reviewed. The risks                            and benefits of the procedure and the sedation                            options and risks were discussed with the patient.                            All questions were answered, and informed consent                            was obtained. Prior Anticoagulants: The patient has                            taken no anticoagulant or antiplatelet agents. ASA                            Grade Assessment: IV - A patient with severe  systemic disease that is a constant threat to life.                            After reviewing the risks and benefits, the patient                            was deemed in satisfactory condition to undergo the                            procedure.                           After obtaining informed consent, the endoscope was                            passed under direct  vision. Throughout the                            procedure, the patient's blood pressure, pulse, and                            oxygen saturations were monitored continuously. The                            [id number]-H190 ([id number]) Olympus endoscope was introduced                            through the mouth, and advanced to the second part                            of duodenum. The upper GI endoscopy was                            accomplished without difficulty. The patient                            tolerated the procedure fairly well. Scope In: Scope Out: Findings:      Circumferential salmon-colored mucosa was present from 25 to 35 cm. No       other visible abnormalities were present. The maximum longitudinal       extent of these esophageal mucosal changes was 10 cm in length. Biopsies       were taken with a cold forceps for histology. Squamocolumnar junction at       25 cm with salmon-colored mucosa distal to that. Difficult to examine       the full extent of that mucosa given the varices that begin several       centimeters distal to the upper extent of this finding. Only 2 cold       biopsy specimens were taken near the upper extent of this due to the       risk of bleeding from thrombocytopenia and coagulopathy as well as the       potential for bleeding if they should be taken more distal to that with       the varices that were seen.  Examined under WL and NBI with no raised or suspicious lesions.      Grade II varices were found in the middle third of the esophagus and in       the lower third of the esophagus. They were large in size. No platelet       plugs or active bleeding seen. Some of the varices had overlying ectatic       tortuous vessels, and there were some areas of friable mucosa. It was       difficult to determine the exact upper limit of the gastric folds since       the squamocolumnar junction is displaced proximal to that as noted       above. However,  some of these esophageal varices appear to extend onto       those upper gastric folds in the herniated portion of the stomach.      A small hiatal hernia was present.      Moderate portal hypertensive gastropathy was found in the entire       examined stomach. Patchy friable mucosa with scope contact. No fresh or       old blood was seen in the upper GI tract upon initial passage of the       scope. No gastric varices were seen.      The examined duodenum was normal. (Other than friable mucosa with scant       self-limited contact bleeding and the duodenal sweep) see photo Impression:               - Salmon-colored mucosa suspicious for long-segment                            Barrett's esophagus. Biopsied.                           - Grade II esophageal varices.                           - 3 cm hiatal hernia.                           - Portal hypertensive gastropathy.                           - Normal examined duodenum.                           This patient appears to have had self-limited                            gastric and/or esophageal mucosal bleeding in the                            setting of severe portal hypertensive changes and                            esophageal varices that may be extending into the                            upper gastric folds. No definite  stigmata of                            esophageal variceal bleeding requiring intervention. Recommendation:           - Return patient to hospital ward for ongoing care.                           - Clear liquid diet.                           - Continue octreotide until tomorrow morning, then                            start nonselective beta-blocker (nadolol or                            carvedilol) for indefinite use.                           CBC tomorrow                           Complete alcohol abstinence is again strongly                            advised.                           Follow-up esophageal  biopsy results Procedure Code(s):        --- Professional ---                           [phone], Esophagogastroduodenoscopy, flexible,                            transoral; with biopsy, single or multiple Diagnosis Code(s):        --- Professional ---                           K22.89, Other specified disease of esophagus                           I85.00, Esophageal varices without bleeding                           K44.9, Diaphragmatic hernia without obstruction or                            gangrene                           K76.6, Portal hypertension                           K31.89, Other diseases of stomach and duodenum                           D62, Acute posthemorrhagic anemia  K92.0, Hematemesis CPT copyright 2022 American Medical Association. All rights reserved. The codes documented in this report are preliminary and upon coder review may  be revised to meet current compliance requirements. Abrahm Mancia L. Myrtie Neither, MD 01/27/2024 12:05:24 PM This report has been signed electronically. Number of Addenda: 0

## 2024-01-27 NOTE — ED Notes (Signed)
 Pt actively vomiting, admitting MD messaged

## 2024-01-27 NOTE — Transfer of Care (Signed)
 Immediate Anesthesia Transfer of Care Note  Patient: Shane Holmes  Procedure(s) Performed: ESOPHAGOGASTRODUODENOSCOPY (EGD) WITH PROPOFOL BIOPSY  Patient Location: PACU and Endoscopy Unit  Anesthesia Type:MAC  Level of Consciousness: drowsy  Airway & Oxygen Therapy: Patient Spontanous Breathing and Patient connected to face mask oxygen  Post-op Assessment: Report given to RN and Post -op Vital signs reviewed and stable  Post vital signs: Reviewed and stable  Last Vitals:  Vitals Value Taken Time  BP    Temp    Pulse    Resp    SpO2      Last Pain:  Vitals:   01/27/24 1158  TempSrc:   PainSc: Asleep      Patients Stated Pain Goal: 0 (01/27/24 1052)  Complications: No notable events documented.

## 2024-01-27 NOTE — Plan of Care (Signed)

## 2024-01-27 NOTE — ED Notes (Signed)
 Patient transported to MRI

## 2024-01-27 NOTE — ED Notes (Signed)
 GI at bedside

## 2024-01-27 NOTE — ED Provider Triage Note (Addendum)
 Emergency Medicine Provider Triage Evaluation Note  Shane Holmes , a 38 y.o. male  was evaluated in triage.  Pt complains of hemoptysis and hematemesis. Hx of Barrett's esophagus, esophageal ulcer and grade 1 esophageal varices.  Takes protonix.  Has had worsening pain over the past couple of days.    Review of Systems  Positive: Chest pain, hemoptysis, hematemesis Negative: fever  Physical Exam  BP 123/87 (BP Location: Right Arm)   Pulse (!) 120   Temp 98.3 F (36.8 C)   Resp 19   Wt 93.9 kg   SpO2 95%   BMI 28.87 kg/m  Gen:   Awake, uncomfortable appearing Resp:  Normal effort  MSK:   Moves extremities without difficulty  Other:    Medical Decision Making  Medically screening exam initiated at 2:46 AM.  Appropriate orders placed.  Shane Holmes was informed that the remainder of the evaluation will be completed by another provider, this initial triage assessment does not replace that evaluation, and the importance of remaining in the ED until their evaluation is complete.     Shane Horseman, PA-C 01/27/24 0248    Shane Horseman, PA-C 01/27/24 [phone]

## 2024-01-27 NOTE — ED Provider Notes (Signed)
 [place] EMERGENCY DEPARTMENT AT [facility] Provider Note   CSN: [id number] Arrival date & time: 01/27/24  0113     History  Chief Complaint  Patient presents with   Hematemesis   Chest Pain    Shane CHAVARRIA is a 38 y.o. male.  HPI     This is a 38 year old male with a history of ulcers and alcoholic gastritis who presents with hematemesis.  Patient reports 2-day history of vomiting blood.  Reports that he vomited a large amount of bright red blood earlier this morning.  This is usually worse in the morning.  Reports intermittent upper abdominal discomfort.  No chest pain.  Reports that he no longer drinks daily but "occasionally."  He will not further elaborate or quantify his drinking.  He is not on any blood thinners.  Has not noted any bloody stools or dark stools.  Home Medications Prior to Admission medications   Medication Sig Start Date End Date Taking? Authorizing Provider  albuterol (VENTOLIN HFA) 108 (90 Base) MCG/ACT inhaler INHALE 2 PUFFS INTO THE LUNGS EVERY SIX HOURS AS NEEDED FOR WHEEZING OR SHORTNESS OF BREATH. 01/26/21 09/11/23  Ghimire, Werner Lean, MD  cholecalciferol (VITAMIN D3) 25 MCG (1000 UNIT) tablet Take 50,000 Units by mouth daily.    [provider]  clotrimazole (LOTRIMIN) 1 % cream Apply to affected area 2 times daily Patient not taking: Reported on 03/06/2023 02/05/23   Army Melia A, PA-C  gabapentin (NEURONTIN) 800 MG tablet Take 1 tablet (800 mg total) by mouth 3 (three) times daily. 09/11/23   Antony Madura, MD  metFORMIN (GLUCOPHAGE) 500 MG tablet Take 1 tablet (500 mg total) by mouth 2 (two) times daily with a meal. 02/27/23 09/11/23  Antony Madura, MD  pantoprazole (PROTONIX) 40 MG tablet TAKE 1 TABLET BY MOUTH TWICE A DAY 10/27/23   Antony Madura, MD  thiamine (VITAMIN B1) 100 MG tablet Take 1 tablet (100 mg total) by mouth daily. 01/06/23   Antony Madura, MD  dicyclomine (BENTYL) 20 MG tablet Take 1 tablet (20 mg total) by  mouth 2 (two) times daily. Patient not taking: Reported on 11/02/2019 03/02/14 11/03/19  [name], PA-C      Allergies    Lidocaine-glycerin    Review of Systems   Review of Systems  Constitutional:  Negative for fever.  Respiratory:  Negative for shortness of breath.   Cardiovascular:  Negative for chest pain.  Gastrointestinal:  Positive for abdominal pain, nausea and vomiting.  All other systems reviewed and are negative.   Physical Exam Updated Vital Signs BP 118/79   Pulse (!) 110   Temp 98.3 F (36.8 C)   Resp 16   Wt 93.9 kg   SpO2 100%   BMI 28.87 kg/m  Physical Exam Vitals and nursing note reviewed.  Constitutional:      Appearance: He is well-developed. He is ill-appearing. He is not toxic-appearing.  HENT:     Head: Normocephalic and atraumatic.  Eyes:     Pupils: Pupils are equal, round, and reactive to light.     Comments: Scleral icterus noted  Cardiovascular:     Rate and Rhythm: Regular rhythm. Tachycardia present.     Heart sounds: Normal heart sounds. No murmur heard. Pulmonary:     Effort: Pulmonary effort is normal. No respiratory distress.     Breath sounds: Normal breath sounds. No wheezing.  Abdominal:     General: Bowel sounds are normal.  Palpations: Abdomen is soft.     Tenderness: There is abdominal tenderness. There is no guarding or rebound.  Musculoskeletal:     Cervical back: Neck supple.  Lymphadenopathy:     Cervical: No cervical adenopathy.  Skin:    General: Skin is warm and dry.  Neurological:     Mental Status: He is alert and oriented to person, place, and time.  Psychiatric:     Comments: Anxious appearing     ED Results / Procedures / Treatments   Labs (all labs ordered are listed, but only abnormal results are displayed) Labs Reviewed  BASIC METABOLIC PANEL - Abnormal; Notable for the following components:      Result Value   Potassium 3.4 (*)    Chloride 90 (*)    Glucose, Bld 134 (*)    BUN <5 (*)     Calcium 8.8 (*)    Anion gap 17 (*)    All other components within normal limits  CBC - Abnormal; Notable for the following components:   RBC 3.61 (*)    Hemoglobin 11.3 (*)    HCT 31.6 (*)    RDW 24.5 (*)    Platelets 61 (*)    All other components within normal limits  COMPREHENSIVE METABOLIC PANEL - Abnormal; Notable for the following components:   Potassium 3.0 (*)    Chloride 90 (*)    Glucose, Bld 132 (*)    BUN <5 (*)    Creatinine, Ser 0.60 (*)    Calcium 8.7 (*)    Albumin 3.4 (*)    AST 126 (*)    Total Bilirubin 5.7 (*)    Anion gap 24 (*)    All other components within normal limits  PROTIME-INR - Abnormal; Notable for the following components:   Prothrombin Time 17.3 (*)    INR 1.4 (*)    All other components within normal limits  ETHANOL - Abnormal; Notable for the following components:   Alcohol, Ethyl (B) 175 (*)    All other components within normal limits  LIPASE, BLOOD  TYPE AND SCREEN  TROPONIN I (HIGH SENSITIVITY)  TROPONIN I (HIGH SENSITIVITY)    EKG EKG Interpretation Date/Time:  Tuesday January 27 2024 01:52:12 EST Ventricular Rate:  121 PR Interval:  164 QRS Duration:  84 QT Interval:  322 QTC Calculation: 457 R Axis:   40  Text Interpretation: Sinus tachycardia Biatrial enlargement Abnormal ECG When compared with ECG of 13-Feb-2023 02:05, PREVIOUS ECG IS PRESENT Confirmed by Ross Marcus ([id number]) on 01/27/2024 3:47:42 AM  Radiology DG Chest 2 View Result Date: 01/27/2024 CLINICAL DATA:  Chest pain, tachycardia, hematemesis EXAM: CHEST - 2 VIEW COMPARISON:  10/21/2022 FINDINGS: Lungs are clear.  No pleural effusion or pneumothorax. The heart is normal in size. Visualized osseous structures are within normal limits. IMPRESSION: Normal chest radiographs. Electronically Signed   By: Charline Bills M.D.   On: 01/27/2024 02:19    Procedures .Critical Care  Performed by: Shon Baton, MD Authorized by: Shon Baton, MD    Critical care provider statement:    Critical care time (minutes):  40   Critical care was necessary to treat or prevent imminent or life-threatening deterioration of the following conditions: Hematemesis, alcoholic cirrhosis, hypokalemia.   Critical care was time spent personally by me on the following activities:  Development of treatment plan with patient or surrogate, discussions with consultants, evaluation of patient's response to treatment, examination of patient, ordering and review of laboratory studies,  ordering and review of radiographic studies, ordering and performing treatments and interventions, pulse oximetry, re-evaluation of patient's condition and review of old charts     Medications Ordered in ED Medications  pantoprozole (PROTONIX) 80 mg /NS 100 mL infusion (8 mg/hr Intravenous New Bag/Given 01/27/24 0416)  octreotide (SANDOSTATIN) 500 mcg in sodium chloride 0.9 % 250 mL (2 mcg/mL) infusion (50 mcg/hr Intravenous New Bag/Given 01/27/24 0345)  potassium chloride 10 mEq in 100 mL IVPB (10 mEq Intravenous New Bag/Given 01/27/24 0505)  LORazepam (ATIVAN) injection 0-4 mg (has no administration in time range)    Or  LORazepam (ATIVAN) tablet 0-4 mg (has no administration in time range)  LORazepam (ATIVAN) injection 0-4 mg (has no administration in time range)    Or  LORazepam (ATIVAN) tablet 0-4 mg (has no administration in time range)  thiamine (VITAMIN B1) tablet 100 mg (has no administration in time range)    Or  thiamine (VITAMIN B1) injection 100 mg (has no administration in time range)  cefTRIAXone (ROCEPHIN) 1 g in sodium chloride 0.9 % 100 mL IVPB (has no administration in time range)  morphine (PF) 4 MG/ML injection 4 mg (4 mg Intravenous Given 01/27/24 0301)  ondansetron (ZOFRAN) injection 4 mg (4 mg Intravenous Given 01/27/24 0301)  pantoprazole (PROTONIX) 80 mg /NS 100 mL IVPB (0 mg Intravenous Stopped 01/27/24 0402)  sodium chloride 0.9 % bolus 1,000 mL (1,000 mLs  Intravenous New Bag/Given 01/27/24 0441)  iohexol (OMNIPAQUE) 350 MG/ML injection 75 mL (75 mLs Intravenous Contrast Given 01/27/24 0405)    ED Course/ Medical Decision Making/ A&P Clinical Course as of 01/27/24 0507  Tue Jan 27, 2024  0502 I had a long conversation with the patient and his wife regarding my suspicions that he is continuing to drink more than he is admitting.  Apparently he used to drink up to 1/5 of liquor a day and up to a 12 pack of beer.  Reports that he is only drinking 3-4 beers a couple times a week.  EtOH today 175.  Highly suspect that he is drinking more than he admits to.  I discussed with him and his wife that he has signs of cirrhosis at this point and I suspect that his bloody emesis is related to ongoing gastritis versus peptic ulcer versus varices.  Will plan for admission.  Patient placed on CIWA protocol. [CH]    Clinical Course User Index [CH] Korayma Hagwood, Mayer Masker, MD                                 Medical Decision Making Amount and/or Complexity of Data Reviewed Labs: ordered. Radiology: ordered.  Risk OTC drugs. Prescription drug management. Decision regarding hospitalization.   This patient presents to the ED for concern of hematemesis, this involves an extensive number of treatment options, and is a complaint that carries with it a high risk of complications and morbidity.  I considered the following differential and admission for this acute, potentially life threatening condition.  The differential diagnosis includes gastritis, ulcer, varices  MDM:    This is a 38 year old male who presents with reported hematemesis.  He is tachycardic and ill-appearing but nontoxic.  He has some scleral icterus.  He has some mild epigastric tenderness.  Given his history and review of his prior EGD, workup was initiated.  Patient was given IV Protonix, IV octreotide, IV Rocephin.  I have high suspicion that he is likely  still drinking and drinking more than he is  willing to share.  LFTs are in a pattern for alcohol use including elevated AST.  Bilirubin is 5.7 and INR is elevated.  Suspect he has early cirrhosis.  Hypokalemia with potassium of 3.0.  This was replaced.  Blood alcohol level is 175.  Patient initially endorsed last use yesterday.  Patient was placed on CIWA protocol.  High risk of withdrawal.  Had long discussion with the patient and his wife regarding my concerns that his alcohol use is causing his presentation today.  Will admit for repeat EGD and ongoing management.  (Labs, imaging, consults)  Labs: I Ordered, and personally interpreted labs.  The pertinent results include: CBC, CMP, lipase, PT/INR, EtOH  Imaging Studies ordered: I ordered imaging studies including CT abdomen pelvis I independently visualized and interpreted imaging. I agree with the radiologist interpretation  Additional history obtained from wife at bedside.  External records from outside source obtained and reviewed including prior admissions and EGD  Cardiac Monitoring: The patient was maintained on a cardiac monitor.  If on the cardiac monitor, I personally viewed and interpreted the cardiac monitored which showed an underlying rhythm of: Tachycardia  Reevaluation: After the interventions noted above, I reevaluated the patient and found that they have :stayed the same  Social Determinants of Health:  alcohol abuse history  Disposition: Admit  Co morbidities that complicate the patient evaluation  Past Medical History:  Diagnosis Date   Alcohol abuse    Asthma    Back pain    Bulging lumbar disc    COVID    01/2021   Encounter for lumbar puncture    Hematochezia    Hypokalemia    Hyponatremia    Lower GI bleed    Neuropathy      Medicines Meds ordered this encounter  Medications   DISCONTD: pantoprazole (PROTONIX) 80 mg /NS 100 mL IVPB   pantoprozole (PROTONIX) 80 mg /NS 100 mL infusion   DISCONTD: pantoprazole (PROTONIX) injection 40 mg    morphine (PF) 4 MG/ML injection 4 mg    Refill:  0   ondansetron (ZOFRAN) injection 4 mg   octreotide (SANDOSTATIN) 500 mcg in sodium chloride 0.9 % 250 mL (2 mcg/mL) infusion   DISCONTD: pantoprazole (PROTONIX) 80 mg /NS 100 mL IVPB   pantoprazole (PROTONIX) 80 mg /NS 100 mL IVPB   sodium chloride 0.9 % bolus 1,000 mL   iohexol (OMNIPAQUE) 350 MG/ML injection 75 mL   potassium chloride 10 mEq in 100 mL IVPB   OR Linked Order Group    LORazepam (ATIVAN) injection 0-4 mg     CIWA-AR < 5 =:   0 mg     CIWA-AR 5 -10 =:   1 mg     CIWA-AR 11 -15 =:   2 mg     CIWA-AR 16 -20 =:   3 mg     CIWA-AR 16 -20 =:   Recheck CIWA-AR in 1 hour; if > 20 notify MD     CIWA-AR > 20 =:   4 mg     CIWA-AR > 20 =:   Notify MD    LORazepam (ATIVAN) tablet 0-4 mg     CIWA-AR < 5 =:   0 mg     CIWA-AR 5 -10 =:   1 mg     CIWA-AR 11 -15 =:   2 mg     CIWA-AR 16 -20 =:   3 mg  CIWA-AR 16 -20 =:   Recheck CIWA-AR in 1 hour; if > 20 notify MD     CIWA-AR > 20 =:   4 mg     CIWA-AR > 20 =:   Notify MD   OR Linked Order Group    LORazepam (ATIVAN) injection 0-4 mg     CIWA-AR < 5 =:   0 mg     CIWA-AR 5 -10 =:   1 mg     CIWA-AR 11 -15 =:   2 mg     CIWA-AR 16 -20 =:   3 mg     CIWA-AR 16 -20 =:   Recheck CIWA-AR in 1 hour; if > 20 notify MD     CIWA-AR > 20 =:   4 mg     CIWA-AR > 20 =:   Notify MD    LORazepam (ATIVAN) tablet 0-4 mg     CIWA-AR < 5 =:   0 mg     CIWA-AR 5 -10 =:   1 mg     CIWA-AR 11 -15 =:   2 mg     CIWA-AR 16 -20 =:   3 mg     CIWA-AR 16 -20 =:   Recheck CIWA-AR in 1 hour; if > 20 notify MD     CIWA-AR > 20 =:   4 mg     CIWA-AR > 20 =:   Notify MD   OR Linked Order Group    thiamine (VITAMIN B1) tablet 100 mg    thiamine (VITAMIN B1) injection 100 mg   cefTRIAXone (ROCEPHIN) 1 g in sodium chloride 0.9 % 100 mL IVPB    Antibiotic Indication::   Intra-abdominal    I have reviewed the patients home medicines and have made adjustments as needed  Problem List / ED  Course: Problem List Items Addressed This Visit       Digestive   Hematemesis - Primary     Other   Hypokalemia   Other Visit Diagnoses       Alcoholic cirrhosis, unspecified whether ascites present (HCC)         Alcohol dependence with unspecified alcohol-induced disorder (HCC)                       Final Clinical Impression(s) / ED Diagnoses Final diagnoses:  Hematemesis, unspecified whether nausea present  Alcoholic cirrhosis, unspecified whether ascites present (HCC)  Alcohol dependence with unspecified alcohol-induced disorder (HCC)  Hypokalemia    Rx / DC Orders ED Discharge Orders     None         Shawntavia Saunders, Mayer Masker, MD 01/27/24 [phone]

## 2024-01-27 NOTE — ED Notes (Signed)
 Placed call to [facility] secretary to let them know Pt was on their way upstairs.

## 2024-01-28 DIAGNOSIS — K766 Portal hypertension: Secondary | ICD-10-CM | POA: Diagnosis not present

## 2024-01-28 DIAGNOSIS — F1029 Alcohol dependence with unspecified alcohol-induced disorder: Secondary | ICD-10-CM | POA: Diagnosis not present

## 2024-01-28 DIAGNOSIS — D696 Thrombocytopenia, unspecified: Secondary | ICD-10-CM | POA: Diagnosis not present

## 2024-01-28 DIAGNOSIS — K769 Liver disease, unspecified: Secondary | ICD-10-CM

## 2024-01-28 DIAGNOSIS — K7030 Alcoholic cirrhosis of liver without ascites: Secondary | ICD-10-CM | POA: Diagnosis not present

## 2024-01-28 DIAGNOSIS — E876 Hypokalemia: Secondary | ICD-10-CM | POA: Diagnosis not present

## 2024-01-28 DIAGNOSIS — K920 Hematemesis: Secondary | ICD-10-CM | POA: Diagnosis not present

## 2024-01-28 DIAGNOSIS — I8510 Secondary esophageal varices without bleeding: Secondary | ICD-10-CM | POA: Diagnosis not present

## 2024-01-28 LAB — CBC
HCT: 29.5 % — ABNORMAL LOW (ref 39.0–52.0)
Hemoglobin: 10.5 g/dL — ABNORMAL LOW (ref 13.0–17.0)
MCH: 31.6 pg (ref 26.0–34.0)
MCHC: 35.6 g/dL (ref 30.0–36.0)
MCV: 88.9 fL (ref 80.0–100.0)
Platelets: 52 10*3/uL — ABNORMAL LOW (ref 150–400)
RBC: 3.32 MIL/uL — ABNORMAL LOW (ref 4.22–5.81)
RDW: 24.4 % — ABNORMAL HIGH (ref 11.5–15.5)
WBC: 5.1 10*3/uL (ref 4.0–10.5)
nRBC: 0 % (ref 0.0–0.2)

## 2024-01-28 LAB — HEMOGLOBIN A1C
Hgb A1c MFr Bld: 4.9 % (ref 4.8–5.6)
Mean Plasma Glucose: 93.93 mg/dL

## 2024-01-28 LAB — COMPREHENSIVE METABOLIC PANEL
ALT: 21 U/L (ref 0–44)
AST: 78 U/L — ABNORMAL HIGH (ref 15–41)
Albumin: 3 g/dL — ABNORMAL LOW (ref 3.5–5.0)
Alkaline Phosphatase: 87 U/L (ref 38–126)
Anion gap: 10 (ref 5–15)
BUN: <5 mg/dL — ABNORMAL LOW (ref 6–20)
CO2: 28 mmol/L (ref 22–32)
Calcium: 7.4 mg/dL — ABNORMAL LOW (ref 8.9–10.3)
Chloride: 98 mmol/L (ref 98–111)
Creatinine, Ser: 0.61 mg/dL (ref 0.61–1.24)
GFR, Estimated: >60 mL/min (ref >60)
Glucose, Bld: 119 mg/dL — ABNORMAL HIGH (ref 70–99)
Potassium: 3.4 mmol/L — ABNORMAL LOW (ref 3.5–5.1)
Sodium: 136 mmol/L (ref 135–145)
Total Bilirubin: 6.5 mg/dL — ABNORMAL HIGH (ref 0.0–1.2)
Total Protein: 6.4 g/dL — ABNORMAL LOW (ref 6.5–8.1)

## 2024-01-28 LAB — SURGICAL PATHOLOGY

## 2024-01-28 LAB — GLUCOSE, CAPILLARY
Glucose-Capillary: 189 mg/dL — ABNORMAL HIGH (ref 70–99)
Glucose-Capillary: 99 mg/dL (ref 70–99)
Glucose-Capillary: 92 mg/dL (ref 70–99)

## 2024-01-28 LAB — MAGNESIUM: Magnesium: 1 mg/dL — ABNORMAL LOW (ref 1.7–2.4)

## 2024-01-28 LAB — AFP TUMOR MARKER: AFP, Serum, Tumor Marker: 4.8 ng/mL (ref 0.0–6.9)

## 2024-01-28 MED ORDER — POTASSIUM CHLORIDE CRYS ER 20 MEQ PO TBCR
40.0000 meq | EXTENDED_RELEASE_TABLET | ORAL | Status: AC
  Administered 2024-01-28 (×2): 40 meq via ORAL
  Filled 2024-01-28 (×2): qty 2

## 2024-01-28 MED ORDER — INSULIN ASPART 100 UNIT/ML IJ SOLN: 0.0000 [IU] | Freq: Three times a day (TID) | INTRAMUSCULAR | Status: DC

## 2024-01-28 MED ORDER — MAGNESIUM SULFATE 4 GM/100ML IV SOLN
4.0000 g | Freq: Once | INTRAVENOUS | Status: AC
  Administered 2024-01-28: 4 g via INTRAVENOUS
  Filled 2024-01-28: qty 100

## 2024-01-28 NOTE — Progress Notes (Signed)
 PROGRESS NOTE        PATIENT DETAILS Name: Shane Holmes Age: 38 y.o. Sex: male Date of Birth: 1986/06/24 Admit Date: 01/27/2024 Admitting Physician Briscoe Deutscher, MD PCP:Sun, Charyl Dancer, MD  Brief Summary: Patient is a 38 y.o.  male with a EtOH use, Barrett's esophagus, DM-2-who presented with chest pain/hematemesis.  Significant events: 2/25>> admit to [facility]-upper GI bleed-GI consult.  Significant studies: 2/25>> CT abdomen/pelvis: Mild peripancreatic stranding-cirrhosis with portal hypertension. 2/25>> MRI liver: Cirrhosis/hepatomegaly  Significant microbiology data: None  Procedures: 2/25>> EGD: Grade 2 varices, portal hypertensive gastropathy-Barrett's esophagus.  Consults: Gastroenterology  Subjective: Lying comfortably in bed-denies any chest pain or shortness of breath.  Had brown stools yesterday.  Objective: Vitals: Blood pressure 112/86, pulse 76, temperature 98.3 F (36.8 C), temperature source Oral, resp. rate 15, height 5\' 11"  (1.803 m), weight 93 kg, SpO2 95%.   Exam: Gen Exam:Alert awake-not in any distress HEENT:atraumatic, normocephalic Chest: B/L clear to auscultation anteriorly CVS:S1S2 regular Abdomen:soft non tender, non distended Extremities:no edema Neurology: Non focal Skin: no rash  Pertinent Labs/Radiology:    Latest Ref Rng & Units 01/28/2024    5:10 AM 01/27/2024    8:47 PM 01/27/2024    6:09 PM  CBC  WBC 4.0 - 10.5 K/uL 5.1     Hemoglobin 13.0 - 17.0 g/dL 40.9  81.1  91.4   Hematocrit 39.0 - 52.0 % 29.5  28.6  29.6   Platelets 150 - 400 K/uL 52       Lab Results  Component Value Date   NA 136 01/28/2024   K 3.4 (L) 01/28/2024   CL 98 01/28/2024   CO2 28 01/28/2024      Assessment/Plan: Upper GI bleeding with acute blood loss anemia GI bleed seems to have resolved-Hb stable-brown stools last night EGD 2/25-no active bleeding Advance diet Will complete octreotide infusion today Nadolol PPI twice  daily If remains stable-plans to discharge home tomorrow morning.  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  Code status:   Code Status: Full Code   DVT Prophylaxis: SCDs Start: 01/27/24 0904    Family Communication: None at bedside   Disposition Plan: Status is: Inpatient Remains inpatient appropriate because: Severity of illness   Planned Discharge Destination:Home   Diet: Diet Order             Diet regular Room service appropriate? Yes; Fluid consistency: Thin  Diet effective now                     Antimicrobial agents: Anti-infectives (From admission, onward)    Start     Dose/Rate Route Frequency Ordered Stop   01/28/24 0600  cefTRIAXone (ROCEPHIN) 2 g in sodium chloride 0.9 % 100 mL IVPB        2 g 200 mL/hr over 30 Minutes Intravenous Every 24 hours 01/27/24 0906     01/27/24 0515  cefTRIAXone (ROCEPHIN) 1 g in sodium chloride 0.9 % 100 mL IVPB        1 g 200 mL/hr over 30 Minutes Intravenous  Once 01/27/24 0505 01/27/24 0616        MEDICATIONS: Scheduled Meds:  baclofen  10 mg Oral BID   folic acid  1 mg Oral Daily   gabapentin  800 mg Oral TID   LORazepam  0-4 mg Intravenous Q6H   Or   LORazepam  0-4 mg Oral Q6H   [START ON 01/29/2024] LORazepam  0-4 mg Intravenous Q12H   Or   [START ON 01/29/2024] LORazepam  0-4 mg Oral Q12H   multivitamin with minerals  1 tablet Oral Daily   nadolol  20 mg Oral Daily   nicotine  21 mg Transdermal Daily   pantoprazole (PROTONIX) IV  40 mg Intravenous Q12H   potassium chloride  40 mEq Oral Q4H    sodium chloride flush  3 mL Intravenous Q12H   thiamine  100 mg Oral Daily   Or   thiamine  100 mg Intravenous Daily   Continuous Infusions:  cefTRIAXone (ROCEPHIN)  IV 2 g (01/28/24 0525)   PRN Meds:.HYDROcodone-acetaminophen, LORazepam, ondansetron (ZOFRAN) IV, prochlorperazine   I have personally reviewed following labs and imaging studies  LABORATORY DATA: CBC: Recent Labs  Lab 01/27/24 0202 01/27/24 1809 01/27/24 2047 01/28/24 0510  WBC 6.5  --   --  5.1  HGB 11.3* 10.5* 10.1* 10.5*  HCT 31.6* 29.6* 28.6* 29.5*  MCV 87.5  --   --  88.9  PLT 61*  --   --  52*    Basic Metabolic Panel: Recent Labs  Lab 01/27/24 0202 01/27/24 0258 01/28/24 0510  NA 137 136 136  K 3.4* 3.0* 3.4*  CL 90* 90* 98  CO2 30 22 28   GLUCOSE 134* 132* 119*  BUN <5* <5* <5*  CREATININE 0.66 0.60* 0.61  CALCIUM 8.8* 8.7* 7.4*  MG  --   --  1.0*    GFR: Estimated Creatinine Clearance: 145.9 mL/min (by C-G formula based on SCr of 0.61 mg/dL).  Liver Function Tests: Recent Labs  Lab 01/27/24 0258 01/28/24 0510  AST 126* 78*  ALT 27 21  ALKPHOS 105 87  BILITOT 5.7* 6.5*  PROT 7.2 6.4*  ALBUMIN 3.4* 3.0*   Recent Labs  Lab 01/27/24 0411  LIPASE 40   No results for input(s): "AMMONIA" in the last 168 hours.  Coagulation Profile: Recent Labs  Lab 01/27/24 0258  INR 1.4*    Cardiac Enzymes: No results for input(s): "CKTOTAL", "CKMB", "CKMBINDEX", "TROPONINI" in the last 168 hours.  BNP (last 3 results) No results for input(s): "PROBNP" in the last 8760 hours.  Lipid Profile: No results for input(s): "CHOL", "HDL", "LDLCALC", "TRIG", "CHOLHDL", "LDLDIRECT" in the last 72 hours.  Thyroid Function Tests: No results for input(s): "TSH", "T4TOTAL", "FREET4", "T3FREE", "THYROIDAB" in the last 72 hours.  Anemia Panel: No results for input(s): "VITAMINB12", "FOLATE", "FERRITIN", "TIBC", "IRON", "RETICCTPCT" in the last 72 hours.  Urine analysis:    Component Value  Date/Time   COLORURINE YELLOW 10/21/2022 1735   APPEARANCEUR CLEAR 10/21/2022 1735   LABSPEC 1.025 10/21/2022 1735   PHURINE 9.0 (H) 10/21/2022 1735   GLUCOSEU >=500 (A) 10/21/2022 1735   HGBUR NEGATIVE 10/21/2022 1735   BILIRUBINUR NEGATIVE 10/21/2022 1735   KETONESUR 20 (A) 10/21/2022 1735   PROTEINUR 30 (A) 10/21/2022 1735   NITRITE NEGATIVE 10/21/2022 1735   LEUKOCYTESUR NEGATIVE 10/21/2022 1735    Sepsis Labs: Lactic Acid, Venous No results found for: "LATICACIDVEN"  MICROBIOLOGY: No results found for this or any previous visit (from the past 240 hours).  RADIOLOGY STUDIES/RESULTS: MR LIVER W WO CONTRAST Result Date: 01/27/2024 CLINICAL DATA:  Abnormal CT, cirrhosis EXAM: MRI ABDOMEN WITHOUT AND WITH CONTRAST TECHNIQUE: Multiplanar multisequence MR imaging of the abdomen was performed both before and after the administration of intravenous contrast. CONTRAST:  9mL GADAVIST GADOBUTROL 1 MMOL/ML IV SOLN COMPARISON:  CT abdomen pelvis, 01/27/2024, 06/19/2022 FINDINGS: Lower chest: No acute abnormality. Small hiatal hernia containing the gastric fundus and fluid. Hepatobiliary: Hepatomegaly, maximum coronal span 25.0 cm. Profound hepatic steatosis. Coarse, nodular contour of the liver. Diffuse, heterogeneous nodularity throughout the liver, consistent with regenerative nodularity. No arterially hyperenhancing lesions. Sludge and gravel in the gallbladder. No gallbladder wall thickening, or biliary dilatation. Pancreas: Unremarkable. No pancreatic ductal dilatation or surrounding inflammatory changes. Spleen: Splenomegaly, maximum coronal span 17.9 cm. Adrenals/Urinary Tract: Adrenal glands are unremarkable. Kidneys are normal, without renal calculi, solid lesion, or hydronephrosis. Stomach/Bowel: Stomach is within normal limits. No evidence of bowel wall thickening, distention, or inflammatory changes. Vascular/Lymphatic: Aortic atherosclerosis. No enlarged abdominal lymph nodes. Other: No  abdominal wall hernia or abnormality. Trace perihepatic ascites. Musculoskeletal: No acute or significant osseous findings. IMPRESSION: 1. Cirrhosis and hepatomegaly. Profound hepatic steatosis. 2. Diffuse, heterogeneous nodularity throughout the liver, consistent with regenerative nodularity. No arterially hyperenhancing liver lesions. 3. Splenomegaly and trace perihepatic ascites, consistent with portal hypertension. 4. Sludge and gravel in the gallbladder. 5. Small hiatal hernia containing the gastric fundus and fluid. 6. Aortic atherosclerosis, advanced for patient age. Aortic Atherosclerosis (ICD10-I70.0). Electronically Signed   By: Jearld Lesch M.D.   On: 01/27/2024 21:22   CT ABDOMEN PELVIS W CONTRAST Result Date: 01/27/2024 CLINICAL DATA:  Acute, nonlocalized abdominal pain EXAM: CT ABDOMEN AND PELVIS WITH CONTRAST TECHNIQUE: Multidetector CT imaging of the abdomen and pelvis was performed using the standard protocol following bolus administration of intravenous contrast. RADIATION DOSE REDUCTION: This exam was performed according to the departmental dose-optimization program which includes automated exposure control, adjustment of the mA and/or kV according to patient size and/or use of iterative reconstruction technique. CONTRAST:  75mL OMNIPAQUE IOHEXOL 350 MG/ML SOLN COMPARISON:  12/18/2022 FINDINGS: Lower chest:  Small hiatal hernia containing fat and ascitic fluid Hepatobiliary: Heterogeneous and enlarged liver with surface lobulation. The periumbilical vein is recanalized. No acute finding.High-density layering in the gallbladder with at 1 calcification. No evidence of biliary inflammation. Pancreas: Subtle peripancreatic stranding. No necrosis or collection. Spleen: Chronic enlargement with 14.5 cm anterior to posterior span. Adrenals/Urinary Tract: Negative adrenals. No hydronephrosis or stone. 13 mm cyst at the upper pole right kidney. Unremarkable bladder. Stomach/Bowel:  No obstruction. No  visible bowel inflammation. Vascular/Lymphatic: No acute vascular abnormality, portal venous findings above. No mass or adenopathy. Reproductive:No pathologic findings. Other: No ascites or pneumoperitoneum. Musculoskeletal: No acute abnormalities.  Chronic L5 pars defects. IMPRESSION: 1. Mild peripancreatic stranding, correlate for pancreatitis by labs. 2. Cirrhosis with portal hypertension. Innumerable nodules in the liver may be regenerating nodules but need MRI follow-up. 3. Cholelithiasis.  Full gallbladder but no associated inflammation. Electronically Signed   By: Tiburcio Pea M.D.   On: 01/27/2024 05:05   DG Chest 2 View Result Date: 01/27/2024 CLINICAL DATA:  Chest pain, tachycardia, hematemesis EXAM: CHEST - 2 VIEW COMPARISON:  10/21/2022 FINDINGS: Lungs are clear.  No pleural effusion or pneumothorax. The heart is normal in size. Visualized osseous structures are within normal limits. IMPRESSION: Normal chest radiographs. Electronically Signed   By: Charline Bills M.D.   On: 01/27/2024 02:19     LOS: 1 day   [employer],  MD  [facility]    To contact the attending provider between 7A-7P or the covering provider during after hours 7P-7A, please log into the web site [email or web address] and access using universal [facility] bleeding haspassword for that web site. If you do not have the password, please call the hospital operator.  01/28/2024, 1:41 PM

## 2024-01-28 NOTE — Plan of Care (Signed)
  Problem: Education: Goal: Knowledge of General Education information will improve Description: Including pain rating scale, medication(s)/side effects and non-pharmacologic comfort measures Outcome: Progressing   Problem: Health Behavior/Discharge Planning: Goal: Ability to manage health-related needs will improve Outcome: Progressing   Problem: Clinical Measurements: Goal: Ability to maintain clinical measurements within normal limits will improve Outcome: Progressing Goal: Will remain free from infection Outcome: Progressing Goal: Diagnostic test results will improve Outcome: Progressing Goal: Respiratory complications will improve Outcome: Progressing Goal: Cardiovascular complication will be avoided Outcome: Progressing   Problem: Elimination: Goal: Will not experience complications related to bowel motility Outcome: Progressing   Problem: Pain Managment: Goal: General experience of comfort will improve and/or be controlled Outcome: Progressing   Problem: Safety: Goal: Ability to remain free from injury will improve Outcome: Progressing

## 2024-01-28 NOTE — Progress Notes (Addendum)
 Patient ID: Shane Holmes, male   DOB: November 14, 1986, 38 y.o.   MRN: [id number]    Progress Note   Subjective   Day # 2 CC; hematemesis  EGD yesterday-grade 2 esophageal varices large in size no stigmata of active or recent bleeding there were some areas of friable mucosa, some of these appear to extend into the upper gastric folds, small hiatal hernia, changes consistent with Barrett's esophagus moderate portal hypertensive gastropathy friability, there was no fresh or old blood seen  Biopsies of esophagus pending  MRI of liver done yesterday for abnormal liver on CT scan with notable nodules-no evidence of malignancy, nodules consistent with regenerative nodules AFP 4.8 WBC 5.1/hemoglobin 10.5/hematocrit 29.5/platelets 52 k 3.4/BUN less than 5/creatinine 0.61 TBili 6.5/AST 78/ALT 21  Patient says he feels fine today, he is hungry, no nausea, no dysphagia or odynophagia he says he had a brown appearing bowel movement today Hoping to go home  Octreotide had been discontinued       Objective   Vital signs in last 24 hours: Temp:  [98.1 F (36.7 C)-98.7 F (37.1 C)] 98.3 F (36.8 C) (02/26 0445) Pulse Rate:  [76-102] 76 (02/26 1200) Resp:  [14-18] 15 (02/26 1200) BP: (112-143)/(77-98) 112/86 (02/26 1200) SpO2:  [90 %-96 %] 95 % (02/26 1200) Last BM Date : 01/27/24 General:    White male n NAD mentating well, not tremulous Heart:  Regular rate and rhythm; no murmurs Lungs: Respirations even and unlabored, lungs CTA bilaterally Abdomen:  Soft, nontender and nondistended. Normal bowel sounds. Extremities:  Without edema. Neurologic:  Alert and oriented,  grossly normal neurologically. Psych:  Cooperative. Normal mood and affect.  Intake/Output from previous day: 02/25 0701 - 02/26 0700 In: 284.8 [I.V.:284.8] Out: 1000 [Urine:1000] Intake/Output this shift: No intake/output data recorded.  Lab Results: Recent Labs    01/27/24 0202 01/27/24 1809 01/27/24 2047  01/28/24 0510  WBC 6.5  --   --  5.1  HGB 11.3* 10.5* 10.1* 10.5*  HCT 31.6* 29.6* 28.6* 29.5*  PLT 61*  --   --  52*   BMET Recent Labs    01/27/24 0202 01/27/24 0258 01/28/24 0510  NA 137 136 136  K 3.4* 3.0* 3.4*  CL 90* 90* 98  CO2 30 22 28   GLUCOSE 134* 132* 119*  BUN <5* <5* <5*  CREATININE 0.66 0.60* 0.61  CALCIUM 8.8* 8.7* 7.4*   LFT Recent Labs    01/28/24 0510  PROT 6.4*  ALBUMIN 3.0*  AST 78*  ALT 21  ALKPHOS 87  BILITOT 6.5*   PT/INR Recent Labs    01/27/24 0258  LABPROT 17.3*  INR 1.4*    Studies/Results: MR LIVER W WO CONTRAST Result Date: 01/27/2024 CLINICAL DATA:  Abnormal CT, cirrhosis EXAM: MRI ABDOMEN WITHOUT AND WITH CONTRAST TECHNIQUE: Multiplanar multisequence MR imaging of the abdomen was performed both before and after the administration of intravenous contrast. CONTRAST:  9mL GADAVIST GADOBUTROL 1 MMOL/ML IV SOLN COMPARISON:  CT abdomen pelvis, 01/27/2024, 06/19/2022 FINDINGS: Lower chest: No acute abnormality. Small hiatal hernia containing the gastric fundus and fluid. Hepatobiliary: Hepatomegaly, maximum coronal span 25.0 cm. Profound hepatic steatosis. Coarse, nodular contour of the liver. Diffuse, heterogeneous nodularity throughout the liver, consistent with regenerative nodularity. No arterially hyperenhancing lesions. Sludge and gravel in the gallbladder. No gallbladder wall thickening, or biliary dilatation. Pancreas: Unremarkable. No pancreatic ductal dilatation or surrounding inflammatory changes. Spleen: Splenomegaly, maximum coronal span 17.9 cm. Adrenals/Urinary Tract: Adrenal glands are unremarkable. Kidneys are normal, without  renal calculi, solid lesion, or hydronephrosis. Stomach/Bowel: Stomach is within normal limits. No evidence of bowel wall thickening, distention, or inflammatory changes. Vascular/Lymphatic: Aortic atherosclerosis. No enlarged abdominal lymph nodes. Other: No abdominal wall hernia or abnormality. Trace  perihepatic ascites. Musculoskeletal: No acute or significant osseous findings. IMPRESSION: 1. Cirrhosis and hepatomegaly. Profound hepatic steatosis. 2. Diffuse, heterogeneous nodularity throughout the liver, consistent with regenerative nodularity. No arterially hyperenhancing liver lesions. 3. Splenomegaly and trace perihepatic ascites, consistent with portal hypertension. 4. Sludge and gravel in the gallbladder. 5. Small hiatal hernia containing the gastric fundus and fluid. 6. Aortic atherosclerosis, advanced for patient age. Aortic Atherosclerosis (ICD10-I70.0). Electronically Signed   By: Jearld Lesch M.D.   On: 01/27/2024 21:22   CT ABDOMEN PELVIS W CONTRAST Result Date: 01/27/2024 CLINICAL DATA:  Acute, nonlocalized abdominal pain EXAM: CT ABDOMEN AND PELVIS WITH CONTRAST TECHNIQUE: Multidetector CT imaging of the abdomen and pelvis was performed using the standard protocol following bolus administration of intravenous contrast. RADIATION DOSE REDUCTION: This exam was performed according to the departmental dose-optimization program which includes automated exposure control, adjustment of the mA and/or kV according to patient size and/or use of iterative reconstruction technique. CONTRAST:  75mL OMNIPAQUE IOHEXOL 350 MG/ML SOLN COMPARISON:  12/18/2022 FINDINGS: Lower chest:  Small hiatal hernia containing fat and ascitic fluid Hepatobiliary: Heterogeneous and enlarged liver with surface lobulation. The periumbilical vein is recanalized. No acute finding.High-density layering in the gallbladder with at 1 calcification. No evidence of biliary inflammation. Pancreas: Subtle peripancreatic stranding. No necrosis or collection. Spleen: Chronic enlargement with 14.5 cm anterior to posterior span. Adrenals/Urinary Tract: Negative adrenals. No hydronephrosis or stone. 13 mm cyst at the upper pole right kidney. Unremarkable bladder. Stomach/Bowel:  No obstruction. No visible bowel inflammation.  Vascular/Lymphatic: No acute vascular abnormality, portal venous findings above. No mass or adenopathy. Reproductive:No pathologic findings. Other: No ascites or pneumoperitoneum. Musculoskeletal: No acute abnormalities.  Chronic L5 pars defects. IMPRESSION: 1. Mild peripancreatic stranding, correlate for pancreatitis by labs. 2. Cirrhosis with portal hypertension. Innumerable nodules in the liver may be regenerating nodules but need MRI follow-up. 3. Cholelithiasis.  Full gallbladder but no associated inflammation. Electronically Signed   By: Tiburcio Pea M.D.   On: 01/27/2024 05:05   DG Chest 2 View Result Date: 01/27/2024 CLINICAL DATA:  Chest pain, tachycardia, hematemesis EXAM: CHEST - 2 VIEW COMPARISON:  10/21/2022 FINDINGS: Lungs are clear.  No pleural effusion or pneumothorax. The heart is normal in size. Visualized osseous structures are within normal limits. IMPRESSION: Normal chest radiographs. Electronically Signed   By: Charline Bills M.D.   On: 01/27/2024 02:19       Assessment / Plan:    #52 38 year old white male with chronic EtOH use disorder presenting with hematemesis. Patient had been seen with a GI bleed in July 2023, at that time with grade 1 esophageal varices, portal gastropathy, probable long segment of Barrett's and a nonbleeding esophageal ulcer and several nonbleeding duodenal ulcers. He had maintained twice daily PPI, denies any regular aspirin or NSAID use. Has been using alcohol but vague as to amount  EGD yesterday with grade 2 esophageal varices some of which appeared to be extending into the upper stomach, some friability but no stigmata of active or recent bleeding, moderate portal hypertensive gastropathy with friability-he also had esophageal changes consistent with long segment Barrett's esophagus 3 cm hiatal hernia and biopsies done and pending  Bleeding fortunately was self-limited probably secondary to portal hypertensive gastropathy Further active  bleeding and hemoglobin relatively  stable has not required transfusions  CT this admission confirms cirrhosis, and significant nodularity MRI with no evidence for malignancy, felt to have regenerative nodules  M ELD 3.0 equal 18  Plan; regular diet today Continue twice daily PPI Completing octreotide infusion today Continue to trend hemoglobin-if all remains stable okay for discharge to home tomorrow Add nonselective beta-blocker  We will arrange for outpatient follow-up with Dr. Myrtie Neither Discussed again the absolute imperative nature of discontinuing all EtOH use moving forward.  He was also given precautions for recurrent bleeding and advised to return to the emergency room at any point in the future should he have recurrent hematemesis or melena      Principal Problem:   Hematemesis Active Problems:   Hypokalemia   Tobacco abuse   Alcohol abuse   Acute blood loss anemia   Alcoholic cirrhosis (HCC)   Secondary esophageal varices without bleeding (HCC)   Liver nodule   Thrombocytopenia (HCC)   GERD (gastroesophageal reflux disease)   Elevated liver function tests     LOS: 1 day   Amy Esterwood PA-C 01/28/2024, 2:10 PM      I have taken an interval history, thoroughly reviewed the chart and examined the patient. I agree with the Advanced Practitioner's note, impression and recommendations, and have recorded additional findings, impressions and recommendations below. I performed a substantive portion of this encounter (>50% time spent), including a complete performance of the medical decision making.  My additional thoughts are as follows:  Liver condition stable since admission No recurrence of upper GI bleeding, remains on octreotide and has been started on nadolol MRI shows regenerative nodules Probable discharge tomorrow, and we will arrange outpatient clinic follow-up with me.  He seems intent on alcohol cessation, and I certainly hope that will be the  case.   Charlie Pitter III Office:[phone]

## 2024-01-28 NOTE — TOC Progression Note (Cosign Needed)
 Transition of Care [facility]) - Progression Note    Patient Details  Name: GATLYN LIPARI MRN: [id number] Date of Birth: [birth year]  Transition of Care [facility] CM/SW Contact  Shamarra Warda Reeves Forth, Student-Social Work Phone Number: 01/28/2024, 12:28 PM  Clinical Narrative:     MSW Intern met with pt regarding SA consult. Pt stated he only uses his prescribed medications as directed and was not interested in resources.       Expected Discharge Plan and Services       Living arrangements for the past 2 months: Single Family Home                                       Social Determinants of Health (SDOH) Interventions SDOH Screenings   Food Insecurity: No Food Insecurity (01/27/2024)  Housing: Low Risk  (01/27/2024)  Transportation Needs: No Transportation Needs (01/27/2024)  Utilities: Not At Risk (01/27/2024)  Alcohol Screen: Medium Risk (02/01/2021)  Financial Resource Strain: Low Risk  (02/01/2021)  Social Connections: Unknown (04/16/2022)   Received from [facility]  Tobacco Use: High Risk (01/27/2024)    Readmission Risk Interventions    06/20/2022   11:48 AM  Readmission Risk Prevention Plan  Transportation Screening Complete  PCP or Specialist Appt within 3-5 Days Complete  HRI or Home Care Consult Complete  Social Work Consult for Recovery Care Planning/Counseling Complete  Palliative Care Screening Not Applicable  Medication Review [employer]ographer) Complete

## 2024-01-28 NOTE — TOC Initial Note (Cosign Needed)
 Transition of Care [facility] [place]) - Initial/Assessment Note    Patient Details  Name: Shane Holmes MRN: [id number] Date of Birth: 02-Aug-1986  Transition of Care [facility]) CM/SW Contact:    Lamonte Sakai, Student-Social Work Phone Number: 01/28/2024, 9:45 AM  Clinical Narrative:                 Pt admitted from home with significant other. TOC following for SA consult.       Patient Goals and CMS Choice            Expected Discharge Plan and Services       Living arrangements for the past 2 months: Single Family Home                                      Prior Living Arrangements/Services Living arrangements for the past 2 months: Single Family Home Lives with:: Significant Other                   Activities of Daily Living   ADL Screening (condition at time of admission) Independently performs ADLs?: Yes (appropriate for developmental age) Is the patient deaf or have difficulty hearing?: No Does the patient have difficulty seeing, even when wearing glasses/contacts?: No Does the patient have difficulty concentrating, remembering, or making decisions?: No  Permission Sought/Granted                  Emotional Assessment       Orientation: : Oriented to Self, Oriented to Place, Oriented to  Time, Oriented to Situation Alcohol / Substance Use: Tobacco Use, Alcohol Use Psych Involvement: No (comment)  Admission diagnosis:  Hematemesis [K92.0] Hypokalemia [E87.6] Alcohol dependence with unspecified alcohol-induced disorder (HCC) [F10.29] Alcoholic cirrhosis, unspecified whether ascites present (HCC) [K70.30] Hematemesis, unspecified whether nausea present [K92.0] Patient Active Problem List   Diagnosis Date Noted   Alcoholic cirrhosis (HCC) 01/27/2024   Secondary esophageal varices without bleeding (HCC) 01/27/2024   Liver nodule 01/27/2024   Thrombocytopenia (HCC) 01/27/2024   GERD (gastroesophageal reflux disease) 01/27/2024   Elevated liver  function tests 01/27/2024   Uncontrolled type 2 diabetes mellitus with hyperglycemia, without long-term current use of insulin (HCC) 10/22/2022   Hematemesis 10/21/2022   Acute upper GI bleed 06/19/2022   Epigastric pain 06/19/2022   Hyperglycemia 06/19/2022   Leukocytosis 06/19/2022   Chronic anemia 06/19/2022   Neuropathy    Hematemesis with nausea    Transaminitis    History of COVID-19 01/29/2021   Weakness of both lower extremities    Hereditary and idiopathic peripheral neuropathy    Dyschezia    Anal fissure    Melena    Alcohol abuse    Acute blood loss anemia    Lower GI bleed 01/22/2021   Alcohol withdrawal syndrome with complication, with unspecified complication (HCC) 01/22/2021   COVID-19 virus infection 01/22/2021   Acute asthma exacerbation 01/22/2021   Hypokalemia 01/22/2021   Tobacco abuse 01/22/2021   Hyponatremia 01/22/2021   Dehydration 01/22/2021   PCP:  Salli Real, MD Pharmacy:   [phone] - [place], [place] - [address] [place] [address] Phone: [phone] Fax: [phone]  [id number] - [place], [place] - [address] AT [address] [facility] [address] [place] [address] Phone: [phone] Fax: [phone]     Social Drivers of Health (SDOH) Social History: SDOH  Screenings   Food Insecurity: No Food Insecurity (01/27/2024)  Housing: Low Risk  (01/27/2024)  Transportation Needs: No Transportation Needs (01/27/2024)  Utilities: Not At Risk (01/27/2024)  Alcohol Screen: Medium Risk (02/01/2021)  Financial Resource Strain: Low Risk  (02/01/2021)  Social Connections: Unknown (04/16/2022)   Received from [facility]  Tobacco Use: High Risk (01/27/2024)   SDOH Interventions:     Readmission Risk Interventions    06/20/2022   11:48 AM  Readmission Risk Prevention Plan  Transportation Screening Complete  PCP or Specialist Appt within 3-5 Days Complete   HRI or Home Care Consult Complete  Social Work Consult for Recovery Care Planning/Counseling Complete  Palliative Care Screening Not Applicable  Medication Review Oceanographer) Complete

## 2024-01-28 NOTE — TOC Transition Note (Deleted)
 Transition of Care [facility]) - Discharge Note   Patient Details  Name: Shane Holmes MRN: [id number] Date of Birth: 01/06/1986  Transition of Care [facility] CM/SW Contact:  Gordy Clement, RN Phone Number: 01/28/2024, 1:42 PM   Clinical Narrative:     Patient will DC to home today No TOC needs have been identified Friend will transport  Patient will follow up as directed on the AVS          Patient Goals and CMS Choice            Discharge Placement                       Discharge Plan and Services Additional resources added to the After Visit Summary for                                       Social Drivers of Health (SDOH) Interventions SDOH Screenings   Food Insecurity: No Food Insecurity (01/27/2024)  Housing: Low Risk  (01/27/2024)  Transportation Needs: No Transportation Needs (01/27/2024)  Utilities: Not At Risk (01/27/2024)  Alcohol Screen: Medium Risk (02/01/2021)  Financial Resource Strain: Low Risk  (02/01/2021)  Social Connections: Unknown (04/16/2022)   Received from [facility]  Tobacco Use: High Risk (01/27/2024)     Readmission Risk Interventions    06/20/2022   11:48 AM  Readmission Risk Prevention Plan  Transportation Screening Complete  PCP or Specialist Appt within 3-5 Days Complete  HRI or Home Care Consult Complete  Social Work Consult for Recovery Care Planning/Counseling Complete  Palliative Care Screening Not Applicable  Medication Review Oceanographer) Complete

## 2024-01-28 NOTE — Plan of Care (Signed)

## 2024-01-29 ENCOUNTER — Other Ambulatory Visit (HOSPITAL_COMMUNITY): Payer: Self-pay

## 2024-01-29 DIAGNOSIS — F1029 Alcohol dependence with unspecified alcohol-induced disorder: Secondary | ICD-10-CM | POA: Diagnosis not present

## 2024-01-29 DIAGNOSIS — K920 Hematemesis: Secondary | ICD-10-CM | POA: Diagnosis not present

## 2024-01-29 DIAGNOSIS — D62 Acute posthemorrhagic anemia: Secondary | ICD-10-CM | POA: Diagnosis not present

## 2024-01-29 DIAGNOSIS — E876 Hypokalemia: Secondary | ICD-10-CM | POA: Diagnosis not present

## 2024-01-29 LAB — COMPREHENSIVE METABOLIC PANEL
ALT: 23 U/L (ref 0–44)
AST: 87 U/L — ABNORMAL HIGH (ref 15–41)
Albumin: 3.4 g/dL — ABNORMAL LOW (ref 3.5–5.0)
Alkaline Phosphatase: 88 U/L (ref 38–126)
Anion gap: 14 (ref 5–15)
BUN: 7 mg/dL (ref 6–20)
CO2: 24 mmol/L (ref 22–32)
Calcium: 7.9 mg/dL — ABNORMAL LOW (ref 8.9–10.3)
Chloride: 97 mmol/L — ABNORMAL LOW (ref 98–111)
Creatinine, Ser: 0.68 mg/dL (ref 0.61–1.24)
GFR, Estimated: >60 mL/min (ref >60)
Glucose, Bld: 102 mg/dL — ABNORMAL HIGH (ref 70–99)
Potassium: 4.1 mmol/L (ref 3.5–5.1)
Sodium: 135 mmol/L (ref 135–145)
Total Bilirubin: 7 mg/dL — ABNORMAL HIGH (ref 0.0–1.2)
Total Protein: 7.2 g/dL (ref 6.5–8.1)

## 2024-01-29 LAB — CBC
HCT: 33.8 % — ABNORMAL LOW (ref 39.0–52.0)
Hemoglobin: 11.6 g/dL — ABNORMAL LOW (ref 13.0–17.0)
MCH: 31.4 pg (ref 26.0–34.0)
MCHC: 34.3 g/dL (ref 30.0–36.0)
MCV: 91.4 fL (ref 80.0–100.0)
Platelets: 62 10*3/uL — ABNORMAL LOW (ref 150–400)
RBC: 3.7 MIL/uL — ABNORMAL LOW (ref 4.22–5.81)
RDW: 24.4 % — ABNORMAL HIGH (ref 11.5–15.5)
WBC: 8 10*3/uL (ref 4.0–10.5)
nRBC: 0 % (ref 0.0–0.2)

## 2024-01-29 LAB — PHOSPHORUS: Phosphorus: 3.2 mg/dL (ref 2.5–4.6)

## 2024-01-29 LAB — MAGNESIUM: Magnesium: 1.7 mg/dL (ref 1.7–2.4)

## 2024-01-29 LAB — GLUCOSE, CAPILLARY: Glucose-Capillary: 155 mg/dL — ABNORMAL HIGH (ref 70–99)

## 2024-01-29 MED ORDER — NADOLOL 20 MG PO TABS
20.0000 mg | ORAL_TABLET | Freq: Every day | ORAL | 2 refills | Status: DC
Start: 2024-01-29 — End: 2024-09-27
  Filled 2024-01-29: qty 30, 30d supply, fill #0

## 2024-01-29 NOTE — Plan of Care (Signed)

## 2024-01-29 NOTE — Progress Notes (Signed)
 Reviewed AVS, patient expressed understanding of medications, MD follow up reviewed.   Removed IV, Site clean, dry and intact.  Patient states all belongings brought to the hospital at time of admission are accounted for and packed to take home.  Pt transported to entrance A where family member was waiting in vehicle to transport home.   Picked up medications from [facility] pharmacy.

## 2024-01-29 NOTE — Discharge Summary (Signed)
 PATIENT DETAILS Name: Shane Holmes Age: 38 y.o. Sex: male Date of Birth: 02-Jun-1986 MRN: [id number]. Admitting Physician: Briscoe Deutscher, MD PCP:Sun, Charyl Dancer, MD  Admit Date: 01/27/2024 Discharge date: 01/29/2024  Recommendations for Outpatient Follow-up:  Follow up with PCP in 1-2 weeks Please obtain CMP/CBC in one week Continue counseling regarding abstinence from alcohol.  Admitted From:  Home  Disposition: Home   Discharge Condition: good  CODE STATUS:   Code Status: Full Code   Diet recommendation:  Diet Order             Diet - low sodium heart healthy           Diet regular Room service appropriate? Yes; Fluid consistency: Thin  Diet effective now                    Brief Summary: Patient is a 38 y.o.  male with a EtOH use, Barrett's esophagus, DM-2-who presented with chest pain/hematemesis.   Significant events: 2/25>> admit to [facility]-upper GI bleed-GI consult.   Significant studies: 2/25>> CT abdomen/pelvis: Mild peripancreatic stranding-cirrhosis with portal hypertension. 2/25>> MRI liver: Cirrhosis/hepatomegaly   Significant microbiology data: None   Procedures: 2/25>> EGD: Grade 2 varices, portal hypertensive gastropathy-Barrett's esophagus.   Consults: Gastroenterology  Brief Hospital Course: Upper GI bleeding with acute blood loss anemia GI bleed seems to have resolved-Hb stable-brown stools but mildly while EGD 2/25-no active bleeding Hb stable-tolerating advancement in diet Continue PPI twice daily Continue nadolol on discharge Ensure follow-up with gastroenterology.   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 Biopsy 2/25 negative for dysplasia.   EtOH abuse Last drink was this past Monday No signs of alcohol  withdrawal symptoms Managed with Ativan per [facility] protocol Has been consulted extensively regarding importance of stopping alcohol use   Hypokalemia/hypomagnesemia Repleted.   DM-2 Resume metformin discharge   Peripheral neuropathy Unclear if this is related to diabetes or alcohol Neurontin   Discharge Diagnoses:  Principal Problem:   Hematemesis Active Problems:   Acute blood loss anemia   Hypokalemia   Alcoholic cirrhosis (HCC)   Secondary esophageal varices without bleeding (HCC)   Liver nodule   Elevated liver function tests   Alcohol abuse   Thrombocytopenia (HCC)   Tobacco abuse   GERD (gastroesophageal reflux disease)   Discharge Instructions:  Activity:  As tolerated with Full fall precautions use walker/cane & assistance as needed  Discharge Instructions     Diet - low sodium heart healthy   Complete by: As directed    Discharge instructions   Complete by: As directed    Follow with Primary MD  Salli Real, MD in 1-2 weeks  Please get a complete blood count and chemistry panel checked by your Primary MD at your next visit, and again as instructed by your Primary MD.  Get Medicines reviewed and adjusted: Please take all your medications with you for your next visit with your Primary MD  Laboratory/radiological data: Please request your Primary MD to go over all hospital tests and procedure/radiological results at the follow up, please ask your Primary MD to get all Hospital records sent to his/her office.  In some cases, they will be blood work, cultures and biopsy results pending at the time of your discharge. Please request that your primary care M.D. follows up on these results.  Also Note the following: If you experience  worsening of your admission symptoms, develop shortness of breath, life threatening emergency, suicidal or homicidal thoughts you must seek medical attention immediately by calling 911 or calling your MD immediately  if symptoms less  severe.  You must read complete instructions/literature along with all the possible adverse reactions/side effects for all the Medicines you take and that have been prescribed to you. Take any new Medicines after you have completely understood and accpet all the possible adverse reactions/side effects.   Do not drive when taking Pain medications or sleeping medications (Benzodaizepines)  Do not take more than prescribed Pain, Sleep and Anxiety Medications. It is not advisable to combine anxiety,sleep and pain medications without talking with your primary care practitioner  Special Instructions: If you have smoked or chewed Tobacco  in the last 2 yrs please stop smoking, stop any regular Alcohol  and or any Recreational drug use.  Wear Seat belts while driving.  Please note: You were cared for by a hospitalist during your hospital stay. Once you are discharged, your primary care physician will handle any further medical issues. Please note that NO REFILLS for any discharge medications will be authorized once you are discharged, as it is imperative that you return to your primary care physician (or establish a relationship with a primary care physician if you do not have one) for your post hospital discharge needs so that they can reassess your need for medications and monitor your lab values.   Increase activity slowly   Complete by: As directed       Allergies as of 01/29/2024       Reactions   Lidocaine-glycerin Other (See Comments)   Redness to application site        Medication List     TAKE these medications    albuterol 108 (90 Base) MCG/ACT inhaler Commonly known as: VENTOLIN HFA INHALE 2 PUFFS INTO THE LUNGS EVERY SIX HOURS AS NEEDED FOR WHEEZING OR SHORTNESS OF BREATH.   baclofen 10 MG tablet Commonly known as: LIORESAL Take 10 mg by mouth 2 (two) times daily.   gabapentin 800 MG tablet Commonly known as: NEURONTIN Take 1 tablet (800 mg total) by mouth 3 (three)  times daily.   HYDROcodone-acetaminophen 7.5-325 MG tablet Commonly known as: NORCO Take 1 tablet by mouth 3 (three) times daily as needed.   metFORMIN 500 MG tablet Commonly known as: GLUCOPHAGE Take 1 tablet (500 mg total) by mouth 2 (two) times daily with a meal.   nadolol 20 MG tablet Commonly known as: CORGARD Take 1 tablet (20 mg total) by mouth daily.   pantoprazole 40 MG tablet Commonly known as: PROTONIX TAKE 1 TABLET BY MOUTH TWICE A DAY   thiamine 100 MG tablet Commonly known as: VITAMIN B1 Take 1 tablet (100 mg total) by mouth daily.        Follow-up Information     Salli Real, MD. Schedule an appointment as soon as possible for a visit in 1 week(s).   Specialty: Internal Medicine Contact information: [address] [place] [address] [phone]         Sherrilyn Rist, MD Follow up.   Specialty: Gastroenterology Why: Office will call with date/time, If you dont hear from them,please give them a call Contact information: [address] [place] [address] [phone]                Allergies  Allergen Reactions   Lidocaine-Glycerin Other (See Comments)    Redness  to application site     Other Procedures/Studies: MR LIVER W WO CONTRAST Result Date: 01/27/2024 CLINICAL DATA:  Abnormal CT, cirrhosis EXAM: MRI ABDOMEN WITHOUT AND WITH CONTRAST TECHNIQUE: Multiplanar multisequence MR imaging of the abdomen was performed both before and after the administration of intravenous contrast. CONTRAST:  9mL GADAVIST GADOBUTROL 1 MMOL/ML IV SOLN COMPARISON:  CT abdomen pelvis, 01/27/2024, 06/19/2022 FINDINGS: Lower chest: No acute abnormality. Small hiatal hernia containing the gastric fundus and fluid. Hepatobiliary: Hepatomegaly, maximum coronal span 25.0 cm. Profound hepatic steatosis. Coarse, nodular contour of the liver. Diffuse, heterogeneous nodularity throughout the liver, consistent with regenerative nodularity. No arterially  hyperenhancing lesions. Sludge and gravel in the gallbladder. No gallbladder wall thickening, or biliary dilatation. Pancreas: Unremarkable. No pancreatic ductal dilatation or surrounding inflammatory changes. Spleen: Splenomegaly, maximum coronal span 17.9 cm. Adrenals/Urinary Tract: Adrenal glands are unremarkable. Kidneys are normal, without renal calculi, solid lesion, or hydronephrosis. Stomach/Bowel: Stomach is within normal limits. No evidence of bowel wall thickening, distention, or inflammatory changes. Vascular/Lymphatic: Aortic atherosclerosis. No enlarged abdominal lymph nodes. Other: No abdominal wall hernia or abnormality. Trace perihepatic ascites. Musculoskeletal: No acute or significant osseous findings. IMPRESSION: 1. Cirrhosis and hepatomegaly. Profound hepatic steatosis. 2. Diffuse, heterogeneous nodularity throughout the liver, consistent with regenerative nodularity. No arterially hyperenhancing liver lesions. 3. Splenomegaly and trace perihepatic ascites, consistent with portal hypertension. 4. Sludge and gravel in the gallbladder. 5. Small hiatal hernia containing the gastric fundus and fluid. 6. Aortic atherosclerosis, advanced for patient age. Aortic Atherosclerosis (ICD10-I70.0). Electronically Signed   By: Jearld Lesch M.D.   On: 01/27/2024 21:22   CT ABDOMEN PELVIS W CONTRAST Result Date: 01/27/2024 CLINICAL DATA:  Acute, nonlocalized abdominal pain EXAM: CT ABDOMEN AND PELVIS WITH CONTRAST TECHNIQUE: Multidetector CT imaging of the abdomen and pelvis was performed using the standard protocol following bolus administration of intravenous contrast. RADIATION DOSE REDUCTION: This exam was performed according to the departmental dose-optimization program which includes automated exposure control, adjustment of the mA and/or kV according to patient size and/or use of iterative reconstruction technique. CONTRAST:  75mL OMNIPAQUE IOHEXOL 350 MG/ML SOLN COMPARISON:  12/18/2022 FINDINGS:  Lower chest:  Small hiatal hernia containing fat and ascitic fluid Hepatobiliary: Heterogeneous and enlarged liver with surface lobulation. The periumbilical vein is recanalized. No acute finding.High-density layering in the gallbladder with at 1 calcification. No evidence of biliary inflammation. Pancreas: Subtle peripancreatic stranding. No necrosis or collection. Spleen: Chronic enlargement with 14.5 cm anterior to posterior span. Adrenals/Urinary Tract: Negative adrenals. No hydronephrosis or stone. 13 mm cyst at the upper pole right kidney. Unremarkable bladder. Stomach/Bowel:  No obstruction. No visible bowel inflammation. Vascular/Lymphatic: No acute vascular abnormality, portal venous findings above. No mass or adenopathy. Reproductive:No pathologic findings. Other: No ascites or pneumoperitoneum. Musculoskeletal: No acute abnormalities.  Chronic L5 pars defects. IMPRESSION: 1. Mild peripancreatic stranding, correlate for pancreatitis by labs. 2. Cirrhosis with portal hypertension. Innumerable nodules in the liver may be regenerating nodules but need MRI follow-up. 3. Cholelithiasis.  Full gallbladder but no associated inflammation. Electronically Signed   By: Tiburcio Pea M.D.   On: 01/27/2024 05:05   DG Chest 2 View Result Date: 01/27/2024 CLINICAL DATA:  Chest pain, tachycardia, hematemesis EXAM: CHEST - 2 VIEW COMPARISON:  10/21/2022 FINDINGS: Lungs are clear.  No pleural effusion or pneumothorax. The heart is normal in size. Visualized osseous structures are within normal limits. IMPRESSION: Normal chest radiographs. Electronically Signed   By: Charline Bills M.D.   On: 01/27/2024 02:19  TODAY-DAY OF DISCHARGE:  Subjective:   Shane Holmes today has no headache,no chest abdominal pain,no new weakness tingling or numbness, feels much better wants to go home today.   Objective:   Blood pressure 104/78, pulse 71, temperature 98.6 F (37 C), temperature source Oral, resp. rate 16,  height 5\' 11"  (1.803 m), weight 93 kg, SpO2 97%.  Intake/Output Summary (Last 24 hours) at 01/29/2024 0850 Last data filed at 01/29/2024 0602 Gross per 24 hour  Intake 1631.14 ml  Output 450 ml  Net 1181.14 ml   Filed Weights   01/27/24 0159 01/27/24 0919  Weight: 93.9 kg 93 kg    Exam: Awake Alert, Oriented *3, No new F.N deficits, Normal affect [place].AT,PERRAL Supple Neck,No JVD, No cervical lymphadenopathy appriciated.  Symmetrical Chest wall movement, Good air movement bilaterally, CTAB RRR,No Gallops,Rubs or new Murmurs, No Parasternal Heave +ve B.Sounds, Abd Soft, Non tender, No organomegaly appriciated, No rebound -guarding or rigidity. No Cyanosis, Clubbing or edema, No new Rash or bruise   PERTINENT RADIOLOGIC STUDIES: MR LIVER W WO CONTRAST Result Date: 01/27/2024 CLINICAL DATA:  Abnormal CT, cirrhosis EXAM: MRI ABDOMEN WITHOUT AND WITH CONTRAST TECHNIQUE: Multiplanar multisequence MR imaging of the abdomen was performed both before and after the administration of intravenous contrast. CONTRAST:  9mL GADAVIST GADOBUTROL 1 MMOL/ML IV SOLN COMPARISON:  CT abdomen pelvis, 01/27/2024, 06/19/2022 FINDINGS: Lower chest: No acute abnormality. Small hiatal hernia containing the gastric fundus and fluid. Hepatobiliary: Hepatomegaly, maximum coronal span 25.0 cm. Profound hepatic steatosis. Coarse, nodular contour of the liver. Diffuse, heterogeneous nodularity throughout the liver, consistent with regenerative nodularity. No arterially hyperenhancing lesions. Sludge and gravel in the gallbladder. No gallbladder wall thickening, or biliary dilatation. Pancreas: Unremarkable. No pancreatic ductal dilatation or surrounding inflammatory changes. Spleen: Splenomegaly, maximum coronal span 17.9 cm. Adrenals/Urinary Tract: Adrenal glands are unremarkable. Kidneys are normal, without renal calculi, solid lesion, or hydronephrosis. Stomach/Bowel: Stomach is within normal limits. No evidence of bowel  wall thickening, distention, or inflammatory changes. Vascular/Lymphatic: Aortic atherosclerosis. No enlarged abdominal lymph nodes. Other: No abdominal wall hernia or abnormality. Trace perihepatic ascites. Musculoskeletal: No acute or significant osseous findings. IMPRESSION: 1. Cirrhosis and hepatomegaly. Profound hepatic steatosis. 2. Diffuse, heterogeneous nodularity throughout the liver, consistent with regenerative nodularity. No arterially hyperenhancing liver lesions. 3. Splenomegaly and trace perihepatic ascites, consistent with portal hypertension. 4. Sludge and gravel in the gallbladder. 5. Small hiatal hernia containing the gastric fundus and fluid. 6. Aortic atherosclerosis, advanced for patient age. Aortic Atherosclerosis (ICD10-I70.0). Electronically Signed   By: Jearld Lesch M.D.   On: 01/27/2024 21:22     PERTINENT LAB RESULTS: CBC: Recent Labs    01/28/24 0510 01/29/24 0509  WBC 5.1 8.0  HGB 10.5* 11.6*  HCT 29.5* 33.8*  PLT 52* 62*   CMET CMP     Component Value Date/Time   NA 135 01/29/2024 0509   K 4.1 01/29/2024 0509   CL 97 (L) 01/29/2024 0509   CO2 24 01/29/2024 0509   GLUCOSE 102 (H) 01/29/2024 0509   BUN 7 01/29/2024 0509   CREATININE 0.68 01/29/2024 0509   CALCIUM 7.9 (L) 01/29/2024 0509   PROT 7.2 01/29/2024 0509   ALBUMIN 3.4 (L) 01/29/2024 0509   AST 87 (H) 01/29/2024 0509   ALT 23 01/29/2024 0509   ALKPHOS 88 01/29/2024 0509   BILITOT 7.0 (H) 01/29/2024 0509   GFRNONAA >60 01/29/2024 0509    GFR Estimated Creatinine Clearance: 145.9 mL/min (by C-G formula based on SCr of 0.68 mg/dL). Recent  Labs    01/27/24 0411  LIPASE 40   No results for input(s): "CKTOTAL", "CKMB", "CKMBINDEX", "TROPONINI" in the last 72 hours. Invalid input(s): "POCBNP" No results for input(s): "DDIMER" in the last 72 hours. Recent Labs    01/28/24 1430  HGBA1C 4.9   No results for input(s): "CHOL", "HDL", "LDLCALC", "TRIG", "CHOLHDL", "LDLDIRECT" in the last 72  hours. No results for input(s): "TSH", "T4TOTAL", "T3FREE", "THYROIDAB" in the last 72 hours.  Invalid input(s): "FREET3" No results for input(s): "VITAMINB12", "FOLATE", "FERRITIN", "TIBC", "IRON", "RETICCTPCT" in the last 72 hours. Coags: Recent Labs    01/27/24 0258  INR 1.4*   Microbiology: No results found for this or any previous visit (from the past 240 hours).  FURTHER DISCHARGE INSTRUCTIONS:  Get Medicines reviewed and adjusted: Please take all your medications with you for your next visit with your Primary MD  Laboratory/radiological data: Please request your Primary MD to go over all hospital tests and procedure/radiological results at the follow up, please ask your Primary MD to get all Hospital records sent to his/her office.  In some cases, they will be blood work, cultures and biopsy results pending at the time of your discharge. Please request that your primary care M.D. goes through all the records of your hospital data and follows up on these results.  Also Note the following: If you experience worsening of your admission symptoms, develop shortness of breath, life threatening emergency, suicidal or homicidal thoughts you must seek medical attention immediately by calling 911 or calling your MD immediately  if symptoms less severe.  You must read complete instructions/literature along with all the possible adverse reactions/side effects for all the Medicines you take and that have been prescribed to you. Take any new Medicines after you have completely understood and accpet all the possible adverse reactions/side effects.   Do not drive when taking Pain medications or sleeping medications (Benzodaizepines)  Do not take more than prescribed Pain, Sleep and Anxiety Medications. It is not advisable to combine anxiety,sleep and pain medications without talking with your primary care practitioner  Special Instructions: If you have smoked or chewed Tobacco  in the last 2  yrs please stop smoking, stop any regular Alcohol  and or any Recreational drug use.  Wear Seat belts while driving.  Please note: You were cared for by a hospitalist during your hospital stay. Once you are discharged, your primary care physician will handle any further medical issues. Please note that NO REFILLS for any discharge medications will be authorized once you are discharged, as it is imperative that you return to your primary care physician (or establish a relationship with a primary care physician if you do not have one) for your post hospital discharge needs so that they can reassess your need for medications and monitor your lab values.  Total Time spent coordinating discharge including counseling, education and face to face time equals greater than 30 minutes.  SignedJeoffrey Massed 01/29/2024 8:50 AM

## 2024-01-29 NOTE — Progress Notes (Signed)
 Patient discharge for home.

## 2024-01-30 ENCOUNTER — Encounter: Payer: Self-pay | Admitting: Gastroenterology

## 2024-02-02 ENCOUNTER — Telehealth: Payer: Self-pay

## 2024-02-02 NOTE — Telephone Encounter (Signed)
-----   Message from Mike Gip sent at 01/30/2024  4:26 PM EST ----- Regarding: office appt This pt was dishcarged yesterday - new- Cirrhosis, GI bleeding , nonbleeding varices  Will be a [facility] pt - Please get him an appt with Dr Myrtie Neither , or whatever APP might land in his POD, and call the pt with appt  Thanks, you can ask whomever to assist you, and can do on monday

## 2024-02-03 NOTE — Telephone Encounter (Signed)
 Called PT to schedule. Left a VM to confirm if he can take appointment for 3/20 @ 2 with Doug Sou.

## 2024-02-12 NOTE — Telephone Encounter (Signed)
Left message asking patient to call us back. 

## 2024-02-19 ENCOUNTER — Ambulatory Visit: Admitting: Gastroenterology

## 2024-02-27 ENCOUNTER — Other Ambulatory Visit: Payer: Self-pay | Admitting: Neurology

## 2024-02-27 DIAGNOSIS — K920 Hematemesis: Secondary | ICD-10-CM

## 2024-03-03 ENCOUNTER — Other Ambulatory Visit: Payer: Self-pay

## 2024-03-03 NOTE — Telephone Encounter (Signed)
 Letter mailed to the patient.  He was unable to keep the appointment offered. He has not called back to reschedule.

## 2024-04-27 ENCOUNTER — Telehealth: Admitting: Physician Assistant

## 2024-04-27 DIAGNOSIS — K047 Periapical abscess without sinus: Secondary | ICD-10-CM | POA: Diagnosis not present

## 2024-04-27 MED ORDER — AMOXICILLIN-POT CLAVULANATE 875-125 MG PO TABS: 1.0000 | ORAL_TABLET | Freq: Two times a day (BID) | ORAL | 0 refills | Status: DC

## 2024-04-27 NOTE — Progress Notes (Signed)

## 2024-04-27 NOTE — Progress Notes (Signed)
 I have spent 5 minutes in review of e-visit questionnaire, review and updating patient chart, medical decision making and response to patient.   Piedad Climes, PA-C

## 2024-06-11 ENCOUNTER — Encounter: Payer: Self-pay | Admitting: Neurology

## 2024-07-08 ENCOUNTER — Telehealth: Admitting: Physician Assistant

## 2024-07-08 DIAGNOSIS — K047 Periapical abscess without sinus: Secondary | ICD-10-CM

## 2024-07-08 MED ORDER — AMOXICILLIN-POT CLAVULANATE 875-125 MG PO TABS: 1.0000 | ORAL_TABLET | Freq: Two times a day (BID) | ORAL | 0 refills | Status: DC

## 2024-07-08 MED ORDER — NAPROXEN 500 MG PO TABS: 500.0000 mg | ORAL_TABLET | Freq: Two times a day (BID) | ORAL | 0 refills | Status: AC

## 2024-07-08 NOTE — Progress Notes (Signed)
 I have spent 5 minutes in review of e-visit questionnaire, review and updating patient chart, medical decision making and response to patient.   Laure Kidney, PA-C

## 2024-07-08 NOTE — Progress Notes (Signed)

## 2024-08-23 ENCOUNTER — Telehealth: Admitting: Physician Assistant

## 2024-08-23 DIAGNOSIS — K047 Periapical abscess without sinus: Secondary | ICD-10-CM

## 2024-08-23 MED ORDER — AMOXICILLIN-POT CLAVULANATE 875-125 MG PO TABS: 1.0000 | ORAL_TABLET | Freq: Two times a day (BID) | ORAL | 0 refills | Status: DC

## 2024-08-23 NOTE — Progress Notes (Signed)

## 2024-08-23 NOTE — Progress Notes (Signed)
 I have spent 5 minutes in review of e-visit questionnaire, review and updating patient chart, medical decision making and response to patient.   Elsie Velma Lunger, PA-C

## 2024-09-10 ENCOUNTER — Ambulatory Visit: Payer: Medicaid Other | Admitting: Neurology

## 2024-09-15 NOTE — Progress Notes (Signed)
 I saw Shane Holmes in neurology clinic on 09/23/24 in follow up for neuropathy.  HPI: Shane Holmes is a 38 y.o. year old male with a history of DM, asthma, EtOH abuse, back pain who we last saw on 09/11/23.  To briefly review: 08/27/22: Patient's symptoms started about 1.5 years ago (~beginning of 2022). He had gradually had muscle aches that worsened. He had to be hospitalized about 1.5 years ago for this. He was doing better, but then things progressed. He currently has numbness and electric sensations at least from his thighs to the tips of feet. Patient has a lot of cramps in his legs, particularly in the hamstrings. The spasms occur only at night, when he lays down. They were very consistent until more recently as they have calmed and become more random now. He has great difficulty walking, especially in the mornings. He feels pressure when walking. He denies any significant change in back pain.   Patient was seen in the ED 06/07/22 for muscle spasms. No acute abnormalities were found, so patient was referred to outpatient neurology.   Patient was previously evaluated by inpatient neurology, first in 01/24/21 when he bilateral leg weakness in the setting of COVID + and GI bleed. There was some concern for GBS so MRI and LP were performed. LP was normal. MRI showed chronic L5 pars fractures with degeneration but no spinal stenosis or neural impingement. Patient was diagnosed with alcoholic polyneuropathy and asked to follow up with neurology as outpatient, which he never did.    He was seen again by inpatient neurology on 01/09/22 for acute onset BLE numbness and weakness and RUE weakness and numbness. Exam was suggestive of functional weakness at that time. MRI cervical and thoracic spine was recommended, but patient would not lay still for MRI. He left the ED prior to being discharged.   The patient has not had similar episodes of symptoms in the past.    Any change in urine color, especially  after exertion/physical activity? No   The patient has not noticed any recent skin rashes nor does he report any constitutional symptoms like fever, night sweats, anorexia or unintentional weight loss.   EtOH use: Currently drinks 6 beers per day; 12 beers per day (per patient report in 2022) Restrictive diet? No Family history of neuropathy/myopathy/NM disease? No   03/06/23: Labs were significant for low B1. I recommended supplementation with 100 mg daily of thiamine . He is taking this as well as vit D supplement.   EMG on 09/30/22 showed severe axonal large fiber sensorimotor polyneuropathy with active denervation changes seen in distal muscles.   Since last visit, patient has been to the ED many times for different problems, but multiple times with pain in legs.    Gabapentin  has helped with leg jerking. Hydrocodone  was prescribed by pain management (seen by [facility]). Patient tried Cymbalta but had very bad reaction (he could not move and had to go to the hospital). He was also prescribed Nortriptyline  25 mg by ED but this did not help.   He is establishing care with PCP on Monday (also [facility]) for DM.   Currently he is drinking 2-3 drinks per day (previously about 6 beers per day). He has cut out liquor completely.   Patient denies falls, but girlfriend mentions he has fallen a couple of times.  09/11/23: Patient is having some spasms in his feet, but otherwise similar to prior. He has to be careful ambulating, but has  not had any falls. Gabapentin  was increased to 800 mg TID on 04/29/23. This is working well for patient.   He continues to take B1 supplementation. He has established with PCP at [facility] with Dr. Talitha Repress (who now also does his pain management).   In terms of EtOH use, he is still drinking on weekends and having a couple drinks. He is still working toward sobriety.  Most recent Assessment and Plan (09/11/23): This is Shane Holmes, a 38 y.o.  male with neuropathy (severe axonal by EMG on 09/30/22) likely secondary to EtOH abuse, thiamine  deficiency, and DM. His neuropathic symptoms continue to improve but are likely still being exacerbated by ongoing EtOH use (though patient continues to cut back) and DM.    Plan: -Continue Gabapentin  800 mg TID -Continue B1 100 mg daily -Continue home exercises given by PT -Discussed continuing to cut back on EtOH -Fall precautions discussed  Since their last visit: Patient feels like his neuropathy in the legs are stable. He has good and bad days. He has about 2 falls in the last year (one time his leg gave out, one he tripped over the dog). He inspects his feet daily.  He has been having right hip pain. He is getting injections by PCP every few months that significantly helps.  He continues to take gabapentin  800 mg TID, and B1 100 mg daily. He also takes hydrocodone  given by pain management.  Regarding EtOH, he is no longer drinking daily, but says the best he can describe is every now and then.  He sometimes walks with a cane but is unassisted today.   MEDICATIONS:  Outpatient Encounter Medications as of 09/23/2024  Medication Sig   albuterol  (VENTOLIN  HFA) 108 (90 Base) MCG/ACT inhaler INHALE 2 PUFFS INTO THE LUNGS EVERY SIX HOURS AS NEEDED FOR WHEEZING OR SHORTNESS OF BREATH.   baclofen  (LIORESAL ) 10 MG tablet Take 10 mg by mouth 2 (two) times daily.   gabapentin  (NEURONTIN ) 800 MG tablet Take 1 tablet (800 mg total) by mouth 3 (three) times daily.   HYDROcodone -acetaminophen  (NORCO) 7.5-325 MG tablet Take 1 tablet by mouth 3 (three) times daily as needed. (Patient taking differently: Take 1 tablet by mouth 3 (three) times daily.)   metFORMIN  (GLUCOPHAGE ) 500 MG tablet Take 1 tablet (500 mg total) by mouth 2 (two) times daily with a meal.   pantoprazole  (PROTONIX ) 40 MG tablet TAKE 1 TABLET BY MOUTH TWICE A DAY   amoxicillin -clavulanate (AUGMENTIN ) 875-125 MG tablet Take 1 tablet by  mouth 2 (two) times daily. (Patient not taking: Reported on 09/23/2024)   nadolol  (CORGARD ) 20 MG tablet Take 1 tablet (20 mg total) by mouth daily. (Patient not taking: Reported on 09/23/2024)   thiamine  (VITAMIN B1) 100 MG tablet Take 1 tablet (100 mg total) by mouth daily.   [DISCONTINUED] dicyclomine  (BENTYL ) 20 MG tablet Take 1 tablet (20 mg total) by mouth 2 (two) times daily. (Patient not taking: Reported on 11/02/2019)   No facility-administered encounter medications on file as of 09/23/2024.    PAST MEDICAL HISTORY: Past Medical History:  Diagnosis Date   Alcohol abuse    Asthma    Back pain    Bulging lumbar disc    COVID    01/2021   Diabetes mellitus without complication (HCC)    Encounter for lumbar puncture    Hematochezia    Hypokalemia    Hyponatremia    Lower GI bleed    Neuropathy     PAST  SURGICAL HISTORY: Past Surgical History:  Procedure Laterality Date   BIOPSY  06/19/2022   Procedure: BIOPSY;  Surgeon: Legrand Victory Holmes DOUGLAS, MD;  Location: [facility] ENDOSCOPY;  Service: Gastroenterology;;   BIOPSY  01/27/2024   Procedure: BIOPSY;  Surgeon: Legrand Victory Holmes DOUGLAS, MD;  Location: [facility] ENDOSCOPY;  Service: Gastroenterology;;   ESOPHAGOGASTRODUODENOSCOPY (EGD) WITH PROPOFOL  N/A 06/19/2022   Procedure: ESOPHAGOGASTRODUODENOSCOPY (EGD) WITH PROPOFOL ;  Surgeon: Legrand Victory Holmes DOUGLAS, MD;  Location: [facility] ENDOSCOPY;  Service: Gastroenterology;  Laterality: N/A;   ESOPHAGOGASTRODUODENOSCOPY (EGD) WITH PROPOFOL  N/A 01/27/2024   Procedure: ESOPHAGOGASTRODUODENOSCOPY (EGD) WITH PROPOFOL ;  Surgeon: Legrand Victory Holmes DOUGLAS, MD;  Location: [facility] ENDOSCOPY;  Service: Gastroenterology;  Laterality: N/A;    ALLERGIES: Allergies  Allergen Reactions   Lidocaine -Glycerin Other (See Comments)    Redness to application site    FAMILY HISTORY: Family History  Problem Relation Age of Onset   Cirrhosis Mother    Cirrhosis Father    Cancer Maternal Grandfather    Cancer Paternal Grandfather    Diabetes  Maternal Aunt    Hypertension Neg Hx     SOCIAL HISTORY: Social History   Tobacco Use   Smoking status: Some Days    Types: Cigarettes   Smokeless tobacco: Never   Tobacco comments:    Close to a pack at day--1/2 pack at day 09/10/24  Vaping Use   Vaping status: Never Used  Substance Use Topics   Alcohol use: Yes    Alcohol/week: 6.0 standard drinks of alcohol    Types: 6 Cans of beer per week    Comment: occas   Drug use: Yes    Comment: cocaine positive 01/2022/ CbD   Social History   Social History Narrative   Right handed   Caffeine none   Live in one level home      Are you currently employed ?    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   Who lives with you? Girlfriend   What type of home do you live in: 1 story or 2 story? one        Objective:  Vital Signs:  BP 111/72   Pulse (!) 107   Ht 5' 11 (1.803 m)   Wt 204 lb (92.5 kg)   SpO2 98%   BMI 28.45 kg/m   General: General appearance: Awake and alert. No distress. Cooperative with exam.  Skin: No obvious rash or jaundice. HEENT: Atraumatic. Anicteric. Lungs: Non-labored breathing on room air  Heart: Regular Extremities: No edema. No obvious deformity.    Neurological: Mental Status: Alert. Speech fluent. No pseudobulbar affect Cranial Nerves: CNII: No RAPD. Visual fields intact. CNIII, IV, VI: PERRL. No nystagmus. EOMI. CN V: Facial sensation intact bilaterally to fine touch. CN VII: Facial muscles symmetric and strong. No ptosis at rest. CN VIII: Hears finger rub well bilaterally. CN IX: No hypophonia. CN X: Palate elevates symmetrically. CN XI: Full strength shoulder shrug bilaterally. CN XII: Tongue protrusion full and midline. No atrophy or fasciculations. No significant dysarthria Motor: Tone is normal. Strength is 5/5 in bilateral upper and lower extremities Reflexes:  Right Left  Bicep 2+ 2+  Tricep 2+ 2+  BrRad 2+ 2+  Knee Trace Trace   Ankle 0 0   Sensation: Pinprick: Diminished to bilateral lower calves in lower extremities. Intact in upper extremities Coordination: Intact finger-to- nose-finger bilaterally.  Gait: Wide based, antalgic gait.   Lab and Test Review: New results: 01/29/24: CMP  significant for AST 87, ALT 23, tBili 7.0 CBC significant for Hb 11.6, MCV 91.4, plts 62 HbA1c: 4.9  01/27/24: HIV non-reactive EtOH 175  MRI liver w/wo contrast (01/27/24): IMPRESSION: 1. Cirrhosis and hepatomegaly. Profound hepatic steatosis. 2. Diffuse, heterogeneous nodularity throughout the liver, consistent with regenerative nodularity. No arterially hyperenhancing liver lesions. 3. Splenomegaly and trace perihepatic ascites, consistent with portal hypertension. 4. Sludge and gravel in the gallbladder. 5. Small hiatal hernia containing the gastric fundus and fluid. 6. Aortic atherosclerosis, advanced for patient age.  Previously reviewed results: 08/27/22: Vit D wnl PTH wnl IFE no M protein Iron studies: ferritin low at 37 Ionized Ca: mildly low 4.6 (normal is 4.7) B1: < 6   EtOH (10/21/22): 23 EtOH (12/18/22): 46 CMP (02/13/23): Component     Latest Ref Rng 02/13/2023  Sodium     135 - 145 mmol/L 132 (L)   Potassium     3.5 - 5.1 mmol/L 4.2   Chloride     98 - 111 mmol/L 103   CO2     22 - 32 mmol/L 15 (L)   Glucose     70 - 99 mg/dL 719 (H)   BUN     6 - 20 mg/dL 5 (L)   Creatinine     0.61 - 1.24 mg/dL 9.42 (L)   Calcium      8.9 - 10.3 mg/dL 8.8 (L)   Total Protein     6.5 - 8.1 g/dL 8.7 (H)   Albumin     3.5 - 5.0 g/dL 4.6   AST     15 - 41 U/L 21   ALT     0 - 44 U/L 14   Alkaline Phosphatase     38 - 126 U/L 64   Total Bilirubin     0.3 - 1.2 mg/dL 0.8   GFR, Estimated     >60 mL/min >60   Anion gap     5 - 15  14     Normal or unremarkable: CK (59)  06/20/22: CBC significant for anemia (Hb of 9.5), platelets low at 112 BMP significant for glucose to 132, calcium  to 8.1 B12:  449 HbA1c: 7.4   EtOH (in ED on 06/07/22): 202, (01/09/22): 274, (11/02/19): 211   MRI lumbar spine w/wo contrast (01/25/21): IMPRESSION: 1. Small 2 cm area of soft tissue inflammation/myositis in the left erector spinous muscle at L4-L5. There is regional chronic L4-L5 facet degeneration (see #2) but no associated osseous abnormality and no other acute or inflammatory process in the lumbar spine.   2. 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 But normal lumbar discs with no spinal stenosis or neural impingement.   MRI cervical and thoracic spine wo contrast (01/09/22): IMPRESSION: 1. Motion degraded exam. 2. Slight anterior/ventral displacement of the upper thoracic spinal cord at the level of T4-5. Finding raises the possibility for a subtle extramedullary, intradural lesion at this location, possibly an arachnoid cyst, although this is not definitely seen on this motion degraded exam. Possible focal spinal cord herniation could also be considered. No visible associated cord signal changes. Further evaluation with dedicated CT myelography may be helpful for further evaluation as warranted. Postcontrast imaging could also be considered. 3. Otherwise unremarkable and normal MRI of the cervical and thoracic spine. No significant disc pathology, stenosis, or evidence for neural impingement.   MRI cervical, thoracic, and lumbar spine (01/10/22): IMPRESSION: 1. 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 No underlying intrathecal mass,  cord herniation, or loss of CSF flow voids- appearance usually from arachnoid web. 2. Normal cord and cauda equina signal. 3. Chronic L5 pars defects.   MRI brain w/wo contrast (01/10/22): IMPRESSION: Normal MRI of the brain.  ASSESSMENT: This is Shane Holmes Mania, a 38 y.o. male with neuropathy (severe axonal by EMG on 09/30/22) likely secondary to EtOH abuse, thiamine  deficiency, and DM. His neuropathic symptoms are stable but are likely  still being exacerbated by ongoing EtOH use (though patient continues to cut back) and DM.   Plan: -Continue B1 100 mg daily -Increase gabapentin  900 mg three times daily -Follow up with pain management as planned -Continue to cut back on EtOH -Fall precautions discussed  Return to clinic in 1 year  Total time spent reviewing records, interview, history/exam, documentation, and coordination of care on day of encounter:  40 min  Venetia Potters, MD

## 2024-09-23 ENCOUNTER — Encounter: Payer: Self-pay | Admitting: Neurology

## 2024-09-23 ENCOUNTER — Ambulatory Visit: Admitting: Neurology

## 2024-09-23 VITALS — BP 111/72 | HR 107 | Ht 71.0 in | Wt 204.0 lb

## 2024-09-23 DIAGNOSIS — E1142 Type 2 diabetes mellitus with diabetic polyneuropathy: Secondary | ICD-10-CM

## 2024-09-23 DIAGNOSIS — G621 Alcoholic polyneuropathy: Secondary | ICD-10-CM | POA: Diagnosis not present

## 2024-09-23 DIAGNOSIS — F10939 Alcohol use, unspecified with withdrawal, unspecified: Secondary | ICD-10-CM

## 2024-09-23 DIAGNOSIS — E519 Thiamine deficiency, unspecified: Secondary | ICD-10-CM

## 2024-09-23 DIAGNOSIS — R29898 Other symptoms and signs involving the musculoskeletal system: Secondary | ICD-10-CM

## 2024-09-23 DIAGNOSIS — G629 Polyneuropathy, unspecified: Secondary | ICD-10-CM | POA: Diagnosis not present

## 2024-09-23 DIAGNOSIS — R209 Unspecified disturbances of skin sensation: Secondary | ICD-10-CM

## 2024-09-23 DIAGNOSIS — M79604 Pain in right leg: Secondary | ICD-10-CM

## 2024-09-23 DIAGNOSIS — M79605 Pain in left leg: Secondary | ICD-10-CM

## 2024-09-23 MED ORDER — GABAPENTIN 100 MG PO CAPS: ORAL_CAPSULE | ORAL | 11 refills | Status: AC

## 2024-09-23 MED ORDER — GABAPENTIN 800 MG PO TABS: ORAL_TABLET | ORAL | 11 refills | Status: AC

## 2024-09-23 NOTE — Patient Instructions (Signed)
 -Continue B1 100 mg daily -Increase gabapentin  900 mg three times daily (800 mg tablet and 100 mg tablet together) -Follow up with pain management as planned -Continue to cut back on alcohol  I will see you again in 1 year  Please let me know if you have any questions or concerns in the meantime.  Venetia Potters, MD [facility]  Preventing Falls at [facility] are common, often dreaded events in the lives of older people. Aside from the obvious injuries and even death that may result, fall can cause wide-ranging consequences including loss of independence, mental decline, decreased activity and mobility. Younger people are also at risk of falling, especially those with chronic illnesses and fatigue.  Ways to reduce risk for falling Examine diet and medications. Warm foods and alcohol dilate blood vessels, which can lead to dizziness when standing. Sleep aids, antidepressants and pain medications can also increase the likelihood of a fall.  Get a vision exam. Poor vision, cataracts and glaucoma increase the chances of falling.  Check foot gear. Shoes should fit snugly and have a sturdy, nonskid sole and a broad, low heel  Participate in a physician-approved exercise program to build and maintain muscle strength and improve balance and coordination. Programs that use ankle weights or stretch bands are excellent for muscle-strengthening. Water aerobics programs and low-impact Tai Chi programs have also been shown to improve balance and coordination.  Increase vitamin D  intake. Vitamin D  improves muscle strength and increases the amount of calcium  the body is able to absorb and deposit in bones.  How to prevent falls from common hazards Floors - Remove all loose wires, cords, and throw rugs. Minimize clutter. Make sure rugs are anchored and smooth. Keep furniture in its usual place.  Chairs -- Use chairs with straight backs, armrests and firm seats. Add firm cushions to existing pieces  to add height.  Bathroom - Install grab bars and non-skid tape in the tub or shower. Use a bathtub transfer bench or a shower chair with a back support Use an elevated toilet seat and/or safety rails to assist standing from a low surface. Do not use towel racks or bathroom tissue holders to help you stand.  Lighting - Make sure halls, stairways, and entrances are well-lit. Install a night light in your bathroom or hallway. Make sure there is a light switch at the top and bottom of the staircase. Turn lights on if you get up in the middle of the night. Make sure lamps or light switches are within reach of the bed if you have to get up during the night.  Kitchen - Install non-skid rubber mats near the sink and stove. Clean spills immediately. Store frequently used utensils, pots, pans between waist and eye level. This helps prevent reaching and bending. Sit when getting things out of lower cupboards.  Living room/ Bedrooms - Place furniture with wide spaces in between, giving enough room to move around. Establish a route through the living room that gives you something to hold onto as you walk.  Stairs - Make sure treads, rails, and rugs are secure. Install a rail on both sides of the stairs. If stairs are a threat, it might be helpful to arrange most of your activities on the lower level to reduce the number of times you must climb the stairs.  Entrances and doorways - Install metal handles on the walls adjacent to the doorknobs of all doors to make it more secure as you travel through the  doorway.  Tips for maintaining balance Keep at least one hand free at all times. Try using a backpack or fanny pack to hold things rather than carrying them in your hands. Never carry objects in both hands when walking as this interferes with keeping your balance.  Attempt to swing both arms from front to back while walking. This might require a conscious effort if Parkinson's disease has diminished your movement.  It will, however, help you to maintain balance and posture, and reduce fatigue.  Consciously lift your feet off of the ground when walking. Shuffling and dragging of the feet is a common culprit in losing your balance.  When trying to navigate turns, use a U technique of facing forward and making a wide turn, rather than pivoting sharply.  Try to stand with your feet shoulder-length apart. When your feet are close together for any length of time, you increase your risk of losing your balance and falling.  Do one thing at a time. Don't try to walk and accomplish another task, such as reading or looking around. The decrease in your automatic reflexes complicates motor function, so the less distraction, the better.  Do not wear rubber or gripping soled shoes, they might catch on the floor and cause tripping.  Move slowly when changing positions. Use deliberate, concentrated movements and, if needed, use a grab bar or walking aid. Count 15 seconds between each movement. For example, when rising from a seated position, wait 15 seconds after standing to begin walking.  If balance is a continuous problem, you might want to consider a walking aid such as a cane, walking stick, or walker. Once you've mastered walking with help, you might be ready to try it on your own again.

## 2024-09-24 ENCOUNTER — Inpatient Hospital Stay (HOSPITAL_COMMUNITY)
Admission: EM | Admit: 2024-09-24 | Discharge: 2024-09-27 | DRG: 432 | Disposition: A | Discharge status: Home / Self Care | Attending: Internal Medicine | Admitting: Internal Medicine

## 2024-09-24 ENCOUNTER — Other Ambulatory Visit: Payer: Self-pay

## 2024-09-24 ENCOUNTER — Emergency Department (HOSPITAL_COMMUNITY)

## 2024-09-24 DIAGNOSIS — I8500 Esophageal varices without bleeding: Secondary | ICD-10-CM | POA: Diagnosis not present

## 2024-09-24 DIAGNOSIS — K7030 Alcoholic cirrhosis of liver without ascites: Secondary | ICD-10-CM | POA: Diagnosis present

## 2024-09-24 DIAGNOSIS — Z635 Disruption of family by separation and divorce: Secondary | ICD-10-CM

## 2024-09-24 DIAGNOSIS — K219 Gastro-esophageal reflux disease without esophagitis: Secondary | ICD-10-CM | POA: Diagnosis present

## 2024-09-24 DIAGNOSIS — E876 Hypokalemia: Secondary | ICD-10-CM | POA: Diagnosis present

## 2024-09-24 DIAGNOSIS — D62 Acute posthemorrhagic anemia: Secondary | ICD-10-CM | POA: Diagnosis present

## 2024-09-24 DIAGNOSIS — K8020 Calculus of gallbladder without cholecystitis without obstruction: Secondary | ICD-10-CM | POA: Diagnosis present

## 2024-09-24 DIAGNOSIS — E8809 Other disorders of plasma-protein metabolism, not elsewhere classified: Secondary | ICD-10-CM | POA: Diagnosis present

## 2024-09-24 DIAGNOSIS — E1142 Type 2 diabetes mellitus with diabetic polyneuropathy: Secondary | ICD-10-CM | POA: Diagnosis present

## 2024-09-24 DIAGNOSIS — D684 Acquired coagulation factor deficiency: Secondary | ICD-10-CM | POA: Diagnosis present

## 2024-09-24 DIAGNOSIS — E861 Hypovolemia: Secondary | ICD-10-CM | POA: Diagnosis present

## 2024-09-24 DIAGNOSIS — Y905 Blood alcohol level of 100-119 mg/100 ml: Secondary | ICD-10-CM | POA: Diagnosis present

## 2024-09-24 DIAGNOSIS — K7460 Unspecified cirrhosis of liver: Secondary | ICD-10-CM | POA: Diagnosis not present

## 2024-09-24 DIAGNOSIS — I8511 Secondary esophageal varices with bleeding: Secondary | ICD-10-CM | POA: Diagnosis present

## 2024-09-24 DIAGNOSIS — K2270 Barrett's esophagus without dysplasia: Secondary | ICD-10-CM | POA: Diagnosis present

## 2024-09-24 DIAGNOSIS — Z8711 Personal history of peptic ulcer disease: Secondary | ICD-10-CM

## 2024-09-24 DIAGNOSIS — K922 Gastrointestinal hemorrhage, unspecified: Principal | ICD-10-CM | POA: Diagnosis present

## 2024-09-24 DIAGNOSIS — I959 Hypotension, unspecified: Secondary | ICD-10-CM

## 2024-09-24 DIAGNOSIS — Z7984 Long term (current) use of oral hypoglycemic drugs: Secondary | ICD-10-CM

## 2024-09-24 DIAGNOSIS — J45909 Unspecified asthma, uncomplicated: Secondary | ICD-10-CM | POA: Diagnosis present

## 2024-09-24 DIAGNOSIS — K254 Chronic or unspecified gastric ulcer with hemorrhage: Secondary | ICD-10-CM | POA: Diagnosis present

## 2024-09-24 DIAGNOSIS — D689 Coagulation defect, unspecified: Secondary | ICD-10-CM | POA: Diagnosis not present

## 2024-09-24 DIAGNOSIS — F17210 Nicotine dependence, cigarettes, uncomplicated: Secondary | ICD-10-CM | POA: Diagnosis present

## 2024-09-24 DIAGNOSIS — G253 Myoclonus: Secondary | ICD-10-CM | POA: Diagnosis present

## 2024-09-24 DIAGNOSIS — K259 Gastric ulcer, unspecified as acute or chronic, without hemorrhage or perforation: Secondary | ICD-10-CM | POA: Diagnosis not present

## 2024-09-24 DIAGNOSIS — Z79899 Other long term (current) drug therapy: Secondary | ICD-10-CM

## 2024-09-24 DIAGNOSIS — E519 Thiamine deficiency, unspecified: Secondary | ICD-10-CM | POA: Diagnosis present

## 2024-09-24 DIAGNOSIS — E872 Acidosis, unspecified: Secondary | ICD-10-CM | POA: Diagnosis present

## 2024-09-24 DIAGNOSIS — F1010 Alcohol abuse, uncomplicated: Secondary | ICD-10-CM | POA: Diagnosis present

## 2024-09-24 DIAGNOSIS — D638 Anemia in other chronic diseases classified elsewhere: Secondary | ICD-10-CM | POA: Diagnosis present

## 2024-09-24 DIAGNOSIS — D6959 Other secondary thrombocytopenia: Secondary | ICD-10-CM | POA: Diagnosis present

## 2024-09-24 DIAGNOSIS — K449 Diaphragmatic hernia without obstruction or gangrene: Secondary | ICD-10-CM | POA: Diagnosis present

## 2024-09-24 DIAGNOSIS — I9589 Other hypotension: Secondary | ICD-10-CM | POA: Diagnosis present

## 2024-09-24 DIAGNOSIS — K921 Melena: Secondary | ICD-10-CM | POA: Diagnosis not present

## 2024-09-24 DIAGNOSIS — K766 Portal hypertension: Secondary | ICD-10-CM | POA: Diagnosis present

## 2024-09-24 DIAGNOSIS — Z884 Allergy status to anesthetic agent status: Secondary | ICD-10-CM

## 2024-09-24 DIAGNOSIS — G8929 Other chronic pain: Secondary | ICD-10-CM | POA: Diagnosis present

## 2024-09-24 DIAGNOSIS — K920 Hematemesis: Secondary | ICD-10-CM | POA: Diagnosis not present

## 2024-09-24 DIAGNOSIS — Z833 Family history of diabetes mellitus: Secondary | ICD-10-CM

## 2024-09-24 DIAGNOSIS — K3189 Other diseases of stomach and duodenum: Secondary | ICD-10-CM | POA: Diagnosis present

## 2024-09-24 DIAGNOSIS — D696 Thrombocytopenia, unspecified: Secondary | ICD-10-CM | POA: Diagnosis not present

## 2024-09-24 DIAGNOSIS — R162 Hepatomegaly with splenomegaly, not elsewhere classified: Secondary | ICD-10-CM | POA: Diagnosis present

## 2024-09-24 DIAGNOSIS — R109 Unspecified abdominal pain: Secondary | ICD-10-CM

## 2024-09-24 LAB — COMPREHENSIVE METABOLIC PANEL WITH GFR
ALT: 14 U/L (ref 0–44)
AST: 76 U/L — ABNORMAL HIGH (ref 15–41)
Albumin: 3.1 g/dL — ABNORMAL LOW (ref 3.5–5.0)
Alkaline Phosphatase: 97 U/L (ref 38–126)
Anion gap: 27 — ABNORMAL HIGH (ref 5–15)
BUN: 11 mg/dL (ref 6–20)
CO2: 15 mmol/L — ABNORMAL LOW (ref 22–32)
Calcium: 7.9 mg/dL — ABNORMAL LOW (ref 8.9–10.3)
Chloride: 93 mmol/L — ABNORMAL LOW (ref 98–111)
Creatinine, Ser: 0.79 mg/dL (ref 0.61–1.24)
GFR, Estimated: >60 mL/min (ref >60)
Glucose, Bld: 74 mg/dL (ref 70–99)
Potassium: 3.5 mmol/L (ref 3.5–5.1)
Sodium: 135 mmol/L (ref 135–145)
Total Bilirubin: 4.8 mg/dL — ABNORMAL HIGH (ref 0.0–1.2)
Total Protein: 7.2 g/dL (ref 6.5–8.1)

## 2024-09-24 LAB — CBC
HCT: 24.7 % — ABNORMAL LOW (ref 39.0–52.0)
Hemoglobin: 8.3 g/dL — ABNORMAL LOW (ref 13.0–17.0)
MCH: 28.4 pg (ref 26.0–34.0)
MCHC: 33.6 g/dL (ref 30.0–36.0)
MCV: 84.6 fL (ref 80.0–100.0)
Platelets: 71 K/uL — ABNORMAL LOW (ref 150–400)
RBC: 2.92 MIL/uL — ABNORMAL LOW (ref 4.22–5.81)
RDW: 27.9 % — ABNORMAL HIGH (ref 11.5–15.5)
WBC: 17.7 K/uL — ABNORMAL HIGH (ref 4.0–10.5)
nRBC: 0 % (ref 0.0–0.2)

## 2024-09-24 LAB — PROTIME-INR
INR: 1.6 — ABNORMAL HIGH (ref 0.8–1.2)
Prothrombin Time: 20 s — ABNORMAL HIGH (ref 11.4–15.2)

## 2024-09-24 LAB — AMMONIA: Ammonia: 77 umol/L — ABNORMAL HIGH (ref 9–35)

## 2024-09-24 LAB — LIPASE, BLOOD: Lipase: 50 U/L (ref 11–51)

## 2024-09-24 LAB — PREPARE RBC (CROSSMATCH)

## 2024-09-24 LAB — ETHANOL: Alcohol, Ethyl (B): 110 mg/dL — ABNORMAL HIGH (ref <15)

## 2024-09-24 MED ORDER — PANTOPRAZOLE SODIUM 40 MG IV SOLR
40.0000 mg | Freq: Once | INTRAVENOUS | Status: AC
  Administered 2024-09-24: 40 mg via INTRAVENOUS
  Filled 2024-09-24: qty 10

## 2024-09-24 MED ORDER — DOCUSATE SODIUM 100 MG PO CAPS: 100.0000 mg | ORAL_CAPSULE | Freq: Two times a day (BID) | ORAL | Status: DC | PRN

## 2024-09-24 MED ORDER — PANTOPRAZOLE SODIUM 40 MG IV SOLR
40.0000 mg | Freq: Two times a day (BID) | INTRAVENOUS | Status: DC
  Administered 2024-09-25 – 2024-09-27 (×5): 40 mg via INTRAVENOUS
  Filled 2024-09-24 (×5): qty 10

## 2024-09-24 MED ORDER — SODIUM CHLORIDE 0.9 % IV BOLUS
1000.0000 mL | Freq: Once | INTRAVENOUS | Status: AC
  Administered 2024-09-24: 1000 mL via INTRAVENOUS

## 2024-09-24 MED ORDER — OCTREOTIDE LOAD VIA INFUSION
50.0000 ug | Freq: Once | INTRAVENOUS | Status: AC
  Administered 2024-09-24: 50 ug via INTRAVENOUS
  Filled 2024-09-24: qty 25

## 2024-09-24 MED ORDER — CHLORHEXIDINE GLUCONATE CLOTH 2 % EX PADS
6.0000 | MEDICATED_PAD | Freq: Every day | CUTANEOUS | Status: DC
  Administered 2024-09-25 – 2024-09-27 (×3): 6 via TOPICAL

## 2024-09-24 MED ORDER — SODIUM CHLORIDE 0.9% IV SOLUTION: Freq: Once | INTRAVENOUS | Status: AC

## 2024-09-24 MED ORDER — FENTANYL CITRATE (PF) 50 MCG/ML IJ SOSY
25.0000 ug | PREFILLED_SYRINGE | INTRAMUSCULAR | Status: DC | PRN
  Administered 2024-09-25 – 2024-09-26 (×9): 25 ug via INTRAVENOUS
  Filled 2024-09-24 (×10): qty 1

## 2024-09-24 MED ORDER — DEXTROSE 50 % IV SOLN: 1.0000 | Freq: Once | INTRAVENOUS | Status: AC

## 2024-09-24 MED ORDER — THIAMINE HCL 100 MG/ML IJ SOLN
100.0000 mg | Freq: Every day | INTRAMUSCULAR | Status: DC
  Administered 2024-09-25 – 2024-09-26 (×2): 100 mg via INTRAVENOUS
  Filled 2024-09-24 (×2): qty 2

## 2024-09-24 MED ORDER — NICOTINE 21 MG/24HR TD PT24
21.0000 mg | MEDICATED_PATCH | Freq: Once | TRANSDERMAL | Status: DC
  Administered 2024-09-24: 21 mg via TRANSDERMAL
  Filled 2024-09-24: qty 1

## 2024-09-24 MED ORDER — PROMETHAZINE HCL 25 MG/ML IJ SOLN
25.0000 mg | Freq: Once | INTRAMUSCULAR | Status: AC
  Administered 2024-09-24: 25 mg via INTRAVENOUS
  Filled 2024-09-24: qty 1

## 2024-09-24 MED ORDER — DEXTROSE IN LACTATED RINGERS 5 % IV SOLN: INTRAVENOUS | Status: DC

## 2024-09-24 MED ORDER — INSULIN ASPART 100 UNIT/ML IJ SOLN
0.0000 [IU] | INTRAMUSCULAR | Status: DC
  Administered 2024-09-25: 2 [IU] via SUBCUTANEOUS
  Administered 2024-09-25: 3 [IU] via SUBCUTANEOUS
  Administered 2024-09-25: 2 [IU] via SUBCUTANEOUS
  Administered 2024-09-25 (×2): 3 [IU] via SUBCUTANEOUS
  Administered 2024-09-26: 2 [IU] via SUBCUTANEOUS

## 2024-09-24 MED ORDER — POLYETHYLENE GLYCOL 3350 17 G PO PACK: 17.0000 g | PACK | Freq: Every day | ORAL | Status: DC | PRN

## 2024-09-24 MED ORDER — IOHEXOL 350 MG/ML SOLN
75.0000 mL | Freq: Once | INTRAVENOUS | Status: AC | PRN
  Administered 2024-09-24: 75 mL via INTRAVENOUS

## 2024-09-24 MED ORDER — ONDANSETRON HCL 4 MG/2ML IJ SOLN
4.0000 mg | Freq: Once | INTRAMUSCULAR | Status: AC
Start: 2024-09-24 — End: 2024-09-24
  Administered 2024-09-24: 4 mg via INTRAVENOUS
  Filled 2024-09-24: qty 2

## 2024-09-24 MED ORDER — SODIUM CHLORIDE 0.9 % IV SOLN
1.0000 g | Freq: Once | INTRAVENOUS | Status: AC
  Administered 2024-09-24: 1 g via INTRAVENOUS
  Filled 2024-09-24: qty 10

## 2024-09-24 MED ORDER — FOLIC ACID 5 MG/ML IJ SOLN
1.0000 mg | Freq: Every day | INTRAMUSCULAR | Status: DC
  Filled 2024-09-24: qty 0.2

## 2024-09-24 MED ORDER — SODIUM CHLORIDE 0.9 % IV SOLN
50.0000 ug/h | INTRAVENOUS | Status: DC
  Administered 2024-09-24 – 2024-09-27 (×7): 50 ug/h via INTRAVENOUS
  Filled 2024-09-24 (×7): qty 1

## 2024-09-24 MED ORDER — VITAMIN K1 10 MG/ML IJ SOLN
10.0000 mg | Freq: Once | INTRAVENOUS | Status: AC
  Administered 2024-09-24: 10 mg via INTRAVENOUS
  Filled 2024-09-24: qty 1

## 2024-09-24 NOTE — ED Provider Notes (Signed)
 [place] EMERGENCY DEPARTMENT AT [facility] Provider Note   CSN: [id number] Arrival date & time: 09/24/24  2027     Patient presents with: Hematemesis and Rectal Bleeding   Shane Holmes is a 38 y.o. male.   The history is provided by the patient and medical records. No language interpreter was used.  Emesis Severity:  Severe Duration:  1 day Timing:  Constant Quality:  Bright red blood Progression:  Worsening Chronicity:  Recurrent Relieved by:  Nothing Worsened by:  Nothing Ineffective treatments:  None tried Associated symptoms: abdominal pain   Associated symptoms: no chills, no cough, no diarrhea and no URI        Prior to Admission medications   Medication Sig Start Date End Date Taking? Authorizing Provider  albuterol  (VENTOLIN  HFA) 108 (90 Base) MCG/ACT inhaler INHALE 2 PUFFS INTO THE LUNGS EVERY SIX HOURS AS NEEDED FOR WHEEZING OR SHORTNESS OF BREATH. 01/26/21 09/23/24  Ghimire, Donalda HERO, MD  amoxicillin -clavulanate (AUGMENTIN ) 875-125 MG tablet Take 1 tablet by mouth 2 (two) times daily. Patient not taking: Reported on 09/23/2024 08/23/24   Gladis Elsie BROCKS, PA-C  ascorbic acid  (VITAMIN C) 500 MG tablet Take 500 mg by mouth daily. 06/02/24   [provider]  baclofen  (LIORESAL ) 10 MG tablet Take 10 mg by mouth 2 (two) times daily. 01/20/24   [provider]  ferrous sulfate 325 (65 FE) MG tablet Take 325 mg by mouth daily. 09/08/24   [provider]  gabapentin  (NEURONTIN ) 100 MG capsule Take 100 mg tablet three times daily with 800 mg tablet three times daily for a total of 900 mg total three times daily 09/23/24   Leigh Venetia LITTIE, MD  gabapentin  (NEURONTIN ) 800 MG tablet Take 100 mg tablet three times daily with 800 mg tablet three times daily for a total of 900 mg total three times daily 09/23/24   Leigh Venetia LITTIE, MD  HYDROcodone -acetaminophen  (NORCO) 7.5-325 MG tablet Take 1 tablet by mouth 3 (three) times daily as  needed. Patient taking differently: Take 1 tablet by mouth 3 (three) times daily. 01/09/24   [provider]  loratadine (CLARITIN) 10 MG tablet Take 10 mg by mouth daily as needed. 09/06/24   [provider]  metFORMIN  (GLUCOPHAGE ) 500 MG tablet Take 1 tablet (500 mg total) by mouth 2 (two) times daily with a meal. 02/27/23 09/23/24  Leigh Venetia LITTIE, MD  nadolol  (CORGARD ) 20 MG tablet Take 1 tablet (20 mg total) by mouth daily. Patient not taking: Reported on 09/23/2024 01/29/24   Raenelle Donalda HERO, MD  pantoprazole  (PROTONIX ) 40 MG tablet TAKE 1 TABLET BY MOUTH TWICE A DAY 03/02/24   Leigh Venetia LITTIE, MD  thiamine  (VITAMIN B1) 100 MG tablet Take 1 tablet (100 mg total) by mouth daily. 01/06/23   Leigh Venetia LITTIE, MD  dicyclomine  (BENTYL ) 20 MG tablet Take 1 tablet (20 mg total) by mouth 2 (two) times daily. Patient not taking: Reported on 11/02/2019 03/02/14 11/03/19  Harris, Abigail, PA-C    Allergies: Lidocaine -glycerin    Review of Systems  Constitutional:  Positive for fatigue. Negative for chills.  HENT:  Negative for congestion.   Respiratory:  Negative for cough, chest tightness, shortness of breath and wheezing.   Cardiovascular:  Negative for chest pain.  Gastrointestinal:  Positive for abdominal pain, blood in stool, nausea and vomiting. Negative for constipation and diarrhea.  Genitourinary:  Negative for dysuria and frequency.  Musculoskeletal:  Negative for back pain.  Skin:  Negative for rash.  Neurological:  Positive for light-headedness.  Psychiatric/Behavioral:  Negative for agitation and confusion.   All other systems reviewed and are negative.   Updated Vital Signs BP 123/72   Pulse (!) 138   Temp 98.5 F (36.9 C)   Resp 18   Ht 5' 11 (1.803 m)   Wt 92 kg   SpO2 95%   BMI 28.29 kg/m   Physical Exam Vitals and nursing note reviewed.  Constitutional:      General: He is in acute distress.     Appearance: He is well-developed. He is ill-appearing.   HENT:     Head: Normocephalic and atraumatic.     Nose: No congestion or rhinorrhea.     Mouth/Throat:     Mouth: Mucous membranes are dry.     Pharynx: No oropharyngeal exudate or posterior oropharyngeal erythema.  Eyes:     Extraocular Movements: Extraocular movements intact.     Conjunctiva/sclera: Conjunctivae normal.     Pupils: Pupils are equal, round, and reactive to light.  Cardiovascular:     Rate and Rhythm: Regular rhythm. Tachycardia present.     Heart sounds: No murmur heard. Pulmonary:     Effort: Pulmonary effort is normal. No respiratory distress.     Breath sounds: Normal breath sounds. No wheezing, rhonchi or rales.  Chest:     Chest wall: No tenderness.  Abdominal:     Palpations: Abdomen is soft.     Tenderness: There is abdominal tenderness.  Musculoskeletal:        General: No swelling or tenderness.     Cervical back: Neck supple. No tenderness.  Skin:    General: Skin is warm and dry.     Capillary Refill: Capillary refill takes less than 2 seconds.     Coloration: Skin is pale.     Findings: No rash.  Neurological:     Mental Status: He is alert.     Sensory: No sensory deficit.     Motor: No weakness.  Psychiatric:        Mood and Affect: Mood normal.     (all labs ordered are listed, but only abnormal results are displayed) Labs Reviewed  COMPREHENSIVE METABOLIC PANEL WITH GFR - Abnormal; Notable for the following components:      Result Value   Chloride 93 (*)    CO2 15 (*)    Calcium  7.9 (*)    Albumin 3.1 (*)    AST 76 (*)    Total Bilirubin 4.8 (*)    Anion gap 27 (*)    All other components within normal limits  CBC - Abnormal; Notable for the following components:   WBC 17.7 (*)    RBC 2.92 (*)    Hemoglobin 8.3 (*)    HCT 24.7 (*)    RDW 27.9 (*)    Platelets 71 (*)    All other components within normal limits  PROTIME-INR - Abnormal; Notable for the following components:   Prothrombin Time 20.0 (*)    INR 1.6 (*)    All  other components within normal limits  AMMONIA - Abnormal; Notable for the following components:   Ammonia 77 (*)    All other components within normal limits  ETHANOL - Abnormal; Notable for the following components:   Alcohol, Ethyl (B) 110 (*)    All other components within normal limits  LIPASE, BLOOD  I-STAT CG4 LACTIC ACID, ED  POC OCCULT BLOOD, ED  TYPE AND SCREEN  PREPARE RBC (CROSSMATCH)    EKG: EKG Interpretation Date/Time:  Friday September 24 2024 21:41:24 EDT Ventricular Rate:  133 PR Interval:  142 QRS Duration:  87 QT Interval:  302 QTC Calculation: 450 R Axis:   39  Text Interpretation: Sinus tachycardia Probable left atrial enlargement RSR' in V1 or V2, probably normal variant Baseline wander in lead(s) I II III aVL aVF V2 V4 V5 when compared to prior, similar appearance No STEMI Confirmed by Ginger Barefoot ([id number]) on 09/24/2024 9:45:15 PM  Radiology: No results found.   Procedures   CRITICAL CARE Performed by: Lonni PARAS Jontavia Leatherbury Total critical care time: 45 minutes Critical care time was exclusive of separately billable procedures and treating other patients. Critical care was necessary to treat or prevent imminent or life-threatening deterioration. Critical care was time spent personally by me on the following activities: development of treatment plan with patient and/or surrogate as well as nursing, discussions with consultants, evaluation of patient's response to treatment, examination of patient, obtaining history from patient or surrogate, ordering and performing treatments and interventions, ordering and review of laboratory studies, ordering and review of radiographic studies, pulse oximetry and re-evaluation of patient's condition.   Medications Ordered in the ED  octreotide  (SANDOSTATIN ) 2 mcg/mL load via infusion 50 mcg (50 mcg Intravenous Bolus from Bag 09/24/24 2153)    And  octreotide  (SANDOSTATIN ) 500 mcg in sodium chloride  0.9 % 250 mL (2  mcg/mL) infusion (50 mcg/hr Intravenous New Bag/Given 09/24/24 2158)  nicotine  (NICODERM CQ  - dosed in mg/24 hours) patch 21 mg (21 mg Transdermal Patch Applied 09/24/24 2150)  phytonadione (VITAMIN K) 10 mg in dextrose  5 % 50 mL IVPB (10 mg Intravenous New Bag/Given 09/24/24 2225)  cefTRIAXone  (ROCEPHIN ) 1 g in sodium chloride  0.9 % 100 mL IVPB (has no administration in time range)  0.9 %  sodium chloride  infusion (Manually program via Guardrails IV Fluids) (has no administration in time range)  sodium chloride  0.9 % bolus 1,000 mL (1,000 mLs Intravenous New Bag/Given 09/24/24 2144)  ondansetron  (ZOFRAN ) injection 4 mg (4 mg Intravenous Given 09/24/24 2146)  pantoprazole  (PROTONIX ) injection 40 mg (40 mg Intravenous Given 09/24/24 2146)  promethazine (PHENERGAN) 25 mg in sodium chloride  0.9 % 50 mL IVPB (25 mg Intravenous New Bag/Given 09/24/24 2214)                                    Medical Decision Making Amount and/or Complexity of Data Reviewed Labs: ordered.      CHRISHAUN SASSO is a 38 y.o. male with a past medical history significant for diabetes, documented alcohol abuse, previous upper GI bleed and documented esophageal varices, alcohol cirrhosis, asthma, and neuropathies who presents with abdominal pain and hematemesis.  According to patient and family, he has been feeling fairly well recently but this morning started having severe abdominal pain with nausea and vomiting.  He reports he was vomiting up a large amount of bright blood that shot out of him.  He also said he started having dark stools and was concerned about bleeding.  He reports he has had this in the past and had to get endoscopies.  He normally gets his GI care with [facility] and has not seen [facility] teams in the past.  Patient reports he is felt fatigued and lightheaded and on arrival his blood pressures in the 140s.  He is denying any chest pain or shortness  of breath but reports this pain and pressure  in his abdomen.  He denies any urinary changes and denies fevers or chills.  He reports some lightheadedness.  No cough.  No recent trauma.  He has been taking his medications he reports.  He denies alcohol use in the last 24 hours but reports his last drink was 2 days ago.  On my exam he is extremely ill-appearing.  He is slightly diaphoretic and holding his abdomen.  He is vomiting blood in the room.  He is tachycardic, tachypneic, and lightheaded.  Pupils are symmetric and reactive and he has dry mouth aside from the emesis.  Chest is nontender and he does not have murmur.  Abdomen is diffusely tender.  Flanks and back nontender.  No focal neurologic deficits initially.  Clinically I am concerned about upper GI bleed recurrence and given his history of varices we will start management.  I ordered octreotide , Protonix , and ordered fluids.  A type and screen was ordered as was other blood work.  I placed a call to GI and due to his blood pressure being 83, I called critical care as well.  Spoke to Dr. Arville Roddy with GI who requested him getting antibiotics, IV vitamin K, and they will see him.  He requested critical care admit.  Critical care will see for admission.  They requested 1 unit of blood although his hemoglobin is 8.3 as we anticipate will continue to trend down given his ongoing vomiting blood.  Critical care will admit for further management.  A CT GI bleed study was ordered which is in process.  Anticipate critical care following up on the other labs and imaging and GI will be involved.  Critical care will admit.            Final diagnoses:  Hematemesis with nausea  Acute GI bleeding  Abdominal pain, unspecified abdominal location  Hypotension, unspecified hypotension type     Clinical Impression: 1. Hematemesis with nausea   2. Acute GI bleeding   3. Abdominal pain, unspecified abdominal location   4. Hypotension, unspecified hypotension type     Disposition:  Admit  This note was prepared with assistance of Dragon voice recognition software. Occasional wrong-word or sound-a-like substitutions may have occurred due to the inherent limitations of voice recognition software.      Medora Roorda, Lonni PARAS, MD 09/24/24 [phone]

## 2024-09-24 NOTE — ED Notes (Signed)
 Pt also states has had 2 stools tonight that were dark.

## 2024-09-24 NOTE — ED Triage Notes (Signed)
 Pt arrived from home via POV c/o hematemesis since midnight last night. Pt states that it is dark red blood. Pt states that he is feeling weak at present.

## 2024-09-24 NOTE — H&P (Signed)
 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  He  also c/o weakness.  Says he has a stomach ulcer, does not know about esophageal varices.  Says he has cut down on his drinking a lot this year.  He says he is not drinking hard liquor anymore, only beer. In ED his BP was relatively low and received 1 liter fluid bolus, Octreotide  started and Vitamin K, and to get 1 unit PRBC.  GI has been consulted. AG 27, WBC 17.7, HgB 8.3, PLT 71, INR 1.6, ETOH level 110. CT abd showing:  Negative for acute aortic dissection or aneurysm. No evidence for intraluminal extravasation of contrast to suggest active GI bleeding on this exam. 2. Liver cirrhosis with evidence of portal hypertension including splenomegaly and small gastroesophageal varices. 3. Multiple hepatic nodules, better evaluated on recent MRI. 4. Mild fat stranding about the pancreas and second and third portion of duodenum, question mild pancreatitis or duodenitis. 5. Cholelithiasis and sludge within the gallbladder without right upper quadrant inflammation. 6. Slightly thick-walled urinary bladder, but decompressed. Question cystitis. 7. Aortic atherosclerosis.  Aortic Atherosclerosis (ICD10-I70.0).   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 rate 18, height 5' 11 (1.803 m), weight 92 kg, SpO2 100%.       No intake or output data in the 24 hours ending 09/24/24 2307 Filed Weights   09/24/24 2101  Weight: 92 kg    Examination: General: alert and awake NAD HENT: PERRLA no icterus eomi Lungs: CTA no wheezes no rales Cardiovascular: reg s1s2 no murmurs Abdomen: soft, obese, nt nd bs pos, no guarding Extremities: no cyanosis, clubbing or edema Neuro: aaox3, cn II to XII grossly intact GU: n/a  Resolved problem list   Assessment and Plan  Acute upper GI bleed ABLA Melanotic stools from upper GI bleed Hypotension secondary to hypovolemia and blood loss Liver cirrhosis ETOH abuse Portal HTN AGMA H/o Pre-DM Thiamine  deficiency with peripheral neuropathy  Plan for Octreotide  drip, Vit K, Thiamine  and FA, Protonix , ICU admission, IV fluids as needed, f/u post transfusion H/H and q 6 H/H GI bleed from PUD vs varices Increased AG likely form ketosis   Labs   CBC: Recent Labs  Lab 09/24/24 2126  WBC 17.7*  HGB 8.3*  HCT 24.7*  MCV 84.6  PLT 71*    Basic Metabolic Panel: Recent Labs  Lab 09/24/24 2126  NA 135  K 3.5  CL 93*  CO2 15*  GLUCOSE 74  BUN 11  CREATININE 0.79  CALCIUM  7.9*   GFR: Estimated Creatinine Clearance: 143.8 mL/min (by C-G formula based on SCr of 0.79 mg/dL). Recent Labs  Lab 09/24/24 2126  WBC 17.7*    Liver Function Tests: Recent Labs  Lab 09/24/24 2126  AST 76*  ALT 14  ALKPHOS 97  BILITOT 4.8*  PROT 7.2  ALBUMIN 3.1*   Recent Labs  Lab 09/24/24 2132  LIPASE 50   Recent Labs  Lab 09/24/24 2132  AMMONIA 77*    ABG    Component Value Date/Time   TCO2 19 (L) 12/18/2022 1545     Coagulation Profile: Recent Labs  Lab 09/24/24 2132  INR 1.6*    Cardiac Enzymes: No results for input(s): CKTOTAL, CKMB, CKMBINDEX, TROPONINI in the last 168 hours.  HbA1C: Hgb A1c MFr  Bld  Date/Time Value Ref Range Status  01/28/2024 02:30 PM 4.9 4.8 - 5.6 % Final    Comment:    (NOTE) Pre diabetes:          5.7%-6.4%  Diabetes:              >6.4%  Glycemic control for   <7.0% adults with diabetes   06/19/2022 06:04 AM 7.4 (H) 4.8 - 5.6 % Final    Comment:    (NOTE) Pre diabetes:          5.7%-6.4%  Diabetes:              >6.4%  Glycemic control for   <7.0% adults with diabetes     CBG: No results for input(s): GLUCAP in the last 168 hours.  Review of Systems:   N/V, dark stools x 2, mild abd pain.  Decrease ETOH intake  Past Medical History:  He,  has a past medical history of Alcohol abuse, Asthma, Back pain, Bulging lumbar disc, COVID, Diabetes mellitus without complication (HCC), Encounter for lumbar puncture, Hematochezia, Hypokalemia, Hyponatremia, Lower GI bleed, and Neuropathy.   Surgical History:   Past Surgical History:  Procedure Laterality Date   BIOPSY  06/19/2022   Procedure: BIOPSY;  Surgeon: Legrand Victory LITTIE DOUGLAS, MD;  Location: [facility] ENDOSCOPY;  Service: Gastroenterology;;   BIOPSY  01/27/2024   Procedure: BIOPSY;  Surgeon: Legrand Victory LITTIE DOUGLAS, MD;  Location: [facility] ENDOSCOPY;  Service: Gastroenterology;;   ESOPHAGOGASTRODUODENOSCOPY (EGD) WITH PROPOFOL  N/A 06/19/2022   Procedure: ESOPHAGOGASTRODUODENOSCOPY (EGD) WITH PROPOFOL ;  Surgeon: Legrand Victory LITTIE DOUGLAS, MD;  Location[facility] ENDOSCOPY;  Service: Gastroenterology;  Laterality: N/A;   ESOPHAGOGASTRODUODENOSCOPY (EGD) WITH PROPOFOL  N/A 01/27/2024   Procedure: ESOPHAGOGASTRODUODENOSCOPY (EGD) WITH PROPOFOL ;  Surgeon: Legrand Victory LITTIE DOUGLAS, MD;  Location: [facility] ENDOSCOPY;  Service: Gastroenterology;  Laterality: N/A;     Social History:   reports that he has been smoking cigarettes. He has never used smokeless tobacco. He reports current alcohol use of about 6.0 standard drinks of alcohol per week. He reports current drug use.   Family History:  His family history includes Cancer in his maternal  grandfather and paternal grandfather; Cirrhosis in his father and mother; Diabetes in his maternal aunt. There is no history of Hypertension.   Allergies Allergies  Allergen Reactions   Lidocaine -Glycerin Other (See Comments)    Redness to application site     Home Medications  Prior to Admission medications   Medication Sig Start Date End Date Taking? Authorizing Provider  albuterol  (VENTOLIN  HFA) 108 (90 Base) MCG/ACT inhaler INHALE 2 PUFFS INTO THE LUNGS EVERY SIX HOURS AS NEEDED FOR WHEEZING OR SHORTNESS OF BREATH. 01/26/21 09/23/24  Ghimire, Donalda HERO, MD  amoxicillin -clavulanate (AUGMENTIN ) 875-125 MG tablet Take 1 tablet by mouth 2 (two) times daily. Patient not taking: Reported on 09/23/2024 08/23/24   Gladis Elsie BROCKS, PA-C  ascorbic acid  (VITAMIN C) 500 MG tablet Take 500 mg by mouth daily. 06/02/24  [provider]  baclofen  (LIORESAL ) 10 MG tablet Take 10 mg by mouth 2 (two) times daily. 01/20/24   [provider]  ferrous sulfate 325 (65 FE) MG tablet Take 325 mg by mouth daily. 09/08/24   [provider]  gabapentin  (NEURONTIN ) 100 MG capsule Take 100 mg tablet three times daily with 800 mg tablet three times daily for a total of 900 mg total three times daily 09/23/24   Leigh Venetia CROME, MD  gabapentin  (NEURONTIN ) 800 MG tablet Take 100 mg tablet three times daily with 800 mg tablet three times daily for a total of 900 mg total three times daily 09/23/24   Leigh Venetia CROME, MD  HYDROcodone -acetaminophen  (NORCO) 7.5-325 MG tablet Take 1 tablet by mouth 3 (three) times daily as needed. Patient taking differently: Take 1 tablet by mouth 3 (three) times daily. 01/09/24   [provider]  loratadine (CLARITIN) 10 MG tablet Take 10 mg by mouth daily as needed. 09/06/24   [provider]  metFORMIN  (GLUCOPHAGE ) 500 MG tablet Take 1 tablet (500 mg total) by mouth 2 (two) times daily with a meal. 02/27/23 09/23/24  Leigh Venetia CROME, MD  nadolol  (CORGARD )  20 MG tablet Take 1 tablet (20 mg total) by mouth daily. Patient not taking: Reported on 09/23/2024 01/29/24   Raenelle Donalda HERO, MD  pantoprazole  (PROTONIX ) 40 MG tablet TAKE 1 TABLET BY MOUTH TWICE A DAY 03/02/24   Leigh Venetia CROME, MD  thiamine  (VITAMIN B1) 100 MG tablet Take 1 tablet (100 mg total) by mouth daily. 01/06/23   Leigh Venetia CROME, MD  dicyclomine  (BENTYL ) 20 MG tablet Take 1 tablet (20 mg total) by mouth 2 (two) times daily. Patient not taking: Reported on 11/02/2019 03/02/14 11/03/19  Arloa Chroman, PA-C     Critical care time: 66   The patient is critically ill with multiple organ system failure and requires high complexity decision making for assessment and support, frequent evaluation and titration of therapies, advanced monitoring, review of radiographic studies and interpretation of complex data.   Critical Care Time devoted to patient care services, exclusive of separately billable procedures, described in this note is 32 minutes.   Orlin Fairly, MD [facility] See Amion for pager  If no response to pager , please call [phone] until 7pm After 7:00 pm call Elink  [phone] 09/24/2024, 11:14 PM

## 2024-09-25 ENCOUNTER — Inpatient Hospital Stay (HOSPITAL_COMMUNITY): Admitting: Anesthesiology

## 2024-09-25 ENCOUNTER — Encounter (HOSPITAL_COMMUNITY): Admission: EM | Disposition: A | Payer: Self-pay | Source: Home / Self Care

## 2024-09-25 DIAGNOSIS — K7460 Unspecified cirrhosis of liver: Secondary | ICD-10-CM

## 2024-09-25 DIAGNOSIS — K3189 Other diseases of stomach and duodenum: Secondary | ICD-10-CM

## 2024-09-25 DIAGNOSIS — I8500 Esophageal varices without bleeding: Secondary | ICD-10-CM

## 2024-09-25 DIAGNOSIS — D62 Acute posthemorrhagic anemia: Secondary | ICD-10-CM

## 2024-09-25 DIAGNOSIS — F1010 Alcohol abuse, uncomplicated: Secondary | ICD-10-CM

## 2024-09-25 DIAGNOSIS — I8511 Secondary esophageal varices with bleeding: Secondary | ICD-10-CM

## 2024-09-25 DIAGNOSIS — K766 Portal hypertension: Secondary | ICD-10-CM

## 2024-09-25 DIAGNOSIS — K259 Gastric ulcer, unspecified as acute or chronic, without hemorrhage or perforation: Secondary | ICD-10-CM

## 2024-09-25 DIAGNOSIS — D696 Thrombocytopenia, unspecified: Secondary | ICD-10-CM

## 2024-09-25 DIAGNOSIS — K920 Hematemesis: Secondary | ICD-10-CM

## 2024-09-25 DIAGNOSIS — D689 Coagulation defect, unspecified: Secondary | ICD-10-CM

## 2024-09-25 DIAGNOSIS — K921 Melena: Secondary | ICD-10-CM

## 2024-09-25 HISTORY — PX: ESOPHAGOGASTRODUODENOSCOPY: SHX5428

## 2024-09-25 LAB — TYPE AND SCREEN
ABO/RH(D): O POS
Antibody Screen: NEGATIVE
Unit division: 0

## 2024-09-25 LAB — CBC
HCT: 20.8 % — ABNORMAL LOW (ref 39.0–52.0)
HCT: 22 % — ABNORMAL LOW (ref 39.0–52.0)
HCT: 22.9 % — ABNORMAL LOW (ref 39.0–52.0)
HCT: 23.9 % — ABNORMAL LOW (ref 39.0–52.0)
HCT: 24.3 % — ABNORMAL LOW (ref 39.0–52.0)
Hemoglobin: 7 g/dL — ABNORMAL LOW (ref 13.0–17.0)
Hemoglobin: 7.5 g/dL — ABNORMAL LOW (ref 13.0–17.0)
Hemoglobin: 7.8 g/dL — ABNORMAL LOW (ref 13.0–17.0)
Hemoglobin: 8 g/dL — ABNORMAL LOW (ref 13.0–17.0)
Hemoglobin: 8.1 g/dL — ABNORMAL LOW (ref 13.0–17.0)
MCH: 28.8 pg (ref 26.0–34.0)
MCH: 28.9 pg (ref 26.0–34.0)
MCH: 29 pg (ref 26.0–34.0)
MCH: 29.5 pg (ref 26.0–34.0)
MCH: 29.8 pg (ref 26.0–34.0)
MCHC: 32.9 g/dL (ref 30.0–36.0)
MCHC: 33.7 g/dL (ref 30.0–36.0)
MCHC: 33.9 g/dL (ref 30.0–36.0)
MCHC: 34.1 g/dL (ref 30.0–36.0)
MCHC: 34.1 g/dL (ref 30.0–36.0)
MCV: 84.9 fL (ref 80.0–100.0)
MCV: 86 fL (ref 80.0–100.0)
MCV: 86.9 fL (ref 80.0–100.0)
MCV: 87.4 fL (ref 80.0–100.0)
MCV: 87.4 fL (ref 80.0–100.0)
Platelets: 44 K/uL — ABNORMAL LOW (ref 150–400)
Platelets: 46 K/uL — ABNORMAL LOW (ref 150–400)
Platelets: 50 K/uL — ABNORMAL LOW (ref 150–400)
Platelets: 50 K/uL — ABNORMAL LOW (ref 150–400)
Platelets: 55 K/uL — ABNORMAL LOW (ref 150–400)
RBC: 2.42 MIL/uL — ABNORMAL LOW (ref 4.22–5.81)
RBC: 2.59 MIL/uL — ABNORMAL LOW (ref 4.22–5.81)
RBC: 2.62 MIL/uL — ABNORMAL LOW (ref 4.22–5.81)
RBC: 2.75 MIL/uL — ABNORMAL LOW (ref 4.22–5.81)
RBC: 2.78 MIL/uL — ABNORMAL LOW (ref 4.22–5.81)
RDW: 24.8 % — ABNORMAL HIGH (ref 11.5–15.5)
RDW: 25.2 % — ABNORMAL HIGH (ref 11.5–15.5)
RDW: 25.2 % — ABNORMAL HIGH (ref 11.5–15.5)
RDW: 25.6 % — ABNORMAL HIGH (ref 11.5–15.5)
RDW: 26.7 % — ABNORMAL HIGH (ref 11.5–15.5)
WBC: 10.2 K/uL (ref 4.0–10.5)
WBC: 12.1 K/uL — ABNORMAL HIGH (ref 4.0–10.5)
WBC: 7.1 K/uL (ref 4.0–10.5)
WBC: 7.2 K/uL (ref 4.0–10.5)
WBC: 9 K/uL (ref 4.0–10.5)
nRBC: 0 % (ref 0.0–0.2)
nRBC: 0 % (ref 0.0–0.2)
nRBC: 0 % (ref 0.0–0.2)
nRBC: 0 % (ref 0.0–0.2)
nRBC: 0 % (ref 0.0–0.2)

## 2024-09-25 LAB — BPAM RBC
Blood Product Expiration Date: 202511182359
ISSUE DATE / TIME: 202510242329
Unit Type and Rh: 5100

## 2024-09-25 LAB — GLUCOSE, CAPILLARY
Glucose-Capillary: 159 mg/dL — ABNORMAL HIGH (ref 70–99)
Glucose-Capillary: 165 mg/dL — ABNORMAL HIGH (ref 70–99)
Glucose-Capillary: 135 mg/dL — ABNORMAL HIGH (ref 70–99)
Glucose-Capillary: 171 mg/dL — ABNORMAL HIGH (ref 70–99)
Glucose-Capillary: 141 mg/dL — ABNORMAL HIGH (ref 70–99)
Glucose-Capillary: 204 mg/dL — ABNORMAL HIGH (ref 70–99)

## 2024-09-25 LAB — LACTIC ACID, PLASMA: Lactic Acid, Venous: 4.5 mmol/L (ref 0.5–1.9)

## 2024-09-25 LAB — HEMOGLOBIN A1C
Hgb A1c MFr Bld: 5 % (ref 4.8–5.6)
Mean Plasma Glucose: 96.8 mg/dL

## 2024-09-25 LAB — I-STAT CG4 LACTIC ACID, ED: Lactic Acid, Venous: 13.1 mmol/L (ref 0.5–1.9)

## 2024-09-25 LAB — CBG MONITORING, ED
Glucose-Capillary: 54 mg/dL — ABNORMAL LOW (ref 70–99)
Glucose-Capillary: 95 mg/dL (ref 70–99)

## 2024-09-25 SURGERY — EGD (ESOPHAGOGASTRODUODENOSCOPY)
Anesthesia: Monitor Anesthesia Care

## 2024-09-25 MED ORDER — PROPOFOL 1000 MG/100ML IV EMUL
0.0000 ug/kg/min | INTRAVENOUS | Status: DC
  Administered 2024-09-25: 30 ug/kg/min via INTRAVENOUS
  Filled 2024-09-25: qty 100

## 2024-09-25 MED ORDER — NICOTINE 21 MG/24HR TD PT24
21.0000 mg | MEDICATED_PATCH | Freq: Every day | TRANSDERMAL | Status: DC
  Administered 2024-09-25 – 2024-09-27 (×3): 21 mg via TRANSDERMAL
  Filled 2024-09-25 (×3): qty 1

## 2024-09-25 MED ORDER — MIDAZOLAM HCL 2 MG/2ML IJ SOLN
INTRAMUSCULAR | Status: AC
  Administered 2024-09-25: 2 mg
  Filled 2024-09-25: qty 2

## 2024-09-25 MED ORDER — SUCCINYLCHOLINE CHLORIDE 200 MG/10ML IV SOSY
PREFILLED_SYRINGE | INTRAVENOUS | Status: AC
  Filled 2024-09-25: qty 10

## 2024-09-25 MED ORDER — FENTANYL BOLUS VIA INFUSION: 25.0000 ug | INTRAVENOUS | Status: DC | PRN

## 2024-09-25 MED ORDER — MIDAZOLAM HCL (PF) 2 MG/2ML IJ SOLN
4.0000 mg | Freq: Once | INTRAMUSCULAR | Status: AC
  Administered 2024-09-25: 2 mg via INTRAVENOUS
  Filled 2024-09-25: qty 4

## 2024-09-25 MED ORDER — ETOMIDATE 2 MG/ML IV SOLN
INTRAVENOUS | Status: AC
  Administered 2024-09-25: 20 mg
  Filled 2024-09-25: qty 20

## 2024-09-25 MED ORDER — PROPOFOL 10 MG/ML IV BOLUS
50.0000 mg | Freq: Once | INTRAVENOUS | Status: AC
  Administered 2024-09-25: 50 mg via INTRAVENOUS

## 2024-09-25 MED ORDER — POLYETHYLENE GLYCOL 3350 17 G PO PACK: 17.0000 g | PACK | Freq: Every day | ORAL | Status: DC

## 2024-09-25 MED ORDER — IPRATROPIUM-ALBUTEROL 0.5-2.5 (3) MG/3ML IN SOLN
RESPIRATORY_TRACT | Status: AC
  Administered 2024-09-25: 3 mL
  Filled 2024-09-25: qty 3

## 2024-09-25 MED ORDER — DOCUSATE SODIUM 50 MG/5ML PO LIQD
100.0000 mg | Freq: Two times a day (BID) | ORAL | Status: DC
  Filled 2024-09-25: qty 10

## 2024-09-25 MED ORDER — POTASSIUM CHLORIDE 20 MEQ PO PACK
40.0000 meq | PACK | Freq: Once | ORAL | Status: AC
  Administered 2024-09-25: 40 meq
  Filled 2024-09-25: qty 2

## 2024-09-25 MED ORDER — METOCLOPRAMIDE HCL 5 MG/ML IJ SOLN
10.0000 mg | Freq: Once | INTRAMUSCULAR | Status: AC
  Administered 2024-09-25: 10 mg via INTRAVENOUS
  Filled 2024-09-25: qty 2

## 2024-09-25 MED ORDER — FENTANYL 2500MCG IN NS 250ML (10MCG/ML) PREMIX INFUSION
0.0000 ug/h | INTRAVENOUS | Status: DC
  Administered 2024-09-25: 100 ug/h via INTRAVENOUS
  Filled 2024-09-25: qty 250

## 2024-09-25 MED ORDER — BACLOFEN 10 MG PO TABS
10.0000 mg | ORAL_TABLET | Freq: Two times a day (BID) | ORAL | Status: DC
  Administered 2024-09-25 – 2024-09-27 (×4): 10 mg via ORAL
  Filled 2024-09-25 (×4): qty 1

## 2024-09-25 MED ORDER — SODIUM CHLORIDE 0.9 % IV SOLN
2.0000 g | INTRAVENOUS | Status: DC
  Administered 2024-09-25: 2 g via INTRAVENOUS
  Filled 2024-09-25: qty 20

## 2024-09-25 MED ORDER — HYDROCODONE-ACETAMINOPHEN 7.5-325 MG PO TABS
1.0000 | ORAL_TABLET | Freq: Three times a day (TID) | ORAL | Status: DC | PRN
  Administered 2024-09-25 – 2024-09-26 (×2): 1 via ORAL
  Filled 2024-09-25 (×2): qty 1

## 2024-09-25 MED ORDER — FENTANYL CITRATE (PF) 50 MCG/ML IJ SOSY: 25.0000 ug | PREFILLED_SYRINGE | Freq: Once | INTRAMUSCULAR | Status: DC

## 2024-09-25 MED ORDER — SODIUM CHLORIDE 0.9 % IV BOLUS
1000.0000 mL | Freq: Once | INTRAVENOUS | Status: AC
  Administered 2024-09-25: 1000 mL via INTRAVENOUS

## 2024-09-25 MED ORDER — MIDAZOLAM HCL 2 MG/2ML IJ SOLN
INTRAMUSCULAR | Status: AC
Start: 2024-09-25 — End: 2024-09-25
  Administered 2024-09-25: 2 mg
  Filled 2024-09-25: qty 2

## 2024-09-25 MED ORDER — FENTANYL CITRATE (PF) 50 MCG/ML IJ SOSY
100.0000 ug | PREFILLED_SYRINGE | Freq: Once | INTRAMUSCULAR | Status: AC
  Administered 2024-09-25: 100 ug via INTRAVENOUS

## 2024-09-25 MED ORDER — ROCURONIUM BROMIDE 10 MG/ML (PF) SYRINGE
PREFILLED_SYRINGE | INTRAVENOUS | Status: AC
Start: 2024-09-25 — End: 2024-09-25
  Administered 2024-09-25: 100 mg
  Filled 2024-09-25: qty 10

## 2024-09-25 MED ORDER — FOLIC ACID 5 MG/ML IJ SOLN
1.0000 mg | Freq: Every day | INTRAMUSCULAR | Status: DC
  Filled 2024-09-25 (×2): qty 0.2

## 2024-09-25 MED ORDER — ONDANSETRON HCL 4 MG/2ML IJ SOLN
4.0000 mg | Freq: Four times a day (QID) | INTRAMUSCULAR | Status: DC | PRN
  Administered 2024-09-25 – 2024-09-26 (×2): 4 mg via INTRAVENOUS
  Filled 2024-09-25 (×2): qty 2

## 2024-09-25 MED ORDER — DOCUSATE SODIUM 100 MG PO CAPS: 100.0000 mg | ORAL_CAPSULE | Freq: Two times a day (BID) | ORAL | Status: DC

## 2024-09-25 MED ORDER — DEXTROSE 50 % IV SOLN
INTRAVENOUS | Status: AC
  Administered 2024-09-25: 50 mL via INTRAVENOUS
  Filled 2024-09-25: qty 50

## 2024-09-25 MED ORDER — FENTANYL CITRATE (PF) 50 MCG/ML IJ SOSY
PREFILLED_SYRINGE | INTRAMUSCULAR | Status: AC
  Filled 2024-09-25: qty 2

## 2024-09-25 MED ORDER — GABAPENTIN 400 MG PO CAPS
800.0000 mg | ORAL_CAPSULE | Freq: Three times a day (TID) | ORAL | Status: DC
  Administered 2024-09-25 – 2024-09-27 (×5): 800 mg via ORAL
  Filled 2024-09-25 (×5): qty 2

## 2024-09-25 NOTE — Progress Notes (Addendum)
 eLink Physician-Brief Progress Note Patient Name: Shane Holmes DOB: 10-07-1986 MRN: [id number]   Date of Service  09/25/2024  HPI/Events of Note  Pt requesting some home meds to be restarted.    eICU Interventions  Placed order for baclofen  and norco PRN.  Nicotine  patch ordered as pt is craving cigarettes        Najat Olazabal M DELA CRUZ 09/25/2024, 7:56 PM  9:04 PM Glucose 141.  Held D5LR.  Will continue to follow glucose trends.

## 2024-09-25 NOTE — Evaluation (Signed)
 RT Evaluate and Treat Note  09/25/2024   Breathing is (select one): Worse than normal   The following was found on auscultation (select multiple):                Cough Assessment:      Most Recent Chest Xray:... (No results found.  Narrative & Impression  CLINICAL DATA:  Chest pain, tachycardia, hematemesis   EXAM: CHEST - 2 VIEW   COMPARISON:  10/21/2022   FINDINGS: Lungs are clear.  No pleural effusion or pneumothorax.   The heart is normal in size.   Visualized osseous structures are within normal limits.   IMPRESSION: Normal chest radiographs.     The following medications and/or interventions were ordered/changed/discontinued as part of the Respiratory Treatment protocol:   Medication Changes: Albuterol  Ordered   Airway Clearance Changes: none   Oxygen Therapy Changes:vent

## 2024-09-25 NOTE — Procedures (Signed)
 Intubation Procedure Note  JHORDAN MCKIBBEN  [id number]  15-Oct-1986  Date:09/25/24  Time:3:21 PM   Provider Performing:Dane Kopke C Claudene    Procedure: Intubation (31500)  Indication(s) Respiratory Failure  Consent Risks of the procedure as well as the alternatives and risks of each were explained to the patient and/or caregiver.  Consent for the procedure was obtained and is signed in the bedside chart   Anesthesia Etomidate, Versed, Fentanyl , and Rocuronium   Time Out Verified patient identification, verified procedure, site/side was marked, verified correct patient position, special equipment/implants available, medications/allergies/relevant history reviewed, required imaging and test results available.   Sterile Technique Usual hand hygeine, masks, and gloves were used   Procedure Description Patient positioned in bed supine.  Sedation given as noted above.  Patient was intubated with endotracheal tube using Glidescope.  View was Grade 1 full glottis .  Number of attempts was 1.  Colorimetric CO2 detector was consistent with tracheal placement.   Complications/Tolerance Bronchospasm after needing bagging and nebulizer Chest X-ray is ordered to verify placement.   EBL Minimal   Specimen(s) None

## 2024-09-25 NOTE — Interval H&P Note (Signed)
 History and Physical Interval Note:  09/25/2024 1:57 PM  Shane Holmes Mania  has presented today for surgery, with the diagnosis of Hematemesis, Cirrhosis, Varices.  The various methods of treatment have been discussed with the patient and family. After consideration of risks, benefits and other options for treatment, the patient has consented to  Procedure(s): EGD (ESOPHAGOGASTRODUODENOSCOPY) (N/A) as a surgical intervention.  [name] history has been reviewed, patient examined, no change in status, stable for surgery.  I have reviewed the patient's chart and labs.  Questions were answered to the patient's satisfaction.     [name]

## 2024-09-25 NOTE — H&P (View-Only) (Signed)
 Referring Provider: Dr. Lonni Tegeler Primary Care Physician:  Claudene Jackee DASEN, PA-C Primary Gastroenterologist:  Sampson, seen by [facility] as an inpatient 2023 and 01/2024  Reason for Consultation:  UGI bleed, hematemesis and dark stools  HPI: Shane Holmes is a 38 y.o. male with a past medical history of asthma, DM type II, peripheral neuropathy, chronic back pain, thiamine  deficiency, esophageal ulcer, duodenal ulcers, alcohol use disorder with cirrhosis, grade 2 esophageal varices with GI bleed 06/20/2022 and 01/22/2024, portal hypertensive gastropathy, thrombocytopenia and anemia.  He presented to the ED 09/24/2024 with hematemesis and dark stools. Labs in the ED showed a WBC count of 17.7.  Hemoglobin 8.3 (down from 11.6 on 01/29/2024).  Hematocrit 24.7.  Platelets 71.  Sodium 135.  Potassium 3.5.  BUN 11.  Creatinine 0.79.  Albumin 3.6.  Total bili 4.8.  Alk phos 97.  AST 76.  ALT 14.  Anion gap 27.  Lipase 50.  INR 1.6.  Ammonia 77.  Ethyl alcohol 110.  CTA was negative for active GI bleeding and showed cirrhosis with portal hypertension, splenomegaly, small gastroesophageal varices, multiple hepatic nodules, mild fat stranding to the pancreas and 2nd/3rd portion of the duodenum, cholelithiasis with sludge within the gallbladder, slightly thick-walled urinary bladder and aortic atherosclerosis.  He received IV fluids and was started on Octreotide  and PPI IV.  A GI consult was requested for further management of upper GI bleed.  Labs today: WBC 12.1.  Hemoglobin 8.0.  Platelets 55.  Lactic acid 13.1.  Repeat lactic acid 4.5.  Transfused 1 unit of PRBCs.  Posttransfusion H&H 8.1 at 3:53 AM.  Received Reglan  10 mg IV x 1.  He endorsed feeling quite well on Thursday 09/2022.  He awakened shortly after midnight Friday 10/24 with hematemesis.  He vomited dark red blood at least 10 times and noted passing a few black stools.  While in the ED he had emesis x 2 and passed 2 black stools.   No further hematemesis or melena overnight or thus far this morning.  He denied having any abdominal pain prior to the onset of vomiting and now endorses having generalized abdominal soreness.  No NSAID use.  He takes Pantoprazole  40 mg twice daily at home.  He previously drink heavily on a daily basis, however, for the past 1-1/2 years he decreased his alcohol intake.  He sporadically drinks a malt liquor beverage once every 2 weeks.  Last alcohol intake was 5 days ago.  Denies having confusion.  Previously hospitalized secondary to upper GI bleed 06/20/2022 and 01/22/2024.  EGD 06/19/2022 identified an esophageal ulcer without stigmata of recent bleeding, portal hypertensive gastropathy erosive gastropathy, nonbleeding duodenal ulcers without stigmata of bleeding.  Path report was negative for H. pylori.  EGD 01/27/2024 identified grade 2 esophageal varices, portal hypertensive gastropathy and a 3 cm hiatal hernia.  At that time, it was felt bleeding was self-limited secondary to gastric and/or esophageal mucosal bleeding in the setting of severe portal hypertensive changes and esophageal varices possibly extending into the upper gastric folds.  No definitive stigmata of esophageal variceal bleeding was identified.  Patient previously prescribed Nadolol , not taking it.  Mother and father with history of cirrhosis.  He has chronic back pain and chronic neuropathy associated pain for which he takes codon 3 times daily per pain management.  GI PROCEDURES:  EGD as an inpatient secondary to UGI bleed (hematemesis) by Dr. Legrand 01/27/2024. - Salmon-colored mucosa suspicious for long-segment Barrett's esophagus. Biopsied.  - Grade  II esophageal varices.  - 3 cm hiatal hernia.  - Portal hypertensive gastropathy.  - Normal examined duodenum. This patient appears to have had self-limited gastric and/or esophageal mucosal bleeding in the setting of severe portal hypertensive changes and esophageal varices that may be  extending into the upper gastric folds. No definite stigmata of esophageal variceal bleeding requiring intervention. Start nonselective beta-blocker (nadolol  or carvedilol) for indefinite use.  EGD as an inpatient secondary to upper GI bleed (hematemesis/melena) by Dr. Legrand 06/19/2022: - Esophageal mucosal changes suspicious for long-segment Barrett's esophagus. Biopsied. (two Bx taken b/c done in setting of upper GI bleeding - more extensive Bx will be needed at a follow up EGD to look for dysplasia)  - Esophageal ulcer with no bleeding and no stigmata of recent bleeding.  - Portal hypertensive gastropathy.  - Erosive gastropathy with no bleeding and no stigmata of recent bleeding.  - Multiple non-bleeding duodenal ulcers with no stigmata of bleeding.  - Several biopsies were obtained on the greater curvature of the gastric body, on the lesser curvature of the gastric body, on the greater curvature of the gastric antrum and on the lesser curvature of the gastric antrum. - Biopsies of the stomach showed antral erythema, no H. pylori and no intestinal metaplasia.   Past Medical History:  Diagnosis Date   Alcohol abuse    Asthma    Back pain    Bulging lumbar disc    COVID    01/2021   Diabetes mellitus without complication (HCC)    Encounter for lumbar puncture    Hematochezia    Hypokalemia    Hyponatremia    Lower GI bleed    Neuropathy     Past Surgical History:  Procedure Laterality Date   BIOPSY  06/19/2022   Procedure: BIOPSY;  Surgeon: Legrand Victory LITTIE DOUGLAS, MD;  Location: [facility] ENDOSCOPY;  Service: Gastroenterology;;   BIOPSY  01/27/2024   Procedure: BIOPSY;  Surgeon: Legrand Victory LITTIE DOUGLAS, MD;  Location: [facility] ENDOSCOPY;  Service: Gastroenterology;;   ESOPHAGOGASTRODUODENOSCOPY (EGD) WITH PROPOFOL  N/A 06/19/2022   Procedure: ESOPHAGOGASTRODUODENOSCOPY (EGD) WITH PROPOFOL ;  Surgeon: Legrand Victory LITTIE DOUGLAS, MD;  Location: [facility] ENDOSCOPY;  Service: Gastroenterology;  Laterality: N/A;    ESOPHAGOGASTRODUODENOSCOPY (EGD) WITH PROPOFOL  N/A 01/27/2024   Procedure: ESOPHAGOGASTRODUODENOSCOPY (EGD) WITH PROPOFOL ;  Surgeon: Legrand Victory LITTIE DOUGLAS, MD;  Location: [facility] ENDOSCOPY;  Service: Gastroenterology;  Laterality: N/A;    Prior to Admission medications   Medication Sig Start Date End Date Taking? Authorizing Provider  albuterol  (VENTOLIN  HFA) 108 (90 Base) MCG/ACT inhaler INHALE 2 PUFFS INTO THE LUNGS EVERY SIX HOURS AS NEEDED FOR WHEEZING OR SHORTNESS OF BREATH. 01/26/21 09/24/25 Yes Ghimire, Donalda HERO, MD  baclofen  (LIORESAL ) 10 MG tablet Take 10 mg by mouth 2 (two) times daily. 01/20/24  Yes [provider]  ferrous sulfate 325 (65 FE) MG tablet Take 325 mg by mouth daily. 09/08/24  Yes [provider]  gabapentin  (NEURONTIN ) 100 MG capsule Take 100 mg tablet three times daily with 800 mg tablet three times daily for a total of 900 mg total three times daily 09/23/24  Yes Hill, Venetia LITTIE, MD  gabapentin  (NEURONTIN ) 800 MG tablet Take 100 mg tablet three times daily with 800 mg tablet three times daily for a total of 900 mg total three times daily 09/23/24  Yes Hill, Venetia LITTIE, MD  HYDROcodone -acetaminophen  (NORCO) 7.5-325 MG tablet Take 1 tablet by mouth 3 (three) times daily as needed. Patient taking differently: Take 1 tablet  by mouth 3 (three) times daily. 01/09/24  Yes [provider]  loratadine (CLARITIN) 10 MG tablet Take 10 mg by mouth daily. 09/06/24  Yes [provider]  metFORMIN  (GLUCOPHAGE ) 500 MG tablet Take 1 tablet (500 mg total) by mouth 2 (two) times daily with a meal. 02/27/23 09/24/25 Yes Hill, Venetia CROME, MD  pantoprazole  (PROTONIX ) 40 MG tablet TAKE 1 TABLET BY MOUTH TWICE A DAY 03/02/24  Yes Hill, Venetia CROME, MD  thiamine  (VITAMIN B1) 100 MG tablet Take 1 tablet (100 mg total) by mouth daily. 01/06/23  Yes Leigh Venetia CROME, MD  amoxicillin -clavulanate (AUGMENTIN ) 875-125 MG tablet Take 1 tablet by mouth 2 (two) times daily. Patient not taking: No  sig reported 08/23/24   Gladis Elsie BROCKS, PA-C  ascorbic acid  (VITAMIN C) 500 MG tablet Take 500 mg by mouth daily. Patient not taking: Reported on 09/24/2024 06/02/24   [provider]  nadolol  (CORGARD ) 20 MG tablet Take 1 tablet (20 mg total) by mouth daily. Patient not taking: No sig reported 01/29/24   Raenelle Donalda HERO, MD  dicyclomine  (BENTYL ) 20 MG tablet Take 1 tablet (20 mg total) by mouth 2 (two) times daily. Patient not taking: Reported on 11/02/2019 03/02/14 11/03/19  Harris, Abigail, PA-C    Current Facility-Administered Medications  Medication Dose Route Frequency Provider Last Rate Last Admin   Chlorhexidine Gluconate Cloth 2 % PADS 6 each  6 each Topical Daily Maree Harder, MD       dextrose  5 % in lactated ringers  infusion   Intravenous Continuous Maree Harder, MD 50 mL/hr at 09/25/24 0015 New Bag at 09/25/24 0015   docusate sodium (COLACE) capsule 100 mg  100 mg Oral BID PRN Maree Harder, MD       fentaNYL  (SUBLIMAZE ) injection 25 mcg  25 mcg Intravenous Q2H PRN Maree Harder, MD   25 mcg at 09/25/24 0457   insulin  aspart (novoLOG ) injection 0-15 Units  0-15 Units Subcutaneous Q4H Maree Harder, MD       nicotine  (NICODERM CQ  - dosed in mg/24 hours) patch 21 mg  21 mg Transdermal Once Tegeler, Lonni PARAS, MD   21 mg at 09/24/24 2150   octreotide  (SANDOSTATIN ) 500 mcg in sodium chloride  0.9 % 250 mL (2 mcg/mL) infusion  50 mcg/hr Intravenous Continuous Tegeler, Lonni PARAS, MD 25 mL/hr at 09/25/24 0626 50 mcg/hr at 09/25/24 0626   ondansetron  (ZOFRAN ) injection 4 mg  4 mg Intravenous Q6H PRN Maree Harder, MD   4 mg at 09/25/24 0251   pantoprazole  (PROTONIX ) injection 40 mg  40 mg Intravenous Q12H Maree Harder, MD       polyethylene glycol (MIRALAX  / GLYCOLAX ) packet 17 g  17 g Oral Daily PRN Maree Harder, MD       thiamine  (VITAMIN B1) injection 100 mg  100 mg Intravenous Daily Maree Harder, MD       Current Outpatient Medications  Medication Sig Dispense Refill    albuterol  (VENTOLIN  HFA) 108 (90 Base) MCG/ACT inhaler INHALE 2 PUFFS INTO THE LUNGS EVERY SIX HOURS AS NEEDED FOR WHEEZING OR SHORTNESS OF BREATH. 18 g 0   baclofen  (LIORESAL ) 10 MG tablet Take 10 mg by mouth 2 (two) times daily.     ferrous sulfate 325 (65 FE) MG tablet Take 325 mg by mouth daily.     gabapentin  (NEURONTIN ) 100 MG capsule Take 100 mg tablet three times daily with 800 mg tablet three times daily for a total of 900 mg total three times daily 90  capsule 11   gabapentin  (NEURONTIN ) 800 MG tablet Take 100 mg tablet three times daily with 800 mg tablet three times daily for a total of 900 mg total three times daily 90 tablet 11   HYDROcodone -acetaminophen  (NORCO) 7.5-325 MG tablet Take 1 tablet by mouth 3 (three) times daily as needed. (Patient taking differently: Take 1 tablet by mouth 3 (three) times daily.)     loratadine (CLARITIN) 10 MG tablet Take 10 mg by mouth daily.     metFORMIN  (GLUCOPHAGE ) 500 MG tablet Take 1 tablet (500 mg total) by mouth 2 (two) times daily with a meal. 60 tablet 2   pantoprazole  (PROTONIX ) 40 MG tablet TAKE 1 TABLET BY MOUTH TWICE A DAY 60 tablet 3   thiamine  (VITAMIN B1) 100 MG tablet Take 1 tablet (100 mg total) by mouth daily. 30 tablet 11   amoxicillin -clavulanate (AUGMENTIN ) 875-125 MG tablet Take 1 tablet by mouth 2 (two) times daily. (Patient not taking: No sig reported) 14 tablet 0   ascorbic acid  (VITAMIN C) 500 MG tablet Take 500 mg by mouth daily. (Patient not taking: Reported on 09/24/2024)     nadolol  (CORGARD ) 20 MG tablet Take 1 tablet (20 mg total) by mouth daily. (Patient not taking: No sig reported) 30 tablet 2    Allergies as of 09/24/2024 - Review Complete 09/24/2024  Allergen Reaction Noted   Lidocaine -glycerin Other (See Comments) 02/01/2021    Family History  Problem Relation Age of Onset   Cirrhosis Mother    Cirrhosis Father    Cancer Maternal Grandfather    Cancer Paternal Grandfather    Diabetes Maternal Aunt     Hypertension Neg Hx     Social History   Socioeconomic History   Marital status: Legally Separated    Spouse name: Not on file   Number of children: 2   Years of education: Not on file   Highest education level: Not on file  Occupational History   Not on file  Tobacco Use   Smoking status: Some Days    Types: Cigarettes   Smokeless tobacco: Never   Tobacco comments:    Close to a pack at day--1/2 pack at day 09/10/24  Vaping Use   Vaping status: Never Used  Substance and Sexual Activity   Alcohol use: Yes    Alcohol/week: 6.0 standard drinks of alcohol    Types: 6 Cans of beer per week    Comment: occas   Drug use: Yes    Comment: cocaine positive 01/2022/ CbD   Sexual activity: Yes  Other Topics Concern   Not on file  Social History Narrative   Right handed   Caffeine none   Live in one level home      Are you currently employed ?    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   Who lives with you? Girlfriend   What type of home do you live in: 1 story or 2 story? one       Social Drivers of Corporate Investment Banker Strain: Low Risk  (02/01/2021)   Overall Financial Resource Strain (CARDIA)    Difficulty of Paying Living Expenses: Not hard at all  Food Insecurity: No Food Insecurity (01/27/2024)   Hunger Vital Sign    Worried About Running Out of Food in the Last Year: Never true    Ran Out of Food in the Last Year: Never true  Transportation Needs: No Transportation Needs (01/27/2024)  PRAPARE - Administrator, Civil Service (Medical): No    Lack of Transportation (Non-Medical): No  Physical Activity: Not on file  Stress: Not on file  Social Connections: Unknown (04/16/2022)   Received from [facility]   Social Network    Social Network: Not on file  Intimate Partner Violence: Not At Risk (01/27/2024)   Humiliation, Afraid, Rape, and Kick questionnaire    Fear of Current or Ex-Partner: No    Emotionally Abused:  No    Physically Abused: No    Sexually Abused: No    Review of Systems: Gen: Denies fever, sweats or chills. No weight loss.  CV: Denies chest pain, palpitations or edema. Resp: Denies cough, shortness of breath of hemoptysis.  GI: Denies heartburn or dysphagia.  See HPI. GU : Denies urinary burning, blood in urine, increased urinary frequency or incontinence. MS: Denies joint pain, muscles aches or weakness. Derm: Denies rash, itchiness, skin lesions or unhealing ulcers. Psych: Denies depression, anxiety, memory loss or confusion. Heme: Denies easy bruising, bleeding. Neuro: Denies confusion.   Endo:  Denies any problems with DM, thyroid or adrenal function.  Physical Exam: Vital signs in last 24 hours: Temp:  [98.4 F (36.9 C)-98.5 F (36.9 C)] 98.5 F (36.9 C) (10/25 0557) Pulse Rate:  [119-142] 141 (10/25 0700) Resp:  [14-27] 22 (10/25 0700) BP: (83-153)/(53-102) 131/81 (10/25 0700) SpO2:  [95 %-100 %] 96 % (10/25 0700) Weight:  [92 kg] 92 kg (10/24 2101)   General:  Alert, well-developed, well-nourished, pleasant and cooperative in NAD. Head:  Normocephalic and atraumatic. Eyes: Mild scleral icterus.  Conjunctiva pink. Ears:  Normal auditory acuity. Nose:  No deformity, discharge or lesions. Mouth:  Dentition intact. No ulcers or lesions.  Neck:  Supple. No lymphadenopathy or thyromegaly.  Lungs: Scattered inspiratory and expiratory wheezes throughout right and left lung fields.  On room air.  Heart: Tachycardic, no murmurs. Abdomen: Protuberant obese abdomen with mild generalized tenderness.  Questionable hepatomegaly.  + Splenomegaly. No ascites/fluid wave assessed.  Positive bowel sounds to all 4 quadrants.  No palpable mass. Rectal: Deferred. Musculoskeletal:  Symmetrical without gross deformities.  Pulses:  Normal pulses noted. Extremities:  Without clubbing or edema. Neurologic:  Alert and oriented x 4.  Speech is clear.  Moves all extremities weakly.  Hands  slightly tremulous with possible early asterixis. Skin: Jaundiced, facial flushing. Psych:  Alert and cooperative. Normal mood and affect.  Intake/Output from previous day: 10/24 0701 - 10/25 0700 In: 315 [Blood:315] Out: -  Intake/Output this shift: No intake/output data recorded.  Lab Results: Recent Labs    09/24/24 2126 09/25/24 0056 09/25/24 0353  WBC 17.7* 12.1* 10.2  HGB 8.3* 8.0* 8.1*  HCT 24.7* 24.3* 23.9*  PLT 71* 55* 50*   BMET Recent Labs    09/24/24 2126  NA 135  K 3.5  CL 93*  CO2 15*  GLUCOSE 74  BUN 11  CREATININE 0.79  CALCIUM  7.9*   LFT Recent Labs    09/24/24 2126  PROT 7.2  ALBUMIN 3.1*  AST 76*  ALT 14  ALKPHOS 97  BILITOT 4.8*   PT/INR Recent Labs    09/24/24 2132  LABPROT 20.0*  INR 1.6*   Hepatitis Panel No results for input(s): HEPBSAG, HCVAB, HEPAIGM, HEPBIGM in the last 72 hours.  MELD 3.0: 19 at 09/24/2024  9:32 PM MELD-Na: 19 at 09/24/2024  9:32 PM Calculated from: Serum Creatinine: 0.79 mg/dL (Using min of 1 mg/dL) at 89/75/7974  0:73  PM Serum Sodium: 135 mmol/L at 09/24/2024  9:26 PM Total Bilirubin: 4.8 mg/dL at 89/75/7974  0:73 PM Serum Albumin: 3.1 g/dL at 89/75/7974  0:73 PM INR(ratio): 1.6 at 09/24/2024  9:32 PM Age at listing (hypothetical): 38 years Sex: Male at 09/24/2024  9:32 PM    Studies/Results: CT ANGIO GI BLEED Result Date: 09/24/2024 CLINICAL DATA:  Hematemesis rectal bleeding EXAM: CTA ABDOMEN AND PELVIS WITHOUT AND WITH CONTRAST TECHNIQUE: Multidetector CT imaging of the abdomen and pelvis was performed using the standard protocol during bolus administration of intravenous contrast. Multiplanar reconstructed images and MIPs were obtained and reviewed to evaluate the vascular anatomy. RADIATION DOSE REDUCTION: This exam was performed according to the departmental dose-optimization program which includes automated exposure control, adjustment of the mA and/or kV according to patient size  and/or use of iterative reconstruction technique. CONTRAST:  75mL OMNIPAQUE  IOHEXOL  350 MG/ML SOLN COMPARISON:  MRI 01/27/2024 CT 01/27/2024, 06/19/2022 FINDINGS: VASCULAR Aorta: Normal caliber aorta without aneurysm, dissection, vasculitis or significant stenosis. Mild atherosclerosis. Celiac: Patent without evidence of aneurysm, dissection, vasculitis or significant stenosis. SMA: Patent without evidence of aneurysm, dissection, vasculitis or significant stenosis. Renals: Both renal arteries are patent without evidence of aneurysm, dissection, vasculitis, fibromuscular dysplasia or significant stenosis. IMA: Patent without evidence of aneurysm, dissection, vasculitis or significant stenosis. Inflow: Patent without evidence of aneurysm, dissection, vasculitis or significant stenosis. Proximal Outflow: Bilateral common femoral and visualized portions of the superficial and profunda femoral arteries are patent without evidence of aneurysm, dissection, vasculitis or significant stenosis. Veins: Patent portal and splenic veins. IVC is grossly unremarkable. Recanalized paraumbilical vein. Small gastroesophageal varices. Review of the MIP images confirms the above findings. NON-VASCULAR Lower chest: Lung bases are clear. Small hiatal hernia containing fluid, chronic. Hepatobiliary: Liver cirrhosis. Multiple hepatic nodules. Sludge and stones in the gallbladder. No biliary dilatation. Pancreas: Minimal fat stranding.  No ductal dilatation Spleen: Enlarged with AP measurement of 15 cm, previously 14.5 cm Adrenals/Urinary Tract: Adrenal glands are normal. Kidneys show no hydronephrosis. Slightly thick walled urinary bladder. Stomach/Bowel: Stomach within normal limits. Soft tissue stranding at the second and third portion of duodenum with trace fluid. No acute bowel wall thickening. Negative appendix Lymphatic: No suspicious lymph nodes Reproductive: Negative prostate Other: Negative for free air. No ascites. Trace  stranding and thickening in the anterior pararenal spaces. Musculoskeletal: No acute osseous abnormality. Chronic bilateral pars defect at L5 IMPRESSION: 1. Negative for acute aortic dissection or aneurysm. No evidence for intraluminal extravasation of contrast to suggest active GI bleeding on this exam. 2. Liver cirrhosis with evidence of portal hypertension including splenomegaly and small gastroesophageal varices. 3. Multiple hepatic nodules, better evaluated on recent MRI. 4. Mild fat stranding about the pancreas and second and third portion of duodenum, question mild pancreatitis or duodenitis. 5. Cholelithiasis and sludge within the gallbladder without right upper quadrant inflammation. 6. Slightly thick-walled urinary bladder, but decompressed. Question cystitis. 7. Aortic atherosclerosis. Aortic Atherosclerosis (ICD10-I70.0). Electronically Signed   By: Luke Bun M.D.   On: 09/24/2024 23:20    IMPRESSION/PLAN:  38 year old male with decompensated alcohol associated cirrhosis, grade 2 esophageal varices, portal hypertensive gastropathy and thrombocytopenia presents with UGI bleed, hematemesis and melena. Prior history of UGI bleed 06/2022 and 01/2024. MELD.3: 19. - NPO - EGD benefits and risks discussed including risk with sedation, risk of bleeding, perforation and infection. Bed side EGD today, patient will require intubation for airway protection. Await further recommendations per Dr. Legrand. - Continue Octreotide  infusion 50cmg/hr - PPI IV bid -  Ondansetron  4 mg  IV every 6 hours as needed - Continue Ceftriaxone  1 g IV daily for infection prophylaxis in setting of cirrhotic patient with GI bleed - Eventual liver MRI to further assess hepatic nodules - Transfuse for hemoglobin level less than 7 - CBC, BMP, hepatic panel, PT/INR and AFP in a.m.  Alcohol use disorder - CIWA protocol in process - Continue thiamine  IV daily - Patient counseled complete alcohol abstinence  imperative  History of esophageal and gastric ulcers  - See plan above   Normocytic anemia secondary to cirrhosis and acute GI blood loss - See plan above  Thrombocytopenia secondary to cirrhosis and splenomegaly  Coagulopathy.  INR 1.6. Received vitamin K 10 mg IV x 1. - INR in am  Lactic acidosis, received aggressive IV fluids - Management per the medical service  Asthma - Neb treatments per the medical service       Elida CHRISTELLA Shawl  09/25/2024, 8:29 AM  I have taken an interval history, thoroughly reviewed the chart and examined the patient. I agree with the Advanced Practitioner's note, impression and recommendations, and have recorded additional findings, impressions and recommendations below. I performed a substantive portion of this encounter (>50% time spent), including a complete performance of the medical decision making.  My additional thoughts are as follows:  Patient with a history of alcohol related cirrhosis, upper GI varices and duodenal ulcer here for hematemesis and melena with acute blood loss anemia, thrombocytopenia, coagulopathy and CTA negative for active bleeding with some questionable stranding in the duodenum.  No ascites he has been volume resuscitated, currently blood pressure is normal but still tachycardic in the 120s.  He is alert and conversational sitting up on the edge of the bed.  No apparent encephalopathy, cardiopulmonary exam notable for some expiratory wheezing, abdomen obese left lobe liver enlarged, no spleen tip palpable though limited by body habitus.  Overall clinical picture most concerning for variceal bleed.  Patient currently on IV PPI and octreotide  being cared for in the ICU.  Persistently tachycardic despite normalization of blood pressure, suggesting there could be some element of alcohol withdrawal as well.  He is in need of an upper endoscopy today.  Due to anesthesia/OR operational issues and availability today, this  will be done as a bedside case in the ICU.  Due to his history of substance abuse and his current clinical condition, he needs general anesthesia for sufficient sedation and upper airway protection.  I spoke with Dr. Claudene of the critical care team, and he is agreeable to elective intubation for the procedure.  I discussed all this with Mr. Soward along with detailed explanation of the risks and benefits of upper endoscopy and he is agreeable.  Naturally, he is familiar with this procedure from having had it done in 2023 and again earlier this year.  The benefits and risks of the planned procedure(s) were described in detail with the patient or (when appropriate) their health care proxy.  Risks were outlined as including, but not limited to, bleeding, infection, perforation, adverse medication reaction leading to cardiac or pulmonary decompensation, pancreatitis (if ERCP).  The limitation of incomplete mucosal visualization was also discussed.  No guarantees or warranties were given.  Patient at increased risk for cardiopulmonary complications of procedure due to medical comorbidities.  No current need for platelet transfusion or FFP.  Further recommendations following endoscopic procedure. _________________  This consultation required a high degree of medical decision making due to the nature and complexity of the  acute condition(s) being evaluated as well as the patient's medical comorbidities.  Victory LITTIE Brand III Office:[phone]

## 2024-09-25 NOTE — Plan of Care (Signed)

## 2024-09-25 NOTE — Progress Notes (Signed)
   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  He  also c/o weakness.  Says he has a stomach ulcer, does not know about esophageal varices.  Says he has cut down on his drinking a lot this year.  He says he is not drinking hard liquor anymore, only beer. In ED his BP was relatively low and received 1 liter fluid bolus, Octreotide  started and Vitamin K, and to get 1 unit PRBC.  GI has been consulted. AG 27, WBC 17.7, HgB 8.3, PLT 71, INR 1.6, ETOH level 110. CT abd showing:  Negative for acute aortic dissection or aneurysm. No evidence for intraluminal extravasation of contrast to suggest active GI bleeding on this exam. 2. Liver cirrhosis with evidence of portal hypertension including splenomegaly and small gastroesophageal varices. 3. Multiple hepatic nodules, better evaluated on recent MRI. 4. Mild fat stranding about the pancreas and second and third portion of duodenum, question mild pancreatitis or duodenitis. 5. Cholelithiasis and sludge within the gallbladder without right upper quadrant inflammation. 6. Slightly thick-walled urinary bladder, but decompressed. Question cystitis. 7. Aortic atherosclerosis.  Aortic Atherosclerosis (ICD10-I70.0).   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  Objective    Blood pressure (!) 150/86, pulse (!) 118, temperature 98 F (36.7 C), temperature source Oral, resp. rate (!) 27, height 5' 11 (1.803 m), weight 70.4 kg, SpO2 96%.        Intake/Output Summary (Last 24 hours) at 09/25/2024 1155 Last data filed at 09/25/2024 0800 Gross per 24 hour  Intake 1950.02 ml  Output --  Net 1950.02 ml   Filed Weights   09/24/24 2101 09/25/24 0800  Weight: 92 kg 70.4 kg    Examination: No distress Nonlabored breathing pattern No edema Abd soft, +BS No edema Aox3  Lactate clearing H/H 7.5  Resolved problem list   Assessment and Plan  Acute upper GI bleed ABLA Melanotic stools from upper GI bleed Liver cirrhosis ETOH abuse Portal HTN AGMA H/o Pre-DM Thiamine  deficiency with peripheral neuropathy  Some anesthesia coverage issues so plan for bedside ETT and EGD today NPO, octreotide , PPI, thiamine  as ordered Patient consents to proceed  Rolan Sharps MD PCCM

## 2024-09-25 NOTE — Procedures (Signed)
 Extubation Procedure Note  Patient Details:   Name: Shane Holmes DOB: 1986/07/03 MRN: [id number]   Airway Documentation:    Vent end date: 09/25/24 Vent end time: 1528   Evaluation  O2 sats: stable throughout Complications: No apparent complications Patient did tolerate procedure well. BBS: dec'd and clear   Yes  Pt extubated to 10 lpm [place]. No stridor noted post extubation. Tolerated well.  Bari CHRISTELLA Daunt 09/25/2024, 3:49 PM

## 2024-09-25 NOTE — Consult Note (Addendum)
 Referring Provider: Dr. Lonni Tegeler Primary Care Physician:  Claudene Jackee DASEN, PA-C Primary Gastroenterologist:  Sampson, seen by [facility] as an inpatient 2023 and 01/2024  Reason for Consultation:  UGI bleed, hematemesis and dark stools  HPI: Shane Holmes is a 38 y.o. male with a past medical history of asthma, DM type II, peripheral neuropathy, chronic back pain, thiamine  deficiency, esophageal ulcer, duodenal ulcers, alcohol use disorder with cirrhosis, grade 2 esophageal varices with GI bleed 06/20/2022 and 01/22/2024, portal hypertensive gastropathy, thrombocytopenia and anemia.  He presented to the ED 09/24/2024 with hematemesis and dark stools. Labs in the ED showed a WBC count of 17.7.  Hemoglobin 8.3 (down from 11.6 on 01/29/2024).  Hematocrit 24.7.  Platelets 71.  Sodium 135.  Potassium 3.5.  BUN 11.  Creatinine 0.79.  Albumin 3.6.  Total bili 4.8.  Alk phos 97.  AST 76.  ALT 14.  Anion gap 27.  Lipase 50.  INR 1.6.  Ammonia 77.  Ethyl alcohol 110.  CTA was negative for active GI bleeding and showed cirrhosis with portal hypertension, splenomegaly, small gastroesophageal varices, multiple hepatic nodules, mild fat stranding to the pancreas and 2nd/3rd portion of the duodenum, cholelithiasis with sludge within the gallbladder, slightly thick-walled urinary bladder and aortic atherosclerosis.  He received IV fluids and was started on Octreotide  and PPI IV.  A GI consult was requested for further management of upper GI bleed.  Labs today: WBC 12.1.  Hemoglobin 8.0.  Platelets 55.  Lactic acid 13.1.  Repeat lactic acid 4.5.  Transfused 1 unit of PRBCs.  Posttransfusion H&H 8.1 at 3:53 AM.  Received Reglan  10 mg IV x 1.  He endorsed feeling quite well on Thursday 09/2022.  He awakened shortly after midnight Friday 10/24 with hematemesis.  He vomited dark red blood at least 10 times and noted passing a few black stools.  While in the ED he had emesis x 2 and passed 2 black stools.   No further hematemesis or melena overnight or thus far this morning.  He denied having any abdominal pain prior to the onset of vomiting and now endorses having generalized abdominal soreness.  No NSAID use.  He takes Pantoprazole  40 mg twice daily at home.  He previously drink heavily on a daily basis, however, for the past 1-1/2 years he decreased his alcohol intake.  He sporadically drinks a malt liquor beverage once every 2 weeks.  Last alcohol intake was 5 days ago.  Denies having confusion.  Previously hospitalized secondary to upper GI bleed 06/20/2022 and 01/22/2024.  EGD 06/19/2022 identified an esophageal ulcer without stigmata of recent bleeding, portal hypertensive gastropathy erosive gastropathy, nonbleeding duodenal ulcers without stigmata of bleeding.  Path report was negative for H. pylori.  EGD 01/27/2024 identified grade 2 esophageal varices, portal hypertensive gastropathy and a 3 cm hiatal hernia.  At that time, it was felt bleeding was self-limited secondary to gastric and/or esophageal mucosal bleeding in the setting of severe portal hypertensive changes and esophageal varices possibly extending into the upper gastric folds.  No definitive stigmata of esophageal variceal bleeding was identified.  Patient previously prescribed Nadolol , not taking it.  Mother and father with history of cirrhosis.  He has chronic back pain and chronic neuropathy associated pain for which he takes codon 3 times daily per pain management.  GI PROCEDURES:  EGD as an inpatient secondary to UGI bleed (hematemesis) by Dr. Legrand 01/27/2024. - Salmon-colored mucosa suspicious for long-segment Barrett's esophagus. Biopsied.  - Grade  II esophageal varices.  - 3 cm hiatal hernia.  - Portal hypertensive gastropathy.  - Normal examined duodenum. This patient appears to have had self-limited gastric and/or esophageal mucosal bleeding in the setting of severe portal hypertensive changes and esophageal varices that may be  extending into the upper gastric folds. No definite stigmata of esophageal variceal bleeding requiring intervention. Start nonselective beta-blocker (nadolol  or carvedilol) for indefinite use.  EGD as an inpatient secondary to upper GI bleed (hematemesis/melena) by Dr. Legrand 06/19/2022: - Esophageal mucosal changes suspicious for long-segment Barrett's esophagus. Biopsied. (two Bx taken b/c done in setting of upper GI bleeding - more extensive Bx will be needed at a follow up EGD to look for dysplasia)  - Esophageal ulcer with no bleeding and no stigmata of recent bleeding.  - Portal hypertensive gastropathy.  - Erosive gastropathy with no bleeding and no stigmata of recent bleeding.  - Multiple non-bleeding duodenal ulcers with no stigmata of bleeding.  - Several biopsies were obtained on the greater curvature of the gastric body, on the lesser curvature of the gastric body, on the greater curvature of the gastric antrum and on the lesser curvature of the gastric antrum. - Biopsies of the stomach showed antral erythema, no H. pylori and no intestinal metaplasia.   Past Medical History:  Diagnosis Date   Alcohol abuse    Asthma    Back pain    Bulging lumbar disc    COVID    01/2021   Diabetes mellitus without complication (HCC)    Encounter for lumbar puncture    Hematochezia    Hypokalemia    Hyponatremia    Lower GI bleed    Neuropathy     Past Surgical History:  Procedure Laterality Date   BIOPSY  06/19/2022   Procedure: BIOPSY;  Surgeon: Legrand Victory LITTIE DOUGLAS, MD;  Location: [facility] ENDOSCOPY;  Service: Gastroenterology;;   BIOPSY  01/27/2024   Procedure: BIOPSY;  Surgeon: Legrand Victory LITTIE DOUGLAS, MD;  Location: [facility] ENDOSCOPY;  Service: Gastroenterology;;   ESOPHAGOGASTRODUODENOSCOPY (EGD) WITH PROPOFOL  N/A 06/19/2022   Procedure: ESOPHAGOGASTRODUODENOSCOPY (EGD) WITH PROPOFOL ;  Surgeon: Legrand Victory LITTIE DOUGLAS, MD;  Location: [facility] ENDOSCOPY;  Service: Gastroenterology;  Laterality: N/A;    ESOPHAGOGASTRODUODENOSCOPY (EGD) WITH PROPOFOL  N/A 01/27/2024   Procedure: ESOPHAGOGASTRODUODENOSCOPY (EGD) WITH PROPOFOL ;  Surgeon: Legrand Victory LITTIE DOUGLAS, MD;  Location: [facility] ENDOSCOPY;  Service: Gastroenterology;  Laterality: N/A;    Prior to Admission medications   Medication Sig Start Date End Date Taking? Authorizing Provider  albuterol  (VENTOLIN  HFA) 108 (90 Base) MCG/ACT inhaler INHALE 2 PUFFS INTO THE LUNGS EVERY SIX HOURS AS NEEDED FOR WHEEZING OR SHORTNESS OF BREATH. 01/26/21 09/24/25 Yes Ghimire, Donalda HERO, MD  baclofen  (LIORESAL ) 10 MG tablet Take 10 mg by mouth 2 (two) times daily. 01/20/24  Yes [provider]  ferrous sulfate 325 (65 FE) MG tablet Take 325 mg by mouth daily. 09/08/24  Yes [provider]  gabapentin  (NEURONTIN ) 100 MG capsule Take 100 mg tablet three times daily with 800 mg tablet three times daily for a total of 900 mg total three times daily 09/23/24  Yes Hill, Venetia LITTIE, MD  gabapentin  (NEURONTIN ) 800 MG tablet Take 100 mg tablet three times daily with 800 mg tablet three times daily for a total of 900 mg total three times daily 09/23/24  Yes Hill, Venetia LITTIE, MD  HYDROcodone -acetaminophen  (NORCO) 7.5-325 MG tablet Take 1 tablet by mouth 3 (three) times daily as needed. Patient taking differently: Take 1 tablet  by mouth 3 (three) times daily. 01/09/24  Yes [provider]  loratadine (CLARITIN) 10 MG tablet Take 10 mg by mouth daily. 09/06/24  Yes [provider]  metFORMIN  (GLUCOPHAGE ) 500 MG tablet Take 1 tablet (500 mg total) by mouth 2 (two) times daily with a meal. 02/27/23 09/24/25 Yes Hill, Venetia CROME, MD  pantoprazole  (PROTONIX ) 40 MG tablet TAKE 1 TABLET BY MOUTH TWICE A DAY 03/02/24  Yes Hill, Venetia CROME, MD  thiamine  (VITAMIN B1) 100 MG tablet Take 1 tablet (100 mg total) by mouth daily. 01/06/23  Yes Leigh Venetia CROME, MD  amoxicillin -clavulanate (AUGMENTIN ) 875-125 MG tablet Take 1 tablet by mouth 2 (two) times daily. Patient not taking: No  sig reported 08/23/24   Gladis Elsie BROCKS, PA-C  ascorbic acid  (VITAMIN C) 500 MG tablet Take 500 mg by mouth daily. Patient not taking: Reported on 09/24/2024 06/02/24   [provider]  nadolol  (CORGARD ) 20 MG tablet Take 1 tablet (20 mg total) by mouth daily. Patient not taking: No sig reported 01/29/24   Raenelle Donalda HERO, MD  dicyclomine  (BENTYL ) 20 MG tablet Take 1 tablet (20 mg total) by mouth 2 (two) times daily. Patient not taking: Reported on 11/02/2019 03/02/14 11/03/19  Harris, Abigail, PA-C    Current Facility-Administered Medications  Medication Dose Route Frequency Provider Last Rate Last Admin   Chlorhexidine Gluconate Cloth 2 % PADS 6 each  6 each Topical Daily Maree Harder, MD       dextrose  5 % in lactated ringers  infusion   Intravenous Continuous Maree Harder, MD 50 mL/hr at 09/25/24 0015 New Bag at 09/25/24 0015   docusate sodium (COLACE) capsule 100 mg  100 mg Oral BID PRN Maree Harder, MD       fentaNYL  (SUBLIMAZE ) injection 25 mcg  25 mcg Intravenous Q2H PRN Maree Harder, MD   25 mcg at 09/25/24 0457   insulin  aspart (novoLOG ) injection 0-15 Units  0-15 Units Subcutaneous Q4H Maree Harder, MD       nicotine  (NICODERM CQ  - dosed in mg/24 hours) patch 21 mg  21 mg Transdermal Once Tegeler, Lonni PARAS, MD   21 mg at 09/24/24 2150   octreotide  (SANDOSTATIN ) 500 mcg in sodium chloride  0.9 % 250 mL (2 mcg/mL) infusion  50 mcg/hr Intravenous Continuous Tegeler, Lonni PARAS, MD 25 mL/hr at 09/25/24 0626 50 mcg/hr at 09/25/24 0626   ondansetron  (ZOFRAN ) injection 4 mg  4 mg Intravenous Q6H PRN Maree Harder, MD   4 mg at 09/25/24 0251   pantoprazole  (PROTONIX ) injection 40 mg  40 mg Intravenous Q12H Maree Harder, MD       polyethylene glycol (MIRALAX  / GLYCOLAX ) packet 17 g  17 g Oral Daily PRN Maree Harder, MD       thiamine  (VITAMIN B1) injection 100 mg  100 mg Intravenous Daily Maree Harder, MD       Current Outpatient Medications  Medication Sig Dispense Refill    albuterol  (VENTOLIN  HFA) 108 (90 Base) MCG/ACT inhaler INHALE 2 PUFFS INTO THE LUNGS EVERY SIX HOURS AS NEEDED FOR WHEEZING OR SHORTNESS OF BREATH. 18 g 0   baclofen  (LIORESAL ) 10 MG tablet Take 10 mg by mouth 2 (two) times daily.     ferrous sulfate 325 (65 FE) MG tablet Take 325 mg by mouth daily.     gabapentin  (NEURONTIN ) 100 MG capsule Take 100 mg tablet three times daily with 800 mg tablet three times daily for a total of 900 mg total three times daily 90  capsule 11   gabapentin  (NEURONTIN ) 800 MG tablet Take 100 mg tablet three times daily with 800 mg tablet three times daily for a total of 900 mg total three times daily 90 tablet 11   HYDROcodone -acetaminophen  (NORCO) 7.5-325 MG tablet Take 1 tablet by mouth 3 (three) times daily as needed. (Patient taking differently: Take 1 tablet by mouth 3 (three) times daily.)     loratadine (CLARITIN) 10 MG tablet Take 10 mg by mouth daily.     metFORMIN  (GLUCOPHAGE ) 500 MG tablet Take 1 tablet (500 mg total) by mouth 2 (two) times daily with a meal. 60 tablet 2   pantoprazole  (PROTONIX ) 40 MG tablet TAKE 1 TABLET BY MOUTH TWICE A DAY 60 tablet 3   thiamine  (VITAMIN B1) 100 MG tablet Take 1 tablet (100 mg total) by mouth daily. 30 tablet 11   amoxicillin -clavulanate (AUGMENTIN ) 875-125 MG tablet Take 1 tablet by mouth 2 (two) times daily. (Patient not taking: No sig reported) 14 tablet 0   ascorbic acid  (VITAMIN C) 500 MG tablet Take 500 mg by mouth daily. (Patient not taking: Reported on 09/24/2024)     nadolol  (CORGARD ) 20 MG tablet Take 1 tablet (20 mg total) by mouth daily. (Patient not taking: No sig reported) 30 tablet 2    Allergies as of 09/24/2024 - Review Complete 09/24/2024  Allergen Reaction Noted   Lidocaine -glycerin Other (See Comments) 02/01/2021    Family History  Problem Relation Age of Onset   Cirrhosis Mother    Cirrhosis Father    Cancer Maternal Grandfather    Cancer Paternal Grandfather    Diabetes Maternal Aunt     Hypertension Neg Hx     Social History   Socioeconomic History   Marital status: Legally Separated    Spouse name: Not on file   Number of children: 2   Years of education: Not on file   Highest education level: Not on file  Occupational History   Not on file  Tobacco Use   Smoking status: Some Days    Types: Cigarettes   Smokeless tobacco: Never   Tobacco comments:    Close to a pack at day--1/2 pack at day 09/10/24  Vaping Use   Vaping status: Never Used  Substance and Sexual Activity   Alcohol use: Yes    Alcohol/week: 6.0 standard drinks of alcohol    Types: 6 Cans of beer per week    Comment: occas   Drug use: Yes    Comment: cocaine positive 01/2022/ CbD   Sexual activity: Yes  Other Topics Concern   Not on file  Social History Narrative   Right handed   Caffeine none   Live in one level home      Are you currently employed ?    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   Who lives with you? Girlfriend   What type of home do you live in: 1 story or 2 story? one       Social Drivers of Corporate Investment Banker Strain: Low Risk  (02/01/2021)   Overall Financial Resource Strain (CARDIA)    Difficulty of Paying Living Expenses: Not hard at all  Food Insecurity: No Food Insecurity (01/27/2024)   Hunger Vital Sign    Worried About Running Out of Food in the Last Year: Never true    Ran Out of Food in the Last Year: Never true  Transportation Needs: No Transportation Needs (01/27/2024)  PRAPARE - Administrator, Civil Service (Medical): No    Lack of Transportation (Non-Medical): No  Physical Activity: Not on file  Stress: Not on file  Social Connections: Unknown (04/16/2022)   Received from [facility]   Social Network    Social Network: Not on file  Intimate Partner Violence: Not At Risk (01/27/2024)   Humiliation, Afraid, Rape, and Kick questionnaire    Fear of Current or Ex-Partner: No    Emotionally Abused:  No    Physically Abused: No    Sexually Abused: No    Review of Systems: Gen: Denies fever, sweats or chills. No weight loss.  CV: Denies chest pain, palpitations or edema. Resp: Denies cough, shortness of breath of hemoptysis.  GI: Denies heartburn or dysphagia.  See HPI. GU : Denies urinary burning, blood in urine, increased urinary frequency or incontinence. MS: Denies joint pain, muscles aches or weakness. Derm: Denies rash, itchiness, skin lesions or unhealing ulcers. Psych: Denies depression, anxiety, memory loss or confusion. Heme: Denies easy bruising, bleeding. Neuro: Denies confusion.   Endo:  Denies any problems with DM, thyroid or adrenal function.  Physical Exam: Vital signs in last 24 hours: Temp:  [98.4 F (36.9 C)-98.5 F (36.9 C)] 98.5 F (36.9 C) (10/25 0557) Pulse Rate:  [119-142] 141 (10/25 0700) Resp:  [14-27] 22 (10/25 0700) BP: (83-153)/(53-102) 131/81 (10/25 0700) SpO2:  [95 %-100 %] 96 % (10/25 0700) Weight:  [92 kg] 92 kg (10/24 2101)   General:  Alert, well-developed, well-nourished, pleasant and cooperative in NAD. Head:  Normocephalic and atraumatic. Eyes: Mild scleral icterus.  Conjunctiva pink. Ears:  Normal auditory acuity. Nose:  No deformity, discharge or lesions. Mouth:  Dentition intact. No ulcers or lesions.  Neck:  Supple. No lymphadenopathy or thyromegaly.  Lungs: Scattered inspiratory and expiratory wheezes throughout right and left lung fields.  On room air.  Heart: Tachycardic, no murmurs. Abdomen: Protuberant obese abdomen with mild generalized tenderness.  Questionable hepatomegaly.  + Splenomegaly. No ascites/fluid wave assessed.  Positive bowel sounds to all 4 quadrants.  No palpable mass. Rectal: Deferred. Musculoskeletal:  Symmetrical without gross deformities.  Pulses:  Normal pulses noted. Extremities:  Without clubbing or edema. Neurologic:  Alert and oriented x 4.  Speech is clear.  Moves all extremities weakly.  Hands  slightly tremulous with possible early asterixis. Skin: Jaundiced, facial flushing. Psych:  Alert and cooperative. Normal mood and affect.  Intake/Output from previous day: 10/24 0701 - 10/25 0700 In: 315 [Blood:315] Out: -  Intake/Output this shift: No intake/output data recorded.  Lab Results: Recent Labs    09/24/24 2126 09/25/24 0056 09/25/24 0353  WBC 17.7* 12.1* 10.2  HGB 8.3* 8.0* 8.1*  HCT 24.7* 24.3* 23.9*  PLT 71* 55* 50*   BMET Recent Labs    09/24/24 2126  NA 135  K 3.5  CL 93*  CO2 15*  GLUCOSE 74  BUN 11  CREATININE 0.79  CALCIUM  7.9*   LFT Recent Labs    09/24/24 2126  PROT 7.2  ALBUMIN 3.1*  AST 76*  ALT 14  ALKPHOS 97  BILITOT 4.8*   PT/INR Recent Labs    09/24/24 2132  LABPROT 20.0*  INR 1.6*   Hepatitis Panel No results for input(s): HEPBSAG, HCVAB, HEPAIGM, HEPBIGM in the last 72 hours.  MELD 3.0: 19 at 09/24/2024  9:32 PM MELD-Na: 19 at 09/24/2024  9:32 PM Calculated from: Serum Creatinine: 0.79 mg/dL (Using min of 1 mg/dL) at 89/75/7974  0:73  PM Serum Sodium: 135 mmol/L at 09/24/2024  9:26 PM Total Bilirubin: 4.8 mg/dL at 89/75/7974  0:73 PM Serum Albumin: 3.1 g/dL at 89/75/7974  0:73 PM INR(ratio): 1.6 at 09/24/2024  9:32 PM Age at listing (hypothetical): 38 years Sex: Male at 09/24/2024  9:32 PM    Studies/Results: CT ANGIO GI BLEED Result Date: 09/24/2024 CLINICAL DATA:  Hematemesis rectal bleeding EXAM: CTA ABDOMEN AND PELVIS WITHOUT AND WITH CONTRAST TECHNIQUE: Multidetector CT imaging of the abdomen and pelvis was performed using the standard protocol during bolus administration of intravenous contrast. Multiplanar reconstructed images and MIPs were obtained and reviewed to evaluate the vascular anatomy. RADIATION DOSE REDUCTION: This exam was performed according to the departmental dose-optimization program which includes automated exposure control, adjustment of the mA and/or kV according to patient size  and/or use of iterative reconstruction technique. CONTRAST:  75mL OMNIPAQUE  IOHEXOL  350 MG/ML SOLN COMPARISON:  MRI 01/27/2024 CT 01/27/2024, 06/19/2022 FINDINGS: VASCULAR Aorta: Normal caliber aorta without aneurysm, dissection, vasculitis or significant stenosis. Mild atherosclerosis. Celiac: Patent without evidence of aneurysm, dissection, vasculitis or significant stenosis. SMA: Patent without evidence of aneurysm, dissection, vasculitis or significant stenosis. Renals: Both renal arteries are patent without evidence of aneurysm, dissection, vasculitis, fibromuscular dysplasia or significant stenosis. IMA: Patent without evidence of aneurysm, dissection, vasculitis or significant stenosis. Inflow: Patent without evidence of aneurysm, dissection, vasculitis or significant stenosis. Proximal Outflow: Bilateral common femoral and visualized portions of the superficial and profunda femoral arteries are patent without evidence of aneurysm, dissection, vasculitis or significant stenosis. Veins: Patent portal and splenic veins. IVC is grossly unremarkable. Recanalized paraumbilical vein. Small gastroesophageal varices. Review of the MIP images confirms the above findings. NON-VASCULAR Lower chest: Lung bases are clear. Small hiatal hernia containing fluid, chronic. Hepatobiliary: Liver cirrhosis. Multiple hepatic nodules. Sludge and stones in the gallbladder. No biliary dilatation. Pancreas: Minimal fat stranding.  No ductal dilatation Spleen: Enlarged with AP measurement of 15 cm, previously 14.5 cm Adrenals/Urinary Tract: Adrenal glands are normal. Kidneys show no hydronephrosis. Slightly thick walled urinary bladder. Stomach/Bowel: Stomach within normal limits. Soft tissue stranding at the second and third portion of duodenum with trace fluid. No acute bowel wall thickening. Negative appendix Lymphatic: No suspicious lymph nodes Reproductive: Negative prostate Other: Negative for free air. No ascites. Trace  stranding and thickening in the anterior pararenal spaces. Musculoskeletal: No acute osseous abnormality. Chronic bilateral pars defect at L5 IMPRESSION: 1. Negative for acute aortic dissection or aneurysm. No evidence for intraluminal extravasation of contrast to suggest active GI bleeding on this exam. 2. Liver cirrhosis with evidence of portal hypertension including splenomegaly and small gastroesophageal varices. 3. Multiple hepatic nodules, better evaluated on recent MRI. 4. Mild fat stranding about the pancreas and second and third portion of duodenum, question mild pancreatitis or duodenitis. 5. Cholelithiasis and sludge within the gallbladder without right upper quadrant inflammation. 6. Slightly thick-walled urinary bladder, but decompressed. Question cystitis. 7. Aortic atherosclerosis. Aortic Atherosclerosis (ICD10-I70.0). Electronically Signed   By: Luke Bun M.D.   On: 09/24/2024 23:20    IMPRESSION/PLAN:  38 year old male with decompensated alcohol associated cirrhosis, grade 2 esophageal varices, portal hypertensive gastropathy and thrombocytopenia presents with UGI bleed, hematemesis and melena. Prior history of UGI bleed 06/2022 and 01/2024. MELD.3: 19. - NPO - EGD benefits and risks discussed including risk with sedation, risk of bleeding, perforation and infection. Bed side EGD today, patient will require intubation for airway protection. Await further recommendations per Dr. Legrand. - Continue Octreotide  infusion 50cmg/hr - PPI IV bid -  Ondansetron  4 mg  IV every 6 hours as needed - Continue Ceftriaxone  1 g IV daily for infection prophylaxis in setting of cirrhotic patient with GI bleed - Eventual liver MRI to further assess hepatic nodules - Transfuse for hemoglobin level less than 7 - CBC, BMP, hepatic panel, PT/INR and AFP in a.m.  Alcohol use disorder - CIWA protocol in process - Continue thiamine  IV daily - Patient counseled complete alcohol abstinence  imperative  History of esophageal and gastric ulcers  - See plan above   Normocytic anemia secondary to cirrhosis and acute GI blood loss - See plan above  Thrombocytopenia secondary to cirrhosis and splenomegaly  Coagulopathy.  INR 1.6. Received vitamin K 10 mg IV x 1. - INR in am  Lactic acidosis, received aggressive IV fluids - Management per the medical service  Asthma - Neb treatments per the medical service       Elida CHRISTELLA Shawl  09/25/2024, 8:29 AM  I have taken an interval history, thoroughly reviewed the chart and examined the patient. I agree with the Advanced Practitioner's note, impression and recommendations, and have recorded additional findings, impressions and recommendations below. I performed a substantive portion of this encounter (>50% time spent), including a complete performance of the medical decision making.  My additional thoughts are as follows:  Patient with a history of alcohol related cirrhosis, upper GI varices and duodenal ulcer here for hematemesis and melena with acute blood loss anemia, thrombocytopenia, coagulopathy and CTA negative for active bleeding with some questionable stranding in the duodenum.  No ascites he has been volume resuscitated, currently blood pressure is normal but still tachycardic in the 120s.  He is alert and conversational sitting up on the edge of the bed.  No apparent encephalopathy, cardiopulmonary exam notable for some expiratory wheezing, abdomen obese left lobe liver enlarged, no spleen tip palpable though limited by body habitus.  Overall clinical picture most concerning for variceal bleed.  Patient currently on IV PPI and octreotide  being cared for in the ICU.  Persistently tachycardic despite normalization of blood pressure, suggesting there could be some element of alcohol withdrawal as well.  He is in need of an upper endoscopy today.  Due to anesthesia/OR operational issues and availability today, this  will be done as a bedside case in the ICU.  Due to his history of substance abuse and his current clinical condition, he needs general anesthesia for sufficient sedation and upper airway protection.  I spoke with Dr. Claudene of the critical care team, and he is agreeable to elective intubation for the procedure.  I discussed all this with Mr. Soward along with detailed explanation of the risks and benefits of upper endoscopy and he is agreeable.  Naturally, he is familiar with this procedure from having had it done in 2023 and again earlier this year.  The benefits and risks of the planned procedure(s) were described in detail with the patient or (when appropriate) their health care proxy.  Risks were outlined as including, but not limited to, bleeding, infection, perforation, adverse medication reaction leading to cardiac or pulmonary decompensation, pancreatitis (if ERCP).  The limitation of incomplete mucosal visualization was also discussed.  No guarantees or warranties were given.  Patient at increased risk for cardiopulmonary complications of procedure due to medical comorbidities.  No current need for platelet transfusion or FFP.  Further recommendations following endoscopic procedure. _________________  This consultation required a high degree of medical decision making due to the nature and complexity of the  acute condition(s) being evaluated as well as the patient's medical comorbidities.  Victory LITTIE Brand III Office:[phone]

## 2024-09-25 NOTE — Progress Notes (Signed)
 Called to bedside to extubate pt. Pt w/ Vt~200cc's on 5/5, then began to desat into the 50s on 40% O2. FiO2 increased to 100% and pt placed back on full support for improved sats. Cont to monitor.

## 2024-09-25 NOTE — Op Note (Signed)
 [facility] Patient Name: Shane Holmes Procedure Date : 09/25/2024 MRN: [id number] Attending MD: Victory CROME. Legrand , MD, [id number] Date of Birth: [birth year] CSN: [id number] Age: 38 Admit Type: Inpatient Procedure:                Upper GI endoscopy Indications:              Acute post hemorrhagic anemia, Hematemesis, Melena Providers:                Victory L. Legrand, MD, Darleene Bare, RN, Felice Sar,                            Technician Referring MD:             Critcal Care service Medicines:                General Anesthesia (on vent in ICU) Complications:            No immediate complications. Estimated Blood Loss:     Estimated blood loss was minimal. Procedure:                Pre-Anesthesia Assessment:                           - Prior to the procedure, a History and Physical                            was performed, and patient medications and                            allergies were reviewed. [name] tolerance of                            previous anesthesia was also reviewed. The risks                            and benefits of the procedure and the sedation                            options and risks were discussed with the patient.                            All questions were answered, and informed consent                            was obtained. Prior Anticoagulants: The patient has                            taken no anticoagulant or antiplatelet agents. ASA                            Grade Assessment: IV - A patient with severe                            systemic disease that is a constant threat to life.  After reviewing the risks and benefits, the patient                            was deemed in satisfactory condition to undergo the                            procedure.                           After obtaining informed consent, the endoscope was                            passed under direct vision. Throughout the                             procedure, the patient's blood pressure, pulse, and                            oxygen saturations were monitored continuously. The                            [id number]-[id number] ([id number]) Olympus endoscope was                            introduced through the mouth, and advanced to the                            second part of duodenum. The [id number]-[id number] ([id number])                            Olympus endoscope was introduced through the and                            advanced to the. The upper GI endoscopy was                            accomplished without difficulty. The patient                            tolerated the procedure well. Scope In: Scope Out: Findings:      Grade II, large (> 5 mm) varices were found in the middle third of the       esophagus and in the lower third of the esophagus. No active bleeding,       multiple red wale signs seen. Six bands were successfully placed with       complete eradication, resulting in deflation of varices. Self-limited       post-banding oozing.      Two non-bleeding linear gastric ulcers with pigmented material were       found in the cardia. No active bleeding, no intervention taken. No       gastric Bx taken as patient is known to be H pylori negative from prior       exams.      Portal hypertensive gastropathy was found in the entire examined stomach.      Diffuse moderately  congested mucosa without active bleeding and with no       stigmata of bleeding was found in the entire duodenum. Mild friability. Impression:               - Grade II and large (> 5 mm) esophageal varices.                            Completely eradicated. Banded.                           - Non-bleeding gastric ulcers with pigmented                            material.                           - Portal hypertensive gastropathy.                           - Congested duodenal mucosa.                           - No specimens collected.                            Unclear if bleeding came from varices, linear                            proximal gastric ulcers, or both. Recommendation:           - Extubate when appropriate                           Clear liquid diet after extubated and                            awake/protecting airway                           Continue octreotide  drip at 50 mcg/hr and continue                            pantoprazole  40 mg IV BID                           Watch for EtOH withdrawal                           CBC this evening and tomorrow AM                           Family and critical care team updated Procedure Code(s):        --- Professional ---                           [phone], Esophagogastroduodenoscopy, flexible,  transoral; with band ligation of esophageal/gastric                            varices Diagnosis Code(s):        --- Professional ---                           I85.00, Esophageal varices without bleeding                           K25.9, Gastric ulcer, unspecified as acute or                            chronic, without hemorrhage or perforation                           K76.6, Portal hypertension                           K31.89, Other diseases of stomach and duodenum                           D62, Acute posthemorrhagic anemia                           K92.0, Hematemesis                           K92.1, Melena (includes Hematochezia) CPT copyright 2022 [employer]. All rights reserved. The codes documented in this report are preliminary and upon coder review may  be revised to meet current compliance requirements. Derin Granquist L. Legrand, MD 09/25/2024 3:02:14 PM This report has been signed electronically. Number of Addenda: 0

## 2024-09-25 NOTE — Anesthesia Preprocedure Evaluation (Signed)
 Anesthesia Evaluation    Reviewed: Allergy & Precautions, Patient's Chart, lab work & pertinent test results  Airway        Dental   Pulmonary asthma , Current Smoker          Cardiovascular negative cardio ROS      Neuro/Psych  PSYCHIATRIC DISORDERS      negative neurological ROS     GI/Hepatic ,GERD  Medicated,,(+) Cirrhosis     substance abuse    Endo/Other  diabetes, Oral Hypoglycemic Agents    Renal/GU negative Renal ROS     Musculoskeletal negative musculoskeletal ROS (+)    Abdominal   Peds  Hematology  (+) Blood dyscrasia, anemia Thrombocytopenia    Anesthesia Other Findings Hematemesis Cirrhosis Varices  Reproductive/Obstetrics                              Anesthesia Physical Anesthesia Plan  ASA: 4  Anesthesia Plan:    Post-op Pain Management:    Induction:   PONV Risk Score and Plan:   Airway Management Planned:   Additional Equipment:   Intra-op Plan:   Post-operative Plan:   Informed Consent:   Plan Discussed with:   Anesthesia Plan Comments:          Anesthesia Quick Evaluation

## 2024-09-26 ENCOUNTER — Encounter (HOSPITAL_COMMUNITY): Payer: Self-pay

## 2024-09-26 DIAGNOSIS — J45909 Unspecified asthma, uncomplicated: Secondary | ICD-10-CM

## 2024-09-26 DIAGNOSIS — K7030 Alcoholic cirrhosis of liver without ascites: Principal | ICD-10-CM

## 2024-09-26 LAB — HEPATIC FUNCTION PANEL
ALT: 12 U/L (ref 0–44)
AST: 53 U/L — ABNORMAL HIGH (ref 15–41)
Albumin: 2.5 g/dL — ABNORMAL LOW (ref 3.5–5.0)
Alkaline Phosphatase: 64 U/L (ref 38–126)
Bilirubin, Direct: 1.9 mg/dL — ABNORMAL HIGH (ref 0.0–0.2)
Indirect Bilirubin: 1.8 mg/dL — ABNORMAL HIGH (ref 0.3–0.9)
Total Bilirubin: 3.7 mg/dL — ABNORMAL HIGH (ref 0.0–1.2)
Total Protein: 5.8 g/dL — ABNORMAL LOW (ref 6.5–8.1)

## 2024-09-26 LAB — CBC
HCT: 21.2 % — ABNORMAL LOW (ref 39.0–52.0)
Hemoglobin: 7.4 g/dL — ABNORMAL LOW (ref 13.0–17.0)
MCH: 30.2 pg (ref 26.0–34.0)
MCHC: 34.9 g/dL (ref 30.0–36.0)
MCV: 86.5 fL (ref 80.0–100.0)
Platelets: 49 K/uL — ABNORMAL LOW (ref 150–400)
RBC: 2.45 MIL/uL — ABNORMAL LOW (ref 4.22–5.81)
RDW: 25.1 % — ABNORMAL HIGH (ref 11.5–15.5)
WBC: 6.7 K/uL (ref 4.0–10.5)
nRBC: 0 % (ref 0.0–0.2)

## 2024-09-26 LAB — GLUCOSE, CAPILLARY
Glucose-Capillary: 94 mg/dL (ref 70–99)
Glucose-Capillary: 90 mg/dL (ref 70–99)
Glucose-Capillary: 132 mg/dL — ABNORMAL HIGH (ref 70–99)
Glucose-Capillary: 118 mg/dL — ABNORMAL HIGH (ref 70–99)
Glucose-Capillary: 120 mg/dL — ABNORMAL HIGH (ref 70–99)

## 2024-09-26 LAB — BASIC METABOLIC PANEL WITH GFR
Anion gap: 13 (ref 5–15)
BUN: 10 mg/dL (ref 6–20)
CO2: 26 mmol/L (ref 22–32)
Calcium: 6.7 mg/dL — ABNORMAL LOW (ref 8.9–10.3)
Chloride: 100 mmol/L (ref 98–111)
Creatinine, Ser: 0.6 mg/dL — ABNORMAL LOW (ref 0.61–1.24)
GFR, Estimated: >60 mL/min (ref >60)
Glucose, Bld: 90 mg/dL (ref 70–99)
Potassium: 3.3 mmol/L — ABNORMAL LOW (ref 3.5–5.1)
Sodium: 139 mmol/L (ref 135–145)

## 2024-09-26 LAB — PROTIME-INR
INR: 1.5 — ABNORMAL HIGH (ref 0.8–1.2)
Prothrombin Time: 18.6 s — ABNORMAL HIGH (ref 11.4–15.2)

## 2024-09-26 MED ORDER — HYDROCODONE-ACETAMINOPHEN 7.5-325 MG PO TABS
1.0000 | ORAL_TABLET | Freq: Three times a day (TID) | ORAL | Status: DC
  Administered 2024-09-26 – 2024-09-27 (×4): 1 via ORAL
  Filled 2024-09-26 (×4): qty 1

## 2024-09-26 MED ORDER — ORAL CARE MOUTH RINSE: 15.0000 mL | OROMUCOSAL | Status: DC | PRN

## 2024-09-26 MED ORDER — SODIUM CHLORIDE 0.9 % IV SOLN
1.0000 g | INTRAVENOUS | Status: DC
  Administered 2024-09-27: 1 g via INTRAVENOUS
  Filled 2024-09-26: qty 10

## 2024-09-26 MED ORDER — IPRATROPIUM-ALBUTEROL 0.5-2.5 (3) MG/3ML IN SOLN: 3.0000 mL | Freq: Four times a day (QID) | RESPIRATORY_TRACT | Status: DC | PRN

## 2024-09-26 MED ORDER — THIAMINE MONONITRATE 100 MG PO TABS
100.0000 mg | ORAL_TABLET | Freq: Every day | ORAL | Status: DC
  Administered 2024-09-27: 100 mg via ORAL
  Filled 2024-09-26: qty 1

## 2024-09-26 MED ORDER — POLYETHYLENE GLYCOL 3350 17 G PO PACK
17.0000 g | PACK | Freq: Every day | ORAL | Status: DC
  Filled 2024-09-26: qty 1

## 2024-09-26 MED ORDER — FOLIC ACID 1 MG PO TABS
1.0000 mg | ORAL_TABLET | Freq: Every day | ORAL | Status: DC
  Administered 2024-09-27: 1 mg via ORAL
  Filled 2024-09-26: qty 1

## 2024-09-26 MED ORDER — HYDROMORPHONE HCL 1 MG/ML IJ SOLN
1.0000 mg | INTRAMUSCULAR | Status: DC | PRN
  Administered 2024-09-26 (×2): 1 mg via INTRAVENOUS
  Administered 2024-09-26 – 2024-09-27 (×4): 2 mg via INTRAVENOUS
  Filled 2024-09-26: qty 2
  Filled 2024-09-26: qty 1
  Filled 2024-09-26 (×4): qty 2

## 2024-09-26 MED ORDER — POTASSIUM CHLORIDE CRYS ER 20 MEQ PO TBCR
40.0000 meq | EXTENDED_RELEASE_TABLET | ORAL | Status: AC
  Administered 2024-09-26 (×2): 40 meq via ORAL
  Filled 2024-09-26 (×2): qty 2

## 2024-09-26 NOTE — Progress Notes (Signed)
 [facility] ADULT ICU REPLACEMENT PROTOCOL   The patient does apply for the [facility] Adult ICU Electrolyte Replacment Protocol based on the criteria listed below:   1.Exclusion criteria: TCTS, ECMO, Dialysis, and Myasthenia Gravis patients 2. Is GFR >/= 30 ml/min? Yes.    Patient's GFR today is >60 3. Is SCr </= 2? Yes.   Patient's SCr is 0.60 mg/dL 4. Did SCr increase >/= 0.5 in 24 hours? No. 5.Pt's weight >40kg  Yes.   6. Abnormal electrolyte(s): K+ 3.3  7. Electrolytes replaced per protocol 8.  Call MD STAT for K+ </= 2.5, Phos </= 1, or Mag </= 1 Physician:  Dr Mallory Breed  Henry Recardo ORN 09/26/2024 6:27 AM

## 2024-09-26 NOTE — Progress Notes (Signed)
 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  He  also c/o weakness.  Says he has a stomach ulcer, does not know about esophageal varices.  Says he has cut down on his drinking a lot this year.  He says he is not drinking hard liquor anymore, only beer. In ED his BP was relatively low and received 1 liter fluid bolus, Octreotide  started and Vitamin K, and to get 1 unit PRBC.  GI has been consulted. AG 27, WBC 17.7, HgB 8.3, PLT 71, INR 1.6, ETOH level 110. CT abd showing:  Negative for acute aortic dissection or aneurysm. No evidence for intraluminal extravasation of contrast to suggest active GI bleeding on this exam. 2. Liver cirrhosis with evidence of portal hypertension including splenomegaly and small gastroesophageal varices. 3. Multiple hepatic nodules, better evaluated on recent MRI. 4. Mild fat stranding about the pancreas and second and third portion of duodenum, question mild pancreatitis or duodenitis. 5. Cholelithiasis and sludge within the gallbladder without right upper quadrant inflammation. 6. Slightly thick-walled urinary bladder, but decompressed. Question cystitis. 7. Aortic atherosclerosis.  Aortic Atherosclerosis (ICD10-I70.0).   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 Tolerating diet, mobilizing + stool nonbloody  Objective    Blood pressure 107/70, pulse (!) 108, temperature 98.1 F (36.7 C), temperature source Oral, resp. rate 11, height 5' 11 (1.803 m), weight 87.8 kg, SpO2 90%.    Vent Mode: PRVC FiO2 (%):  [40 %-50 %] 50 % Set Rate:  [12 bmp-15 bmp] 15 bmp Vt Set:  [500 mL-600 mL] 600 mL PEEP:  [5 cmH20] 5 cmH20 Plateau Pressure:  [17 cmH20-18 cmH20] 17 cmH20   Intake/Output Summary (Last 24 hours) at 09/26/2024 0941 Last data filed at 09/26/2024 0800 Gross per 24 hour  Intake 1364.83 ml  Output 400 ml  Net 964.83 ml   Filed Weights   09/24/24 2101 09/25/24 0800 09/26/24 0738  Weight: 92 kg 70.4 kg 87.8 kg    Examination: No distress Moves to command Mild ttp over epigastrum RASS 0 Wheezing mostly resolved  Resolved problem list   Assessment and Plan  UGIB- some varices but no clear bleeding, banded; also some gastric ulcerations also no obvious place to intervene on; seems to have settled out, no s/s of ongoing bleeding ETOH abuse- reduced but not gone Portal HTN AGMA Asthma- did not do great post intubation with bronchospasm but settled out with a duoneb and some bagging Propofol  emergence associated myoclonus- resolved H/o Pre-DM Chronic pain, lumbar issues Thiamine  deficiency with peripheral neuropathy  - CBC tomorrow - PPI and octreotide  per GI - Abx x 5 days for SBP ppx - Thiamine  as ordered, refusing folate for some reason - Advance diet to soft -  Adding back home pain meds (norco, gabapentin , baclofen ), adding dilaudid  for breakthrough - Push mobility - Duonebs PRN - Home when cleared by GI, tolerating diet, and pain under control - Okay for med surg, dc tele, dc pulse ox, dc CBG  Rolan Sharps MD PCCM

## 2024-09-26 NOTE — Evaluation (Signed)
 RT Evaluate and Treat Note  09/26/2024   Breathing is (select one): Same as normal    The following was found on auscultation (select multiple):  Bilateral Breath Sounds: Clear;Diminished (09/26/24 0800)             Cough Assessment: Cough: Strong (09/25/24 2000)    Most Recent Chest Xray:... (No results found.    The following medications and/or interventions were ordered/changed/discontinued as part of the Respiratory Treatment protocol:   Medication Changes: Albuterol  PRN   Airway Clearance Changes: none   Oxygen Therapy Changes:none

## 2024-09-26 NOTE — Progress Notes (Addendum)
 [facility] Progress Note  CC:  UGI bleed, hematemesis and dark stools   Subjective: He endorses having mid chest/esophageal pain since undergoing an EGD with esophageal banding yesterday, controlled on Dilaudid . He tolerated a clear liquid diet, advanced to a soft diet this morning. No N/V. No abdominal pain. Passed a small formed brown stool this morning. No melena or rectal bleeding.    Objective:   EGD 09/25/2024: - Grade II and large (> 5 mm) esophageal varices. Completely eradicated. Banded. - Non-bleeding gastric ulcers with pigmented material.  - Portal hypertensive gastropathy.  - Congested duodenal mucosa.  - No specimens collected. Unclear if bleeding came from varices, linear proximal gastric ulcers, or both.   Vital signs in last 24 hours: Temp:  [98 F (36.7 C)-98.4 F (36.9 C)] 98.1 F (36.7 C) (10/26 0732) Pulse Rate:  [102-148] 108 (10/26 0800) Resp:  [9-24] 11 (10/26 0800) BP: (91-184)/(50-116) 107/70 (10/26 0800) SpO2:  [89 %-100 %] 90 % (10/26 0800) FiO2 (%):  [40 %-50 %] 50 % (10/25 1514) Weight:  [87.8 kg] 87.8 kg (10/26 0738) Last BM Date : 09/25/24 General: Alert 38 year old male in NAD. Eyes: Scleral icterus present.  Heart: RRR, no murmurs.  Pulm: Breath sounds clear throughout.  Abdomen: Protuberant obese abdomen with mild generalized tenderness.  + Hepatosplenomegaly. No ascites/fluid wave assessed.  Positive bowel sounds to all 4 quadrants.  No palpable mass.  Extremities: No lower extremity edema. Neurologic:  Alert and oriented x 4. Speech is clear. Moves all extremities. Hands mildly tremulous without asterixis.  Psych:  Alert and cooperative. Normal mood and affect.  Intake/Output from previous day: 10/25 0701 - 10/26 0700 In: 2974.9 [I.V.:1634; IV Piggyback:1100.9] Out: 400 [Urine:400] Intake/Output this shift: Total I/O In: 99.9 [I.V.:99.9] Out: -   Lab Results: Recent Labs    09/25/24 1736 09/25/24 2305  09/26/24 0339  WBC 9.0 7.2 6.7  HGB 7.8* 7.0* 7.4*  HCT 22.9* 20.8* 21.2*  PLT 50* 46* 49*   BMET Recent Labs    09/24/24 2126 09/26/24 0339  NA 135 139  K 3.5 3.3*  CL 93* 100  CO2 15* 26  GLUCOSE 74 90  BUN 11 10  CREATININE 0.79 0.60*  CALCIUM  7.9* 6.7*   LFT Recent Labs    09/26/24 0339  PROT 5.8*  ALBUMIN 2.5*  AST 53*  ALT 12  ALKPHOS 64  BILITOT 3.7*  BILIDIR 1.9*  IBILI 1.8*   PT/INR Recent Labs    09/24/24 2132 09/26/24 0339  LABPROT 20.0* 18.6*  INR 1.6* 1.5*   Hepatitis Panel No results for input(s): HEPBSAG, HCVAB, HEPAIGM, HEPBIGM in the last 72 hours.  CT ANGIO GI BLEED Result Date: 09/24/2024 CLINICAL DATA:  Hematemesis rectal bleeding EXAM: CTA ABDOMEN AND PELVIS WITHOUT AND WITH CONTRAST TECHNIQUE: Multidetector CT imaging of the abdomen and pelvis was performed using the standard protocol during bolus administration of intravenous contrast. Multiplanar reconstructed images and MIPs were obtained and reviewed to evaluate the vascular anatomy. RADIATION DOSE REDUCTION: This exam was performed according to the departmental dose-optimization program which includes automated exposure control, adjustment of the mA and/or kV according to patient size and/or use of iterative reconstruction technique. CONTRAST:  75mL OMNIPAQUE  IOHEXOL  350 MG/ML SOLN COMPARISON:  MRI 01/27/2024 CT 01/27/2024, 06/19/2022 FINDINGS: VASCULAR Aorta: Normal caliber aorta without aneurysm, dissection, vasculitis or significant stenosis. Mild atherosclerosis. Celiac: Patent without evidence of aneurysm, dissection, vasculitis or significant stenosis. SMA: Patent without evidence of aneurysm, dissection, vasculitis  or significant stenosis. Renals: Both renal arteries are patent without evidence of aneurysm, dissection, vasculitis, fibromuscular dysplasia or significant stenosis. IMA: Patent without evidence of aneurysm, dissection, vasculitis or significant stenosis. Inflow:  Patent without evidence of aneurysm, dissection, vasculitis or significant stenosis. Proximal Outflow: Bilateral common femoral and visualized portions of the superficial and profunda femoral arteries are patent without evidence of aneurysm, dissection, vasculitis or significant stenosis. Veins: Patent portal and splenic veins. IVC is grossly unremarkable. Recanalized paraumbilical vein. Small gastroesophageal varices. Review of the MIP images confirms the above findings. NON-VASCULAR Lower chest: Lung bases are clear. Small hiatal hernia containing fluid, chronic. Hepatobiliary: Liver cirrhosis. Multiple hepatic nodules. Sludge and stones in the gallbladder. No biliary dilatation. Pancreas: Minimal fat stranding.  No ductal dilatation Spleen: Enlarged with AP measurement of 15 cm, previously 14.5 cm Adrenals/Urinary Tract: Adrenal glands are normal. Kidneys show no hydronephrosis. Slightly thick walled urinary bladder. Stomach/Bowel: Stomach within normal limits. Soft tissue stranding at the second and third portion of duodenum with trace fluid. No acute bowel wall thickening. Negative appendix Lymphatic: No suspicious lymph nodes Reproductive: Negative prostate Other: Negative for free air. No ascites. Trace stranding and thickening in the anterior pararenal spaces. Musculoskeletal: No acute osseous abnormality. Chronic bilateral pars defect at L5 IMPRESSION: 1. Negative for acute aortic dissection or aneurysm. No evidence for intraluminal extravasation of contrast to suggest active GI bleeding on this exam. 2. Liver cirrhosis with evidence of portal hypertension including splenomegaly and small gastroesophageal varices. 3. Multiple hepatic nodules, better evaluated on recent MRI. 4. Mild fat stranding about the pancreas and second and third portion of duodenum, question mild pancreatitis or duodenitis. 5. Cholelithiasis and sludge within the gallbladder without right upper quadrant inflammation. 6. Slightly  thick-walled urinary bladder, but decompressed. Question cystitis. 7. Aortic atherosclerosis. Aortic Atherosclerosis (ICD10-I70.0). Electronically Signed   By: Luke Bun M.D.   On: 09/24/2024 23:20   Patient Profile: Shane Holmes is a 38 y.o. male with a past medical history of asthma, DM type II, peripheral neuropathy, chronic back pain, thiamine  deficiency, esophageal ulcer, duodenal ulcers, alcohol use disorder with cirrhosis, grade 2 esophageal varices with GI bleed 06/20/2022 and 01/22/2024, portal hypertensive gastropathy, thrombocytopenia and anemia. Admitted 09/20/2024 with hematemesis and dark stools.  Assessment / Plan:  38 year old male with decompensated alcohol associated cirrhosis, grade 2 esophageal varices, portal hypertensive gastropathy and thrombocytopenia presents with UGI bleed, hematemesis and melena. Prior history of UGI bleed 06/2022 and 01/2024. MELD.3: 19.  Total bili 1.8.  Alk phos 64.  AST 53.  ALT 12.  S/P EGD 10/25 identified large grade 2 esophageal varices banded, completely eradicated, nonbleeding gastric ulcers portal hypertensive gastropathy and congested duodenal mucosa.  Patient endorses having post procedure esophageal/chest pain which is not unexpected following esophageal banding.  No further hematemesis.  Passed a brown BM earlier this morning.  Hemodynamically stable. - Continue Octreotide  infusion 50cmg/hr for 24 hours (48 hours post EGD) - Soft diet  - Continue PPI IV bid -Pain management per CCM - Ondansetron  4 mg  IV every 6 hours as needed - Continue Ceftriaxone  1 g IV daily for infection prophylaxis in setting of cirrhotic patient with GI bleed - Eventual liver MRI to further assess hepatic nodules - Transfuse for hemoglobin level less than 7 - CBC, BMP, hepatic panel, PT/INR    Alcohol use disorder - CIWA protocol in process - Continue thiamine  IV daily - Patient counseled complete alcohol abstinence imperative   History of esophageal and  gastric ulcers  -  See plan above    Normocytic anemia secondary to cirrhosis and acute GI blood loss. Hg 7.0 -> 7.4.  - Transfuse for hemoglobin level less than 7 - CBC in a.m.   Thrombocytopenia secondary to cirrhosis and splenomegaly. PLT 48.   Coagulopathy.  INR 1.6. Received vitamin K 10 mg IV x 1 -> INR 1.5.  - INR in am   Lactic acidosis, received aggressive IV fluids - Management per the medical service  Hypokalemia. K+ 3.3.  - KCl replacement per CCM  Hypocalcemia. Calcium  level 6.7. - Manage per CCM   Asthma - Neb treatments per the medical service    Principal Problem:   Acute upper GI bleed Active Problems:   Secondary esophageal varices with bleeding (HCC)     LOS: 2 days   Elida CHRISTELLA Shawl  09/26/2024, 12:06 PM   I have taken an interval history, thoroughly reviewed the chart and examined the patient. I agree with the Advanced Practitioner's note, impression and recommendations, and have recorded additional findings, impressions and recommendations below. I performed a substantive portion of this encounter (>50% time spent), including a complete performance of the medical decision making.  My additional thoughts are as follows:  Clinically stable, no recurrence of bleeding since yesterday's endoscopy. He is having some post banding chest pain that is common and expected to resolve.  Judicious use of pain medicine for that in this patient with history of substance abuse.  Hemoglobin stable since yesterday.  Acute blood loss anemia on top of anemia chronic disease from cirrhosis and ongoing alcohol abuse. He would benefit from oral iron at the time of discharge.  He can be transferred out of the ICU and there is a bed available.  Continue octreotide  until tomorrow morning, then discontinue.  Will need Coreg around time of discharge or shortly afterwards for secondary variceal bleeding prophylaxis.  Our team will see him tomorrow.  If he is well  and stable, possibly discharge home tomorrow for outpatient follow-up with me.  He will need office follow-up and a subsequent EGD for reassessment of varices and possible additional banding if needed.  He is again strongly advised to be completely abstinent from alcohol.  His fiance was at the bedside and says she will do her best to keep him on the straight path. _________________  This consultation required a moderate degree of medical decision making due to the nature and complexity of the acute condition(s) being evaluated as well as the patient's medical comorbidities.  Victory LITTIE Brand III Office:[phone]

## 2024-09-27 ENCOUNTER — Encounter (HOSPITAL_COMMUNITY): Payer: Self-pay | Admitting: Gastroenterology

## 2024-09-27 ENCOUNTER — Other Ambulatory Visit (HOSPITAL_COMMUNITY): Payer: Self-pay

## 2024-09-27 DIAGNOSIS — K922 Gastrointestinal hemorrhage, unspecified: Secondary | ICD-10-CM

## 2024-09-27 LAB — CBC
HCT: 22.6 % — ABNORMAL LOW (ref 39.0–52.0)
Hemoglobin: 7.5 g/dL — ABNORMAL LOW (ref 13.0–17.0)
MCH: 29.8 pg (ref 26.0–34.0)
MCHC: 33.2 g/dL (ref 30.0–36.0)
MCV: 89.7 fL (ref 80.0–100.0)
Platelets: 57 K/uL — ABNORMAL LOW (ref 150–400)
RBC: 2.52 MIL/uL — ABNORMAL LOW (ref 4.22–5.81)
RDW: 25.6 % — ABNORMAL HIGH (ref 11.5–15.5)
WBC: 7.7 K/uL (ref 4.0–10.5)
nRBC: 0 % (ref 0.0–0.2)

## 2024-09-27 LAB — BASIC METABOLIC PANEL WITH GFR
Anion gap: 10 (ref 5–15)
BUN: 13 mg/dL (ref 6–20)
CO2: 26 mmol/L (ref 22–32)
Calcium: 6.9 mg/dL — ABNORMAL LOW (ref 8.9–10.3)
Chloride: 101 mmol/L (ref 98–111)
Creatinine, Ser: 0.74 mg/dL (ref 0.61–1.24)
GFR, Estimated: >60 mL/min (ref >60)
Glucose, Bld: 98 mg/dL (ref 70–99)
Potassium: 4 mmol/L (ref 3.5–5.1)
Sodium: 137 mmol/L (ref 135–145)

## 2024-09-27 LAB — GLUCOSE, CAPILLARY
Glucose-Capillary: 114 mg/dL — ABNORMAL HIGH (ref 70–99)
Glucose-Capillary: 116 mg/dL — ABNORMAL HIGH (ref 70–99)
Glucose-Capillary: 98 mg/dL (ref 70–99)

## 2024-09-27 MED ORDER — CARVEDILOL 3.125 MG PO TABS
3.1250 mg | ORAL_TABLET | Freq: Two times a day (BID) | ORAL | 0 refills | Status: AC
  Filled 2024-09-27: qty 60, 30d supply, fill #0

## 2024-09-27 MED ORDER — THIAMINE HCL 100 MG PO TABS
100.0000 mg | ORAL_TABLET | Freq: Every day | ORAL | 0 refills | Status: AC
  Filled 2024-09-27: qty 30, 30d supply, fill #0

## 2024-09-27 MED ORDER — FOLIC ACID 1 MG PO TABS
1.0000 mg | ORAL_TABLET | Freq: Every day | ORAL | 0 refills | Status: AC
  Filled 2024-09-27: qty 30, 30d supply, fill #0

## 2024-09-27 NOTE — Discharge Summary (Signed)
 Physician Discharge Summary  Shane Holmes FMW:[id number] DOB: 22-Apr-1986 DOA: 09/24/2024  PCP: Claudene Jackee DASEN, PA-C  Admit date: 09/24/2024 Discharge date: 09/27/2024 Recommendations for Outpatient Follow-up:  Follow up with PCP in 1 weeks-call for appointment Please obtain BMP/CBC in one week Follow-up with gastroenterology patient Please abstain from alcohol/beer.  Discharge Dispo: Home Discharge Condition: Stable Code Status:   Code Status: Full Code Diet recommendation:  Diet Order             DIET DYS 3 Room service appropriate? Yes; Fluid consistency: Thin  Diet effective now                    Brief/Interim Summary: Shane Holmes is a 38 y.o. 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 In ED:BP was relatively low and received 1 liter fluid bolus, Octreotide  started and Vitamin K, and to get 1 unit PRBC.  GI has been consulted. AG 27, WBC 17.7, HgB 8.3, PLT 71, INR 1.6, ETOH level 110. CT abd showing: Negative for acute aortic dissection or aneurysm. No evidence for intraluminal extravasation of contrast to suggest active GI bleeding on this exam. Liver cirrhosis with evidence of portal hypertension including splenomegaly and small gastroesophageal varices Multiple hepatic nodules, better evaluated on recent MRI.Mild fat stranding about the pancreas and second and third portion of duodenum, question mild pancreatitis or duodenitis. Cholelithiasis and sludge within the gallbladder without right upper quadrant inflammation.Slightly thick-walled urinary bladder, but decompressed. Question cystitis. Patient was admitted to ICU 10/25 s/p rough time with intubation for EGD due to bronchospasm and propofol  myoclonus during emergence but settled out and  extubated after EGD/banding. 10/27 transferred to TRH service Patient is tolerating diet, hemoglobin overall stable of 7 g, at this time GI has recommended to discharge patient home on iron supplement PPI and his beta-blocker  Subjective: Seen and examined today Overnight on room air afebrile, VSS, Labs reviewed hemoglobin 7.5 from 7.4 stable BMP-although calcium  low 6.9 similar as before in the setting of alignment 2.5  Discharge Diagnoses:   Upper GI bleed Esophageal varices History of esophageal and gastric ulcers Normocytic anemia due to liver cirrhosis and acute GI blood loss Duodenopathy Abnormal duodenal/pancreatic findings on CT angio: GI following underwent banding some varices but not clear bleeding.  Some gastric ulceration also no obvious place to intervene overall has stabilized. Continue to trend hemoglobin-which has been stable.  Seen by GI this morning, patient tolerated diet, AFP normal, MDF 26.9 on admission with good prognosis, plans for outpatient respiratory check as outpatient continue twice daily PPI which he has 3 refills on and okay to discharge home.  Patient is eager to go home.  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  Monitor platelet and INR.  Alcohol abuse at risk of withdrawal: Continue thiamine  folate CIWA protocol  Hypocalcemia: In the setting of hypoalbuminemia.  Monitor  AGMA Lactic acidosis: In the setting of liver cirrhosis upper GI bleed.  Alcoholic liver cirrhosis with portal hypertension/splenomegaly/gastroesophageal varices, hepatic nodules: Continue  to encourage alcohol cessation.  GI following  Chronic pain/lumbar issues: Continue multimodal pain management-baclofen  and Norco gabapentin   Peripheral  neuropathy Thiamine  deficiency: Replace  Asthma Stable  DVT prophylaxis: SCDs Start: 09/24/24 2334 Code Status:   Code Status: Full Code Family Communication: plan of care discussed with patient at bedside. Patient status is: Remains hospitalized because of severity of illness Level of care: Med-Surg   Dispo: The patient is from: home            Anticipated disposition: home Objective: Vitals last 24 hrs: Vitals:   09/26/24 1513 09/26/24 1700 09/26/24 2100 09/27/24 0751  BP:  109/76 106/79 131/80  Pulse:  (!) 102 (!) 104 (!) 101  Resp:  16 16 16   Temp: 98.1 F (36.7 C) 98.1 F (36.7 C) 98.1 F (36.7 C) 98.4 F (36.9 C)  TempSrc: Oral Oral Axillary Oral  SpO2:  97% 95% 97%  Weight:      Height:        Physical Examination: General exam: alert awake, oriented, older than stated age HEENT:Oral mucosa moist, Ear/Nose WNL grossly Respiratory system: Bilaterally clear BS,no use of accessory muscle Cardiovascular system: S1 & S2 +, No JVD. Gastrointestinal system: Abdomen soft,NT,ND, BS+ Nervous System: Alert, awake, moving all extremities,and following commands. Extremities: extremities warm, leg edema negSkin: Warm, no rashes MSK: Normal muscle bulk,tone, power   Procedure(s) (LRB): EGD (ESOPHAGOGASTRODUODENOSCOPY) (N/A)  Consultation: See note.  Discharge Instructions  Discharge Instructions     Discharge instructions   Complete by: As directed    Follow-up with the gastroenterology clinic  Please call call MD or return to ER for similar or worsening recurring problem that brought you to hospital or if any fever,nausea/vomiting,abdominal pain, uncontrolled pain, chest pain,  shortness of breath or any other alarming symptoms.  Please follow-up your doctor as instructed in a week time and call the office for appointment.  Please avoid alcohol, smoking, or any other illicit substance and maintain healthy habits including taking your regular medications as  prescribed.  You were cared for by a hospitalist during your hospital stay. If you have any questions about your discharge medications or the care you received while you were in the hospital after you are discharged, you can call the unit and ask to speak with the hospitalist on call if the hospitalist that took care of you is not available.  Once you are discharged, your primary care physician will handle any further medical issues. Please note that NO REFILLS for any discharge medications will be authorized once you are discharged, as it is imperative that you return to your primary care physician (or establish a relationship with a primary care physician if you do not have one) for your aftercare needs so that they can reassess your need for medications and monitor your lab values   Increase activity slowly   Complete by: As directed       Allergies as of 09/27/2024       Reactions   Lidocaine -glycerin Other (See Comments)   Redness to application site   Propofol     Gets pretty bad propofol  shakes on emergence        Medication List     STOP taking these medications    amoxicillin -clavulanate 875-125 MG tablet Commonly known as: AUGMENTIN    nadolol  20 MG tablet Commonly known as: CORGARD        TAKE these medications    albuterol  108 (90 Base) MCG/ACT inhaler Commonly known as: VENTOLIN  HFA INHALE 2  PUFFS INTO THE LUNGS EVERY SIX HOURS AS NEEDED FOR WHEEZING OR SHORTNESS OF BREATH.   ascorbic acid  500 MG tablet Commonly known as: VITAMIN C Take 500 mg by mouth daily.   baclofen  10 MG tablet Commonly known as: LIORESAL  Take 10 mg by mouth 2 (two) times daily.   carvedilol 3.125 MG tablet Commonly known as: Coreg Take 1 tablet (3.125 mg total) by mouth 2 (two) times daily.   ferrous sulfate 325 (65 FE) MG tablet Take 325 mg by mouth daily.   folic acid  1 MG tablet Commonly known as: FOLVITE  Take 1 tablet (1 mg total) by mouth daily. Start taking on: September 28, 2024   gabapentin  100 MG capsule Commonly known as: Neurontin  Take 100 mg tablet three times daily with 800 mg tablet three times daily for a total of 900 mg total three times daily   gabapentin  800 MG tablet Commonly known as: NEURONTIN  Take 100 mg tablet three times daily with 800 mg tablet three times daily for a total of 900 mg total three times daily   HYDROcodone -acetaminophen  7.5-325 MG tablet Commonly known as: NORCO Take 1 tablet by mouth 3 (three) times daily as needed. What changed: when to take this   loratadine 10 MG tablet Commonly known as: CLARITIN Take 10 mg by mouth daily.   metFORMIN  500 MG tablet Commonly known as: GLUCOPHAGE  Take 1 tablet (500 mg total) by mouth 2 (two) times daily with a meal.   pantoprazole  40 MG tablet Commonly known as: PROTONIX  TAKE 1 TABLET BY MOUTH TWICE A DAY   thiamine  100 MG tablet Commonly known as: VITAMIN B1 Take 1 tablet (100 mg total) by mouth daily.   thiamine  100 MG tablet Commonly known as: Vitamin B-1 Take 1 tablet (100 mg total) by mouth daily. Start taking on: September 28, 2024        Follow-up Information     Claudene Jackee DASEN, PA-C Follow up.   Specialty: Family Medicine Contact information: [address] [place] [address] [phone]                Allergies  Allergen Reactions   Lidocaine -Glycerin Other (See Comments)    Redness to application site   Propofol      Gets pretty bad propofol  shakes on emergence    The results of significant diagnostics from this hospitalization (including imaging, microbiology, ancillary and laboratory) are listed below for reference.    Microbiology: No results found for this or any previous visit (from the past 240 hours).  Procedures/Studies: CT ANGIO GI BLEED Result Date: 09/24/2024 CLINICAL DATA:  Hematemesis rectal bleeding EXAM: CTA ABDOMEN AND PELVIS WITHOUT AND WITH CONTRAST TECHNIQUE: Multidetector CT imaging of the abdomen and  pelvis was performed using the standard protocol during bolus administration of intravenous contrast. Multiplanar reconstructed images and MIPs were obtained and reviewed to evaluate the vascular anatomy. RADIATION DOSE REDUCTION: This exam was performed according to the departmental dose-optimization program which includes automated exposure control, adjustment of the mA and/or kV according to patient size and/or use of iterative reconstruction technique. CONTRAST:  75mL OMNIPAQUE  IOHEXOL  350 MG/ML SOLN COMPARISON:  MRI 01/27/2024 CT 01/27/2024, 06/19/2022 FINDINGS: VASCULAR Aorta: Normal caliber aorta without aneurysm, dissection, vasculitis or significant stenosis. Mild atherosclerosis. Celiac: Patent without evidence of aneurysm, dissection, vasculitis or significant stenosis. SMA: Patent without evidence of aneurysm, dissection, vasculitis or significant stenosis. Renals: Both renal arteries are patent without evidence of aneurysm, dissection, vasculitis, fibromuscular dysplasia or significant stenosis. IMA: Patent  without evidence of aneurysm, dissection, vasculitis or significant stenosis. Inflow: Patent without evidence of aneurysm, dissection, vasculitis or significant stenosis. Proximal Outflow: Bilateral common femoral and visualized portions of the superficial and profunda femoral arteries are patent without evidence of aneurysm, dissection, vasculitis or significant stenosis. Veins: Patent portal and splenic veins. IVC is grossly unremarkable. Recanalized paraumbilical vein. Small gastroesophageal varices. Review of the MIP images confirms the above findings. NON-VASCULAR Lower chest: Lung bases are clear. Small hiatal hernia containing fluid, chronic. Hepatobiliary: Liver cirrhosis. Multiple hepatic nodules. Sludge and stones in the gallbladder. No biliary dilatation. Pancreas: Minimal fat stranding.  No ductal dilatation Spleen: Enlarged with AP measurement of 15 cm, previously 14.5 cm  Adrenals/Urinary Tract: Adrenal glands are normal. Kidneys show no hydronephrosis. Slightly thick walled urinary bladder. Stomach/Bowel: Stomach within normal limits. Soft tissue stranding at the second and third portion of duodenum with trace fluid. No acute bowel wall thickening. Negative appendix Lymphatic: No suspicious lymph nodes Reproductive: Negative prostate Other: Negative for free air. No ascites. Trace stranding and thickening in the anterior pararenal spaces. Musculoskeletal: No acute osseous abnormality. Chronic bilateral pars defect at L5 IMPRESSION: 1. Negative for acute aortic dissection or aneurysm. No evidence for intraluminal extravasation of contrast to suggest active GI bleeding on this exam. 2. Liver cirrhosis with evidence of portal hypertension including splenomegaly and small gastroesophageal varices. 3. Multiple hepatic nodules, better evaluated on recent MRI. 4. Mild fat stranding about the pancreas and second and third portion of duodenum, question mild pancreatitis or duodenitis. 5. Cholelithiasis and sludge within the gallbladder without right upper quadrant inflammation. 6. Slightly thick-walled urinary bladder, but decompressed. Question cystitis. 7. Aortic atherosclerosis. Aortic Atherosclerosis (ICD10-I70.0). Electronically Signed   By: Luke Bun M.D.   On: 09/24/2024 23:20    Labs: BNP (last 3 results) No results for input(s): BNP in the last 8760 hours. Basic Metabolic Panel: Recent Labs  Lab 09/24/24 2126 09/26/24 0339 09/27/24 0453  NA 135 139 137  K 3.5 3.3* 4.0  CL 93* 100 101  CO2 15* 26 26  GLUCOSE 74 90 98  BUN 11 10 13   CREATININE 0.79 0.60* 0.74  CALCIUM  7.9* 6.7* 6.9*   Liver Function Tests: Recent Labs  Lab 09/24/24 2126 09/26/24 0339  AST 76* 53*  ALT 14 12  ALKPHOS 97 64  BILITOT 4.8* 3.7*  PROT 7.2 5.8*  ALBUMIN 3.1* 2.5*   Recent Labs  Lab 09/24/24 2132  LIPASE 50   Recent Labs  Lab 09/24/24 2132  AMMONIA 77*    CBC: Recent Labs  Lab 09/25/24 1025 09/25/24 1736 09/25/24 2305 09/26/24 0339 09/27/24 0453  WBC 7.1 9.0 7.2 6.7 7.7  HGB 7.5* 7.8* 7.0* 7.4* 7.5*  HCT 22.0* 22.9* 20.8* 21.2* 22.6*  MCV 84.9 87.4 86.0 86.5 89.7  PLT 44* 50* 46* 49* 57*   CBG: Recent Labs  Lab 09/26/24 1512 09/26/24 2108 09/27/24 0005 09/27/24 0750 09/27/24 1147  GLUCAP 118* 120* 116* 114* 98   Hgb A1c Recent Labs    09/25/24 0056  HGBA1C 5.0  No results for input(s): TSH, T4TOTAL, T3FREE, THYROIDAB in the last 72 hours.  Invalid input(s): FREET3 Urinalysis    Component Value Date/Time   COLORURINE YELLOW 10/21/2022 1735   APPEARANCEUR CLEAR 10/21/2022 1735   LABSPEC 1.025 10/21/2022 1735   PHURINE 9.0 (H) 10/21/2022 1735   GLUCOSEU >=500 (A) 10/21/2022 1735   HGBUR NEGATIVE 10/21/2022 1735   BILIRUBINUR NEGATIVE 10/21/2022 1735   KETONESUR 20 (A) 10/21/2022 1735  PROTEINUR 30 (A) 10/21/2022 1735   NITRITE NEGATIVE 10/21/2022 1735   LEUKOCYTESUR NEGATIVE 10/21/2022 1735   Sepsis Labs Recent Labs  Lab 09/25/24 1736 09/25/24 2305 09/26/24 0339 09/27/24 0453  WBC 9.0 7.2 6.7 7.7   Microbiology No results found for this or any previous visit (from the past 240 hours).  Time coordinating discharge: 35 minutes  SIGNED: Mennie LAMY, MD  [facility] 09/27/2024, 1:38 PM  If 7PM-7AM, please contact night-coverage [email or web address]

## 2024-09-27 NOTE — Hospital Course (Addendum)
 Shane Holmes is a 38 y.o. 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 In ED:BP was relatively low and received 1 liter fluid bolus, Octreotide  started and Vitamin K, and to get 1 unit PRBC.  GI has been consulted. AG 27, WBC 17.7, HgB 8.3, PLT 71, INR 1.6, ETOH level 110. CT abd showing: Negative for acute aortic dissection or aneurysm. No evidence for intraluminal extravasation of contrast to suggest active GI bleeding on this exam. Liver cirrhosis with evidence of portal hypertension including splenomegaly and small gastroesophageal varices Multiple hepatic nodules, better evaluated on recent MRI.Mild fat stranding about the pancreas and second and third portion of duodenum, question mild pancreatitis or duodenitis. Cholelithiasis and sludge within the gallbladder without right upper quadrant inflammation.Slightly thick-walled urinary bladder, but decompressed. Question cystitis. Patient was admitted to ICU 10/25 s/p rough time with intubation for EGD due to bronchospasm and propofol  myoclonus during emergence but settled out and extubated after EGD/banding. 10/27 transferred to TRH service Patient is tolerating diet, hemoglobin overall stable of 7 g, at this time GI has recommended to discharge patient home on iron supplement PPI and his beta-blocker  Subjective: Seen and examined today Overnight on room air afebrile, VSS, Labs reviewed hemoglobin 7.5 from 7.4 stable BMP-although calcium  low 6.9 similar as before in the setting of alignment 2.5  Discharge Diagnoses:   Upper GI bleed Esophageal varices History of esophageal and gastric ulcers Normocytic anemia due to liver cirrhosis and acute GI blood loss Duodenopathy Abnormal duodenal/pancreatic findings on CT  angio: GI following underwent banding some varices but not clear bleeding.  Some gastric ulceration also no obvious place to intervene overall has stabilized. Continue to trend hemoglobin-which has been stable.  Seen by GI this morning, patient tolerated diet, AFP normal, MDF 26.9 on admission with good prognosis, plans for outpatient respiratory check as outpatient continue twice daily PPI which he has 3 refills on and okay to discharge home.  Patient is eager to go home.  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  Monitor platelet and INR.  Alcohol abuse at risk of withdrawal: Continue thiamine  folate CIWA protocol  Hypocalcemia: In the setting of hypoalbuminemia.  Monitor  AGMA Lactic acidosis: In the setting of liver cirrhosis upper GI bleed.  Alcoholic liver cirrhosis with portal hypertension/splenomegaly/gastroesophageal varices, hepatic nodules: Continue to encourage alcohol cessation.  GI following  Chronic pain/lumbar issues: Continue multimodal pain management-baclofen  and Norco gabapentin   Peripheral neuropathy Thiamine  deficiency: Replace  Asthma Stable  DVT prophylaxis: SCDs Start: 09/24/24 2334 Code Status:   Code Status: Full Code Family Communication: plan of care discussed with patient at bedside. Patient status is: Remains hospitalized because of severity of illness Level of care: Med-Surg   Dispo: The patient is from: home            Anticipated disposition: home Objective: Vitals last 24 hrs: Vitals:   09/26/24 1513 09/26/24 1700 09/26/24 2100 09/27/24 0751  BP:  109/76 106/79 131/80  Pulse:  (!) 102 (!) 104 (!) 101  Resp:  16 16 16   Temp: 98.1 F (36.7 C) 98.1 F (36.7 C) 98.1 F (36.7 C) 98.4 F (36.9 C)   TempSrc: Oral Oral Axillary Oral  SpO2:  97% 95% 97%  Weight:      Height:        Physical Examination: General exam: alert awake, oriented, older than stated age HEENT:Oral mucosa moist, Ear/Nose WNL grossly Respiratory system: Bilaterally clear BS,no use of accessory muscle Cardiovascular system: S1 & S2 +, No JVD. Gastrointestinal system: Abdomen soft,NT,ND, BS+ Nervous System: Alert, awake, moving all extremities,and following commands. Extremities: extremities warm, leg edema negSkin: Warm, no rashes MSK: Normal muscle bulk,tone, power

## 2024-09-27 NOTE — TOC CM/SW Note (Signed)
 Transition of Care [facility]) - Inpatient Brief Assessment   Patient Details  Name: Shane Holmes MRN: [id number] Date of Birth: July 26, 1986  [facility] CM/SW Contact:    Lauraine FORBES Saa, LCSWA Phone Number: 09/27/2024, 9:17 AM   Clinical Narrative:  9:17 AM Per chart review, patient resides at home with significant other. Patient has a PCP and insurance. Patient does not have SNF history. Patient has HH history with [facility]. Patient has DME ([facility], [facility]) history. Patient's preferred pharmacy's are [facility] and [facility] [phone] [place]. No TOC needs identified at this time. TOC will continue to follow.  Transition of Care Asessment: Insurance and Status: Insurance coverage has been reviewed Patient has primary care physician: Yes Home environment has been reviewed: Private Residence Prior level of function:: N/A Prior/Current Home Services: No current home services Social Drivers of Health Review: SDOH reviewed no interventions necessary Readmission risk has been reviewed: Yes (Currently Yellow 18%) Transition of care needs: no transition of care needs at this time

## 2024-09-27 NOTE — Plan of Care (Signed)
  Problem: Education: Goal: Ability to describe self-care measures that may prevent or decrease complications (Diabetes Survival Skills Education) will improve Outcome: Progressing Goal: Individualized Educational Video(s) Outcome: Progressing   Problem: Coping: Goal: Ability to adjust to condition or change in health will improve Outcome: Progressing   Problem: Fluid Volume: Goal: Ability to maintain a balanced intake and output will improve Outcome: Progressing   Problem: Health Behavior/Discharge Planning: Goal: Ability to identify and utilize available resources and services will improve Outcome: Progressing Goal: Ability to manage health-related needs will improve Outcome: Progressing   Problem: Metabolic: Goal: Ability to maintain appropriate glucose levels will improve Outcome: Progressing   Problem: Nutritional: Goal: Maintenance of adequate nutrition will improve Outcome: Progressing Goal: Progress toward achieving an optimal weight will improve Outcome: Progressing   Problem: Skin Integrity: Goal: Risk for impaired skin integrity will decrease Outcome: Progressing   Problem: Education: Goal: Knowledge of General Education information will improve Description: Including pain rating scale, medication(s)/side effects and non-pharmacologic comfort measures Outcome: Progressing   Problem: Tissue Perfusion: Goal: Adequacy of tissue perfusion will improve Outcome: Progressing

## 2024-09-27 NOTE — Plan of Care (Signed)

## 2024-09-27 NOTE — Progress Notes (Addendum)
 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 Feels okay. Motivated to stop drinking Etoh  Multiple hepatic nodules on CT angio Normal AFP  Duodenopathy ( EGD findings).   Abnormal duodenal / pancreatic findings on CT angio.  CT angio demonstrated mild fat stranding about the pancreas and second and third portion of duodenum, question mild pancreatitis or duodenitis.  Lipase normal, no abdominal pain so radiologic findings possibly secondary to duodenopathy found on EGD?   PUD Gastric ulcers on EGD in Feb 2025 and again on EGD this admission. Not biopsies either time. Etiology? Doesn't appear that he takes NSAIDs. H.pylori status unknown  Cholelithiasis  GERD Long segment Barrett's esophagus Hiatal hernia   Asthma  Type 2 DM   Principal Problem:   Acute upper GI bleed Active Problems:   ABLA (acute blood loss anemia)   Secondary esophageal varices with bleeding (HCC)   PLAN   --Continue BID Pantoprazole  --Consider checking for H.pylori as outpatient --Ok for discharge from GI standpoint. Our office will contact him him with office follow up.  --Reiterated that he must completely abstain from alcohol --Needs additional labs to complete the hepatic serologic workup of cirrhosis. I inserted what I could find below. Consider updating viral hep studies.  --Carvediol 3.25 mg daily upon discharge. Consider increasing dose at time of hospital follow up   Subjective   No physical complaints. Wants to go home  Objective   GI Studies:   EGD 10/25 -- Grade II and large  (> 5 mm) esophageal varices. Completely eradicated. Banded. - Non-bleeding gastric ulcers with pigmented material.  - Portal hypertensive gastropathy.  - Congested duodenal mucosa.  - No specimens collected.   Component     Latest Ref Rng 01/24/2021 01/25/2021 08/27/2022  Iron     50 - 180 mcg/dL   84   TIBC     749 - 574 mcg/dL (calc)   581   %SAT     20 - 48 % (calc)   20   Ferritin     38 - 380 ng/mL   37 (L)   IgG (Immunoglobin G), Serum     603 - 1,613 mg/dL 8,942     Ceruloplasmin     16.0 - 31.0 mg/dL 81.4     Mitochondrial M2 Ab, IgG     0.0 - 20.0 Units <20.0     F-Actin IgG     0 - 19 Units 12     Anti Nuclear Antibody (ANA)     Negative   Positive !      Legend: ! Abnormal (L) Low   Recent Labs    09/25/24 2305 09/26/24 0339 09/27/24 0453  WBC 7.2 6.7 7.7  HGB 7.0* 7.4* 7.5*  HCT 20.8* 21.2* 22.6*  MCV 86.0 86.5 89.7  PLT 46* 49* 57*   No results for input(s): FOLATE, VITAMINB12, FERRITIN, TIBC, IRONPCTSAT in the last 72 hours. Recent Labs    09/24/24 2126 09/26/24 0339 09/27/24 0453  NA 135 139 137  K 3.5 3.3* 4.0  CL 93* 100 101  CO2 15* 26  26  GLUCOSE 74 90 98  BUN 11 10 13   CREATININE 0.79 0.60* 0.74  CALCIUM  7.9* 6.7* 6.9*   Recent Labs    09/24/24 2126 09/26/24 0339  PROT 7.2 5.8*  ALBUMIN 3.1* 2.5*  AST 76* 53*  ALT 14 12  ALKPHOS 97 64  BILITOT 4.8* 3.7*  BILIDIR  --  1.9*  IBILI  --  1.8*    Imaging:  CT ANGIO GI BLEED CLINICAL DATA:  Hematemesis rectal bleeding  EXAM: CTA ABDOMEN AND PELVIS WITHOUT AND WITH CONTRAST  TECHNIQUE: Multidetector CT imaging of the abdomen and pelvis was performed using the standard protocol during bolus administration of intravenous contrast. Multiplanar reconstructed images and MIPs were obtained and reviewed to evaluate the vascular anatomy.  RADIATION DOSE REDUCTION: This exam was performed according to the departmental dose-optimization program which includes  automated exposure control, adjustment of the mA and/or kV according to patient size and/or use of iterative reconstruction technique.  CONTRAST:  75mL OMNIPAQUE  IOHEXOL  350 MG/ML SOLN  COMPARISON:  MRI 01/27/2024 CT 01/27/2024, 06/19/2022  FINDINGS: VASCULAR  Aorta: Normal caliber aorta without aneurysm, dissection, vasculitis or significant stenosis. Mild atherosclerosis.  Celiac: Patent without evidence of aneurysm, dissection, vasculitis or significant stenosis.  SMA: Patent without evidence of aneurysm, dissection, vasculitis or significant stenosis.  Renals: Both renal arteries are patent without evidence of aneurysm, dissection, vasculitis, fibromuscular dysplasia or significant stenosis.  IMA: Patent without evidence of aneurysm, dissection, vasculitis or significant stenosis.  Inflow: Patent without evidence of aneurysm, dissection, vasculitis or significant stenosis.  Proximal Outflow: Bilateral common femoral and visualized portions of the superficial and profunda femoral arteries are patent without evidence of aneurysm, dissection, vasculitis or significant stenosis.  Veins: Patent portal and splenic veins. IVC is grossly unremarkable. Recanalized paraumbilical vein. Small gastroesophageal varices.  Review of the MIP images confirms the above findings.  NON-VASCULAR  Lower chest: Lung bases are clear. Small hiatal hernia containing fluid, chronic.  Hepatobiliary: Liver cirrhosis. Multiple hepatic nodules. Sludge and stones in the gallbladder. No biliary dilatation.  Pancreas: Minimal fat stranding.  No ductal dilatation  Spleen: Enlarged with AP measurement of 15 cm, previously 14.5 cm  Adrenals/Urinary Tract: Adrenal glands are normal. Kidneys show no hydronephrosis. Slightly thick walled urinary bladder.  Stomach/Bowel: Stomach within normal limits. Soft tissue stranding at the second and third portion of duodenum with trace fluid. No acute bowel  wall thickening. Negative appendix  Lymphatic: No suspicious lymph nodes  Reproductive: Negative prostate  Other: Negative for free air. No ascites. Trace stranding and thickening in the anterior pararenal spaces.  Musculoskeletal: No acute osseous abnormality. Chronic bilateral pars defect at L5  IMPRESSION: 1. Negative for acute aortic dissection or aneurysm. No evidence for intraluminal extravasation of contrast to suggest active GI bleeding on this exam. 2. Liver cirrhosis with evidence of portal hypertension including splenomegaly and small gastroesophageal varices. 3. Multiple hepatic nodules, better evaluated on recent MRI. 4. Mild fat stranding about the pancreas and second and third portion of duodenum, question mild pancreatitis or duodenitis. 5. Cholelithiasis and sludge within the gallbladder without right upper quadrant inflammation. 6. Slightly thick-walled urinary bladder, but decompressed. Question cystitis. 7. Aortic atherosclerosis.  Aortic Atherosclerosis (ICD10-I70.0).  Electronically Signed   By: Luke Bun M.D.   On: 09/24/2024 23:20    Scheduled inpatient medications:   baclofen   10 mg Oral BID   Chlorhexidine Gluconate Cloth  6 each Topical Daily   folic acid   1 mg Oral Daily   gabapentin   800 mg Oral TID   HYDROcodone -acetaminophen   1 tablet Oral TID   insulin  aspart  0-15 Units Subcutaneous Q4H   nicotine   21 mg Transdermal Daily   pantoprazole  (PROTONIX ) IV  40 mg Intravenous Q12H   polyethylene glycol  17 g Oral Daily   thiamine   100 mg Oral Daily   Continuous inpatient infusions:   cefTRIAXone  (ROCEPHIN )  IV Stopped (09/27/24 0031)   octreotide  (SANDOSTATIN ) 500 mcg in sodium chloride  0.9 % 250 mL (2 mcg/mL) infusion 50 mcg/hr (09/27/24 1001)   PRN inpatient medications: HYDROmorphone  (DILAUDID ) injection, ipratropium-albuterol , ondansetron  (ZOFRAN ) IV, mouth rinse  Vital signs in last 24 hours: Temp:  [98.1 F (36.7 C)-98.4 F  (36.9 C)] 98.4 F (36.9 C) (10/27 0751) Pulse Rate:  [101-104] 101 (10/27 0751) Resp:  [16] 16 (10/27 0751) BP: (106-131)/(76-80) 131/80 (10/27 0751) SpO2:  [95 %-97 %] 95 % (10/26 2100) Last BM Date : 09/26/24  Intake/Output Summary (Last 24 hours) at 09/27/2024 1035 Last data filed at 09/27/2024 0449 Gross per 24 hour  Intake 617.76 ml  Output --  Net 617.76 ml    Intake/Output from previous day: 10/26 0701 - 10/27 0700 In: 642.7 [I.V.:544; IV Piggyback:98.8] Out: -  Intake/Output this shift: No intake/output data recorded.   Physical Exam:  General: Alert male in NAD Heart:  Regular rate and rhythm.  Pulmonary: Normal respiratory effort. Bilateral rhonchi and wheezing Abdomen: Soft, nondistended, nontender. Normal bowel sounds. Extremities: No lower extremity edema  Neurologic: Alert and oriented Psych: Pleasant. Cooperative    LOS: 3 days   Vina Dasen ,NP 09/27/2024, 10:35 AM

## 2024-11-08 ENCOUNTER — Telehealth: Admitting: Nurse Practitioner

## 2024-11-08 DIAGNOSIS — J0140 Acute pansinusitis, unspecified: Secondary | ICD-10-CM

## 2024-11-08 MED ORDER — AMOXICILLIN-POT CLAVULANATE 875-125 MG PO TABS: 1.0000 | ORAL_TABLET | Freq: Two times a day (BID) | ORAL | 0 refills | Status: AC

## 2024-11-08 NOTE — Progress Notes (Signed)
 E-Visit for Sinus Problems  We are sorry that you are not feeling well.  Here is how we plan to help!  Based on what you have shared with me it looks like you have sinusitis.  Sinusitis is inflammation and infection in the sinus cavities of the head.  Based on your presentation I believe you most likely have Acute Bacterial Sinusitis.  This is an infection caused by bacteria and is treated with antibiotics. I have prescribed Augmentin  875mg /125mg  one tablet twice daily with food, for 7 days. You may use an oral decongestant such as Mucinex D or if you have glaucoma or high blood pressure use plain Mucinex. Saline nasal spray help and can safely be used as often as needed for congestion.  If you develop worsening sinus pain, fever or notice severe headache and vision changes, or if symptoms are not better after completion of antibiotic, please schedule an appointment with a health care provider.    Sinus infections are not as easily transmitted as other respiratory infection, however we still recommend that you avoid close contact with loved ones, especially the very young and elderly.  Remember to wash your hands thoroughly throughout the day as this is the number one way to prevent the spread of infection!  Home Care: Only take medications as instructed by your medical team. Complete the entire course of an antibiotic. Do not take these medications with alcohol. A steam or ultrasonic humidifier can help congestion.  You can place a towel over your head and breathe in the steam from hot water coming from a faucet. Avoid close contacts especially the very young and the elderly. Cover your mouth when you cough or sneeze. Always remember to wash your hands.  Get Help Right Away If: You develop worsening fever or sinus pain. You develop a severe head ache or visual changes. Your symptoms persist after you have completed your treatment plan.  Make sure you Understand these instructions. Will  watch your condition. Will get help right away if you are not doing well or get worse.  Your e-visit answers were reviewed by a board certified advanced clinical practitioner to complete your personal care plan.  Depending on the condition, your plan could have included both over the counter or prescription medications.  If there is a problem please reply  once you have received a response from your provider.  Your safety is important to us .  If you have drug allergies check your prescription carefully.    You can use MyChart to ask questions about today's visit, request a non-urgent call back, or ask for a work or school excuse for 24 hours related to this e-Visit. If it has been greater than 24 hours you will need to follow up with your provider, or enter a new e-Visit to address those concerns.  You will get an e-mail in the next two days asking about your experience.  I hope that your e-visit has been valuable and will speed your recovery. Thank you for using e-visits.  I have spent 5 minutes in review of e-visit questionnaire, review and updating patient chart, medical decision making and response to patient.   Lauraine Kitty, FNP

## 2024-11-17 ENCOUNTER — Ambulatory Visit: Admitting: Physician Assistant

## 2024-12-27 ENCOUNTER — Ambulatory Visit: Admitting: Physician Assistant

## 2025-01-03 ENCOUNTER — Telehealth: Admitting: Family Medicine

## 2025-01-03 DIAGNOSIS — K047 Periapical abscess without sinus: Secondary | ICD-10-CM

## 2025-01-03 MED ORDER — CLINDAMYCIN HCL 300 MG PO CAPS: 300.0000 mg | ORAL_CAPSULE | Freq: Three times a day (TID) | ORAL | 0 refills | Status: AC

## 2025-01-03 NOTE — Progress Notes (Signed)
 E-Visit for Dental Pain  We are sorry that you are not feeling well.  Here is how we plan to help!  Based on what you have shared with me in the questionnaire, it sounds like you have dental infection due to a cavity/broken tooth  Clindamycin  300mg  3 times a day for 7 days  It is imperative that you see a dentist within 10 days of this eVisit to determine the cause of the dental pain and be sure it is adequately treated  A toothache or tooth pain is caused when the nerve in the root of a tooth or surrounding a tooth is irritated. Dental (tooth) infection, decay, injury, or loss of a tooth are the most common causes of dental pain. Pain may also occur after an extraction (tooth is pulled out). Pain sometimes originates from other areas and radiates to the jaw, thus appearing to be tooth pain.Bacteria growing inside your mouth can contribute to gum disease and dental decay, both of which can cause pain. A toothache occurs from inflammation of the central portion of the tooth called pulp. The pulp contains nerve endings that are very sensitive to pain. Inflammation to the pulp or pulpitis may be caused by dental cavities, trauma, and infection.    HOME CARE:   For toothaches: Over-the-counter pain medications such as acetaminophen  or ibuprofen  may be used. Take these as directed on the package while you arrange for a dental appointment. Avoid very cold or hot foods, because they may make the pain worse. You may get relief from biting on a cotton ball soaked in oil of cloves. You can get oil of cloves at most drug stores.  For jaw pain:  Aspirin may be helpful for problems in the joint of the jaw in adults. If pain happens every time you open your mouth widely, the temporomandibular joint (TMJ) may be the source of the pain. Yawning or taking a large bite of food may worsen the pain. An appointment with your doctor or dentist will help you find the cause.     GET HELP RIGHT AWAY IF:  You have  a high fever or chills If you have had a recent head or face injury and develop headache, light headedness, nausea, vomiting, or other symptoms that concern you after an injury to your face or mouth, you could have a more serious injury in addition to your dental injury. A facial rash associated with a toothache: This condition may improve with medication. Contact your doctor for them to decide what is appropriate. Any jaw pain occurring with chest pain: Although jaw pain is most commonly caused by dental disease, it is sometimes referred pain from other areas. People with heart disease, especially people who have had stents placed, people with diabetes, or those who have had heart surgery may have jaw pain as a symptom of heart attack or angina. If your jaw or tooth pain is associated with lightheadedness, sweating, or shortness of breath, you should see a doctor as soon as possible. Trouble swallowing or excessive pain or bleeding from gums: If you have a history of a weakened immune system, diabetes, or steroid use, you may be more susceptible to infections. Infections can often be more severe and extensive or caused by unusual organisms. Dental and gum infections in people with these conditions may require more aggressive treatment. An abscess may need draining or IV antibiotics, for example.  MAKE SURE YOU   Understand these instructions. Will watch your condition. Will get help  right away if you are not doing well or get worse.  Thank you for choosing an e-visit.  Your e-visit answers were reviewed by a board certified advanced clinical practitioner to complete your personal care plan. Depending upon the condition, your plan could have included both over the counter or prescription medications.  Please review your pharmacy choice. Make sure the pharmacy is open so you can pick up prescription now. If there is a problem, you may contact your provider through [employer] and have the  prescription routed to another pharmacy.  Your safety is important to us . If you have drug allergies check your prescription carefully.   For the next 24 hours you can use MyChart to ask questions about today's visit, request a non-urgent call back, or ask for a work or school excuse. You will get an email in the next two days asking about your experience. I hope that your e-visit has been valuable and will speed your recovery.  I have spent 5 minutes in review of e-visit questionnaire, review and updating patient chart, medical decision making and response to patient.   Chiquita CHRISTELLA Barefoot, NP

## 2025-09-22 ENCOUNTER — Ambulatory Visit: Admitting: Neurology
